# Patient Record
Sex: Female | Born: 1955 | Race: White | Hispanic: No | Marital: Married | Smoking: Former smoker
Health system: Southern US, Community
[De-identification: ages and names within clinical notes are randomized; demographics above are authoritative.]

## PROBLEM LIST (undated history)

## (undated) DIAGNOSIS — O039 Complete or unspecified spontaneous abortion without complication: Secondary | ICD-10-CM

## (undated) DIAGNOSIS — K5190 Ulcerative colitis, unspecified, without complications: Secondary | ICD-10-CM

## (undated) DIAGNOSIS — F32A Depression, unspecified: Secondary | ICD-10-CM

## (undated) DIAGNOSIS — F419 Anxiety disorder, unspecified: Secondary | ICD-10-CM

## (undated) DIAGNOSIS — K5090 Crohn's disease, unspecified, without complications: Secondary | ICD-10-CM

## (undated) DIAGNOSIS — E785 Hyperlipidemia, unspecified: Secondary | ICD-10-CM

## (undated) DIAGNOSIS — F329 Major depressive disorder, single episode, unspecified: Secondary | ICD-10-CM

## (undated) HISTORY — DX: Crohn's disease, unspecified, without complications: K50.90

## (undated) HISTORY — PX: CARDIAC SURGERY: SHX584

## (undated) HISTORY — PX: TUBAL LIGATION: SHX77

## (undated) HISTORY — DX: Major depressive disorder, single episode, unspecified: F32.9

## (undated) HISTORY — DX: Depression, unspecified: F32.A

## (undated) HISTORY — DX: Ulcerative colitis, unspecified, without complications: K51.90

## (undated) HISTORY — DX: Hyperlipidemia, unspecified: E78.5

## (undated) HISTORY — DX: Anxiety disorder, unspecified: F41.9

## (undated) HISTORY — DX: Complete or unspecified spontaneous abortion without complication: O03.9

---
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
## 2004-07-29 ENCOUNTER — Emergency Department: Payer: Self-pay | Admitting: Emergency Medicine

## 2006-07-24 ENCOUNTER — Encounter: Payer: Self-pay | Admitting: Family Medicine

## 2006-07-24 LAB — CONVERTED CEMR LAB
Cholesterol: 223 mg/dL
HDL: 76 mg/dL
LDL Cholesterol: 127 mg/dL
Pap Smear: NORMAL
Triglycerides: 98 mg/dL

## 2007-09-15 ENCOUNTER — Ambulatory Visit: Payer: Self-pay | Admitting: Family Medicine

## 2007-09-15 ENCOUNTER — Encounter: Admission: RE | Admit: 2007-09-15 | Discharge: 2007-09-15 | Payer: Self-pay | Admitting: Family Medicine

## 2007-09-20 ENCOUNTER — Encounter: Payer: Self-pay | Admitting: Family Medicine

## 2007-09-20 DIAGNOSIS — F411 Generalized anxiety disorder: Secondary | ICD-10-CM | POA: Insufficient documentation

## 2007-09-20 DIAGNOSIS — G4700 Insomnia, unspecified: Secondary | ICD-10-CM | POA: Insufficient documentation

## 2007-12-28 ENCOUNTER — Ambulatory Visit: Payer: Self-pay | Admitting: Family Medicine

## 2008-06-10 ENCOUNTER — Ambulatory Visit: Payer: Self-pay | Admitting: Family Medicine

## 2009-04-25 ENCOUNTER — Ambulatory Visit: Payer: Self-pay | Admitting: Family Medicine

## 2009-04-25 DIAGNOSIS — Z780 Asymptomatic menopausal state: Secondary | ICD-10-CM | POA: Insufficient documentation

## 2009-04-26 LAB — CONVERTED CEMR LAB
BUN: 14 mg/dL (ref 6–23)
CO2: 25 meq/L (ref 19–32)
Calcium: 10.1 mg/dL (ref 8.4–10.5)
Chloride: 97 meq/L (ref 96–112)
Cholesterol: 226 mg/dL — ABNORMAL HIGH (ref 0–200)
Creatinine, Ser: 0.75 mg/dL (ref 0.40–1.20)
HDL: 80 mg/dL (ref >39)
Total Bilirubin: 0.7 mg/dL (ref 0.3–1.2)
Total CHOL/HDL Ratio: 2.8
Triglycerides: 50 mg/dL (ref <150)
VLDL: 10 mg/dL (ref 0–40)

## 2009-04-30 ENCOUNTER — Encounter: Admission: RE | Admit: 2009-04-30 | Discharge: 2009-04-30 | Payer: Self-pay | Admitting: Family Medicine

## 2009-06-14 ENCOUNTER — Encounter: Payer: Self-pay | Admitting: Family Medicine

## 2009-07-11 ENCOUNTER — Encounter: Payer: Self-pay | Admitting: Family Medicine

## 2009-07-11 LAB — HM COLONOSCOPY

## 2009-07-30 ENCOUNTER — Ambulatory Visit: Payer: Self-pay | Admitting: Family Medicine

## 2009-07-30 DIAGNOSIS — K5090 Crohn's disease, unspecified, without complications: Secondary | ICD-10-CM | POA: Insufficient documentation

## 2009-07-30 DIAGNOSIS — R519 Headache, unspecified: Secondary | ICD-10-CM | POA: Insufficient documentation

## 2009-07-30 DIAGNOSIS — R51 Headache: Secondary | ICD-10-CM | POA: Insufficient documentation

## 2009-08-02 ENCOUNTER — Ambulatory Visit: Payer: Self-pay | Admitting: Family Medicine

## 2009-08-20 ENCOUNTER — Encounter: Payer: Self-pay | Admitting: Family Medicine

## 2009-09-28 ENCOUNTER — Encounter: Payer: Self-pay | Admitting: Family Medicine

## 2009-10-29 ENCOUNTER — Encounter: Payer: Self-pay | Admitting: Family Medicine

## 2009-10-30 HISTORY — PX: EYE SURGERY: SHX253

## 2010-02-26 ENCOUNTER — Encounter: Payer: Self-pay | Admitting: Family Medicine

## 2010-04-05 ENCOUNTER — Ambulatory Visit: Payer: Self-pay | Admitting: Family Medicine

## 2010-04-05 DIAGNOSIS — H8110 Benign paroxysmal vertigo, unspecified ear: Secondary | ICD-10-CM | POA: Insufficient documentation

## 2010-04-29 ENCOUNTER — Ambulatory Visit: Payer: Self-pay | Admitting: Family Medicine

## 2010-04-30 LAB — CONVERTED CEMR LAB
Albumin: 4.7 g/dL (ref 3.5–5.2)
Alkaline Phosphatase: 75 units/L (ref 39–117)
CO2: 28 meq/L (ref 19–32)
Calcium: 9.6 mg/dL (ref 8.4–10.5)
Chloride: 99 meq/L (ref 96–112)
Cholesterol: 179 mg/dL (ref 0–200)
Glucose, Bld: 91 mg/dL (ref 70–99)
LDL Cholesterol: 102 mg/dL — ABNORMAL HIGH (ref 0–99)
Potassium: 4.3 meq/L (ref 3.5–5.3)
Sodium: 138 meq/L (ref 135–145)
Total Protein: 7.4 g/dL (ref 6.0–8.3)
Triglycerides: 61 mg/dL (ref <150)

## 2010-05-03 ENCOUNTER — Telehealth: Payer: Self-pay | Admitting: Family Medicine

## 2010-05-08 ENCOUNTER — Encounter: Admission: RE | Admit: 2010-05-08 | Discharge: 2010-05-08 | Payer: Self-pay | Admitting: Family Medicine

## 2010-07-07 ENCOUNTER — Encounter: Payer: Self-pay | Admitting: Family Medicine

## 2010-07-14 LAB — CONVERTED CEMR LAB
ALT: 13 units/L (ref 0–35)
AST: 20 units/L (ref 0–37)
Basophils Absolute: 0 10*3/uL (ref 0.0–0.1)
Basophils Relative: 1 % (ref 0–1)
CO2: 26 meq/L (ref 19–32)
Calcium: 9.8 mg/dL (ref 8.4–10.5)
Chloride: 97 meq/L (ref 96–112)
Creatinine, Ser: 0.75 mg/dL (ref 0.40–1.20)
Glucose, Urine, Semiquant: NEGATIVE
Hemoglobin: 13.5 g/dL (ref 12.0–15.0)
Lymphocytes Relative: 34 % (ref 12–46)
MCHC: 32.8 g/dL (ref 30.0–36.0)
Monocytes Absolute: 0.5 10*3/uL (ref 0.1–1.0)
Neutro Abs: 2.2 10*3/uL (ref 1.7–7.7)
Nitrite: NEGATIVE
Platelets: 230 10*3/uL (ref 150–400)
RDW: 12.9 % (ref 11.5–15.5)
Sodium: 136 meq/L (ref 135–145)
Specific Gravity, Urine: 1.01
Total Bilirubin: 0.7 mg/dL (ref 0.3–1.2)
Total Protein: 7.3 g/dL (ref 6.0–8.3)
WBC Urine, dipstick: NEGATIVE
pH: 6.5

## 2010-07-18 NOTE — Progress Notes (Signed)
Summary: Pt canceled nutrition referral.   ---- Converted from flag ---- ---- 05/03/2010 2:44 PM, Otho Ket wrote: Pt called and wants to CANCEL her referral for Nutrition... Thanks, Anderson Malta ------------------------------

## 2010-07-18 NOTE — Letter (Signed)
Summary: [facility]   Imported By: Edmonia James 11/05/2009 13:13:20  _____________________________________________________________________  External Attachment:    Type:   Image     Comment:   External Document

## 2010-07-18 NOTE — Letter (Signed)
Summary: [facility]   Imported By: Edmonia James 10/11/2009 09:59:59  _____________________________________________________________________  External Attachment:    Type:   Image     Comment:   External Document

## 2010-07-18 NOTE — Letter (Signed)
Summary: Meade Maw MD  Meade Maw MD   Imported By: Edmonia James 12/07/2009 08:02:42  _____________________________________________________________________  External Attachment:    Type:   Image     Comment:   External Document

## 2010-07-18 NOTE — Assessment & Plan Note (Signed)
Summary: HAs   Vital Signs:  Patient profile:   55 year old female Height:      69 inches Weight:      129 pounds BMI:     19.12 O2 Sat:      100 % on Room air Temp:     98.0 degrees F oral Pulse rate:   69 / minute BP sitting:   106 / 63  (left arm) Cuff size:   regular  Vitals Entered By: Sherlean Foot CMA (July 30, 2009 8:30 AM)  O2 Flow:  Room air CC: HA's since being Dx'd w/ Crohns   Primary Care Provider:  Beatrice Lecher MD  CC:  HA's since being Dx'd w/ Crohns.  History of Present Illness: 55 yo WF presents for HA with lightheadedness and nausea. Nausea started this am, not noticed before.  She is taking Tylenol 1 gram every 6 hrs which helps some. She took Flexeril last night d/t neck discomfort.  She is on an a new medicine for Crohns since her colonoscopy.  The HAs started right after the colonoscopy.   She has some pain with neck ROM.  No numbness or tingling.  She used to see a chiropractor for neck pain years ago.  No previous hx of migraines.  She is trying to drink plenty of water.  She had some fevers, chills three days ago which caused her to leave work early. Has the dry heaves.  Her stools are still bloody, lose and mucously.        Current Medications (verified): 1)  Fish Oil 1200 Mg  Caps (Omega-3 Fatty Acids) .... Take Two By Mouth Daily 2)  Caltrate 600+d 600-400 Mg-Unit  Tabs (Calcium Carbonate-Vitamin D) .... Take Two By Mouth Daily 3)  B Complex   Caps (B Complex Vitamins) .... Take 1 Tablet By Mouth Once A Day 4)  Lialda 1.2 Gm Tbec (Mesalamine) .... Take 2 Tabs By Mouth Once Daily  Allergies (verified): No Known Drug Allergies  Past History:  Past Medical History: Elevated cholesterol One miscarriage.  Anxiety/derpression - On Prozac previously  Dr Laverta Baltimore - GI  Family History: Reviewed history from 06/10/2008 and no changes required. mother - alive with memory problems and abdominal problems father - alive - vertigo sister  alive and healthy  Social History: Reviewed history from 04/25/2009 and no changes required. [place]. Married to [employer] with 3 adult children.  Lives with her husband.  Former Nurse, mental health, quit at age 32.  Alcohol use-yes 7 drinks a week, wine.   Drug use-no Regular exercise-no  Review of Systems      See HPI  Physical Exam  General:  In no acute distress; appropriate and cooperative throughout examination, appears fatigued. Head:  normocephalic and atraumatic.   Eyes:  sclera non icteric Nose:  no nasal discharge.   Mouth:  good dentition and pharynx pink and moist.   Neck:  limited active SB and rotation  Lungs:  Normal respiratory effort, chest expands symmetrically. Lungs are clear to auscultation, no crackles or wheezes. Heart:  Normal rate and regular rhythm. Abdomen:  abdomen soft and non-tender, no HSM.  NABS Pulses:  2+ radial pulses Extremities:  no E/C/C Neurologic:  alert & oriented X3 and gait normal.   Skin:  slight facial pallor.  no jaundice Cervical Nodes:  No lymphadenopathy noted Psych:  flat affect.     Impression & Recommendations:  Problem # 1:  HEADACHE (ICD-784.0) Assessment New 3 wks of  HA that began with mesalamine use.  Likely to be SE from the medicine.  She has associated nausea that may be from the HA, the medicine or a viral illness.  Will treat her symptoms w/ odansetron, lots of fluids and Tylenol as needed.  Try not to exceed 3 g/ day.  If not improved in 72 hrs, suggest she call Dr Laverta Baltimore (GI) to discuss this SE.     Ondansteron as needed for nausea. Diazepram as needed at night for neck pain.  Problem # 2:  CROHN'S DISEASE (ICD-555.9) New diagnosis. Sees Dr. Laverta Baltimore (GI) Started on Lialda 3 weeks ago following Colonoscopy. Experiencing diarrhea and nausea. Has mucous and blood in stools.  Clear liquid diet today. Advance foods slowly, bland diet. Will f/u with Dr. Laverta Baltimore after Thursday regarding possible side  effects of Lialda.   Complete Medication List: 1)  Fish Oil 1200 Mg Caps (Omega-3 fatty acids) .... Take two by mouth daily 2)  Caltrate 600+d 600-400 Mg-unit Tabs (Calcium carbonate-vitamin d) .... Take two by mouth daily 3)  B Complex Caps (B complex vitamins) .... Take 1 tablet by mouth once a day 4)  Lialda 1.2 Gm Tbec (Mesalamine) .... Take 2 tabs by mouth once daily 5)  Ondansetron 8 Mg Tbdp (Ondansetron) .Marland Kitchen.. 1 tab by mouth three times a day as needed nausea 6)  Diazepam 2 Mg Tabs (Diazepam) .Marland Kitchen.. 1-2 tabs by mouth q hs as needed neck pain  Patient Instructions: 1)  Use Odansetron as needed for nausea. 2)  Stick to a clear liquid diet today.  Advance slowly as nausea improves. 3)  Use Diazepam for neck pain at night.  It will make you drowsy. 4)  Call GI doctor if Headaches are not resolved by Thursday.  Likely to be a medication side effect. Prescriptions: DIAZEPAM 2 MG TABS (DIAZEPAM) 1-2 tabs by mouth q hs as needed neck pain  #12 x 0   Entered and Authorized by:   Loyal Gambler DO   Signed by:   Loyal Gambler DO on 07/30/2009   Method used:   Printed then faxed to ...       [facility] [phone]* (retail)       [place], [place]  [address]       Ph: [phone]       Fax: [phone]   RxID:   [id number] ONDANSETRON 8 MG TBDP (ONDANSETRON) 1 tab by mouth three times a day as needed nausea  #24 x 0   Entered and Authorized by:   Loyal Gambler DO   Signed by:   Loyal Gambler DO on 07/30/2009   Method used:   Electronically to        [facility] [phone]* (retail)       [place], [place]  [address]       Ph: [phone]       Fax: [phone]   RxID:   [id number]

## 2010-07-18 NOTE — Letter (Signed)
Summary: [facility]   Imported By: Edmonia James 03/07/2010 12:09:15  _____________________________________________________________________  External Attachment:    Type:   Image     Comment:   External Document

## 2010-07-18 NOTE — Assessment & Plan Note (Signed)
Summary: CPE   Vital Signs:  Patient profile:   55 year old female Height:      69 inches Weight:      121 pounds Pulse rate:   91 / minute BP sitting:   115 / 59  (right arm) Cuff size:   regular  Vitals Entered By: Isaias Cowman CMA, Deborra Medina) (April 29, 2010 8:22 AM)  Contraindications/Deferment of Procedures/Staging:    Test/Procedure: FLU VAX    Reason for deferment: patient declined  CC: CPE, C-V Risk Management   Primary Care Kendarious Gudino:  Beatrice Lecher MD  CC:  CPE and C-V Risk Management.  History of Present Illness: Here for CPE. She wouldl like a nutrition referral for her recent weight loss with her crohns hx  Says had a normal EKG at the ED in May 2011.  Had a Tdap whiel there. Was there after her BP dropped too low from meds she was taking post eye surgery.   Cardiovascular Risk History:      Negative major cardiovascular risk factors include female age less than 56 years old, no history of diabetes, no history of hypertension, negative family history for ischemic heart disease, and non-tobacco-user status.        Further assessment for target organ damage reveals no history of ASHD, stroke/TIA, or peripheral vascular disease.    Current Medications (verified): 1)  Fish Oil 1200 Mg  Caps (Omega-3 Fatty Acids) .... Take Two By Mouth Daily 2)  Caltrate 600+d 600-400 Mg-Unit  Tabs (Calcium Carbonate-Vitamin D) .... Take Two By Mouth Daily 3)  B Complex   Caps (B Complex Vitamins) .... Take 1 Tablet By Mouth Once A Day 4)  Lialda 1.2 Gm Tbec (Mesalamine) .... Take 2 Tabs By Mouth Once Daily 5)  Promethazine Hcl 25 Mg Tabs (Promethazine Hcl) .... One By Mouth Every 4 Hours As Needed Nausea.  Allergies (verified): No Known Drug Allergies  Comments:  Nurse/Medical Assistant: The patient's medications and allergies were reviewed with the patient and were updated in the Medication and Allergy Lists. Isaias Cowman CMA, Deborra Medina) (April 29, 2010 8:22  AM)  Past History:  Family History: Last updated: 06/10/2008 mother - alive with memory problems and abdominal problems father - alive - vertigo sister alive and healthy  Social History: Last updated: 04/25/2009 [place]. Married to [employer] with 3 adult children.  Lives with her husband.  Former Nurse, mental health, quit at age 81.  Alcohol use-yes 7 drinks a week, wine.   Drug use-no Regular exercise-no  Past Medical History: Elevated cholesterol One miscarriage.  Anxiety/derpression - On Prozac previously Crohns Dx Dr Laverta Baltimore - GI  Past Surgical History: Repair of ?ASD 10/1962 tonsillectomy 1965 D & C after miscarriage in 1982 Repair of detached retina surgery, left eye 10/30/2009  Review of Systems  The patient denies anorexia, fever, weight loss, weight gain, vision loss, decreased hearing, hoarseness, chest pain, syncope, dyspnea on exertion, peripheral edema, prolonged cough, headaches, hemoptysis, abdominal pain, melena, hematochezia, severe indigestion/heartburn, hematuria, incontinence, genital sores, muscle weakness, suspicious skin lesions, transient blindness, difficulty walking, depression, unusual weight change, abnormal bleeding, enlarged lymph nodes, and breast masses.    Physical Exam  General:  Well-developed,well-nourished,in no acute distress; alert,appropriate and cooperative throughout examination Head:  Normocephalic and atraumatic without obvious abnormalities. No apparent alopecia or balding. Eyes:  No corneal or conjunctival inflammation noted. EOMI. Perrla.  Ears:  External ear exam shows no significant lesions or deformities.  Otoscopic examination reveals clear canals, tympanic membranes  are intact bilaterally without bulging, retraction, inflammation or discharge. Hearing is grossly normal bilaterally. Nose:  External nasal examination shows no deformity or inflammation.  Mouth:  Oral mucosa and oropharynx without lesions or  exudates.  Teeth in good repair. Neck:  No deformities, masses, or tenderness noted. Chest Wall:  Prominant left chest rib.   Breasts:  No mass, nodules, thickening, tenderness, bulging, retraction, inflamation, nipple discharge or skin changes noted.   Lungs:  Normal respiratory effort, chest expands symmetrically. Lungs are clear to auscultation, no crackles or wheezes. Heart:  Normal rate and regular rhythm. S1 and S2 normal without gallop, murmur, click, rub or other extra sounds. Abdomen:  Bowel sounds positive,abdomen soft and non-tender without masses, organomegaly or hernias noted. Msk:  No deformity or scoliosis noted of thoracic or lumbar spine.   Pulses:  R and L carotid,radial,dorsalis pedis and posterior tibial pulses are full and equal bilaterally Extremities:  No clubbing, cyanosis, edema, or deformity noted with normal full range of motion of all joints.   Neurologic:  No cranial nerve deficits noted. Station and gait are normal. DTRs are symmetrical throughout. Sensory, motor and coordinative functions appear intact. Skin:  Insicion over chest. no rashes.   Cervical Nodes:  No lymphadenopathy noted Axillary Nodes:  No palpable lymphadenopathy Psych:  Cognition and judgment appear intact. Alert and cooperative with normal attention span and concentration. No apparent delusions, illusions, hallucinations   Impression & Recommendations:  Problem # 1:  EXAMINATION, ROUTINE MEDICAL (ICD-V70.0) Examis normal. See orders below. She is very think and will arrange a nutritionist to work with her with her diet and her crohns.  Encourage regular exercise adn healthy diet.  Encourage daily calcium.  Orders: T-Mammography Bilateral Screening (86761) T-Dual DXA Bone Density/ Axial (95093) T-Comprehensive Metabolic Panel (26712-45809) T-Lipid Profile (98338-25053) T-TSH (97673-41937)  Complete Medication List: 1)  Fish Oil 1200 Mg Caps (Omega-3 fatty acids) .... Take two by mouth  daily 2)  Caltrate 600+d 600-400 Mg-unit Tabs (Calcium carbonate-vitamin d) .... Take two by mouth daily 3)  B Complex Caps (B complex vitamins) .... Take 1 tablet by mouth once a day 4)  Lialda 1.2 Gm Tbec (Mesalamine) .... Take 2 tabs by mouth once daily 5)  Promethazine Hcl 25 Mg Tabs (Promethazine hcl) .... One by mouth every 4 hours as needed nausea.  Other Orders: Nutrition Referral (Nutrition)  Cardiovascular Risk Assessment/Plan:      The patient's hypertensive risk group is category A: No risk factors and no target organ damage.  Her calculated 10 year risk of coronary heart disease is 2 %.  Today's blood pressure is 115/59.     Patient Instructions: 1)  WE will call you with your lab results.  2)  We will call you with your nutrition referral.  3)  It is important that you exercise reguarly at least 20 minutes 5 times a week. If you develop chest pain, have severe difficulty breathing, or feel very tired, stop exercising immediately and seek medical attention.  4)  Take calcium +vitamin D daily.    Orders Added: 1)  T-Mammography Bilateral Screening [90240] 2)  T-Dual DXA Bone Density/ Axial [77080] 3)  T-Comprehensive Metabolic Panel [97353-29924] 4)  T-Lipid Profile [80061-22930] 5)  T-TSH [26834-19622] 6)  Nutrition Referral [Nutrition] 7)  Est. Patient age 86-64 [99396]   Immunization History:  Tetanus/Td Immunization History:    Tetanus/Td:  tdap (state) (10/31/2009)   Immunization History:  Tetanus/Td Immunization History:    Tetanus/Td:  Tdap Forensic scientist) (  10/31/2009)   Immunization History:  Tetanus/Td Immunization History:    Tetanus/Td:  tdap (state) (10/31/2009)

## 2010-07-18 NOTE — Consult Note (Signed)
Summary: [facility]   Imported By: Edmonia James 06/22/2009 08:26:32  _____________________________________________________________________  External Attachment:    Type:   Image     Comment:   External Document  Appended Document: Ailene Rud  Flex Sig Next Due:  Not Indicated Colonoscopy Result Date:  07/11/2009 Colonoscopy Result:  abnormal Hemoccult Next Due:  Not Indicated

## 2010-07-18 NOTE — Letter (Signed)
Summary: [facility]   Imported By: Edmonia James 08/30/2009 12:08:53  _____________________________________________________________________  External Attachment:    Type:   Image     Comment:   External Document

## 2010-07-18 NOTE — Procedures (Signed)
Summary: Colonoscopy/[facility]  Colonoscopy/[facility]   Imported By: Edmonia James 07/19/2009 09:56:37  _____________________________________________________________________  External Attachment:    Type:   Image     Comment:   External Document

## 2010-07-18 NOTE — Assessment & Plan Note (Signed)
Summary: GI symptoms/ Crohns   Vital Signs:  Patient profile:   55 year old female Height:      69 inches Weight:      127 pounds BMI:     18.82 O2 Sat:      98 % on Room air Temp:     98.2 degrees F oral Pulse rate:   76 / minute BP sitting:   110 / 60  (right arm) Cuff size:   regular  Vitals Entered By: Era Skeen (August 02, 2009 9:26 AM)  O2 Flow:  Room air CC: f/u    Primary Care Provider:  Beatrice Lecher MD  CC:  f/u .  History of Present Illness: Ms. Trabert is a 55 year old female with Chron's disease who started taking Lialda two weeks ago. After starting the medicine she began having headaches that gradually worsened until last Friday when she started having chills and fever to 102. Over the weekend she had HA, body aches, nausea, and vomiting on Monday. She stopped taking her Lialda yesterday and today feels much better with only mild muscular back and chest pain with deep inspiration. No rhinorrhea, nasal congestion, sore throat. She did have one episode of pink-tinged diarrhea this morning, which is normal for her.   Current Medications (verified): 1)  Fish Oil 1200 Mg  Caps (Omega-3 Fatty Acids) .... Take Two By Mouth Daily 2)  Caltrate 600+d 600-400 Mg-Unit  Tabs (Calcium Carbonate-Vitamin D) .... Take Two By Mouth Daily 3)  B Complex   Caps (B Complex Vitamins) .... Take 1 Tablet By Mouth Once A Day 4)  Lialda 1.2 Gm Tbec (Mesalamine) .... Take 2 Tabs By Mouth Once Daily 5)  Ondansetron 8 Mg Tbdp (Ondansetron) .Marland Kitchen.. 1 Tab By Mouth Three Times A Day As Needed Nausea 6)  Diazepam 2 Mg Tabs (Diazepam) .Marland Kitchen.. 1-2 Tabs By Mouth Q Hs As Needed Neck Pain  Allergies (verified): No Known Drug Allergies  Past History:  Past Medical History: Elevated cholesterol One miscarriage.  Anxiety/derpression - On Prozac previously Crohns Dr Laverta Baltimore - GI  Past Surgical History: Reviewed history from 04/25/2009 and no changes required. Repair of ?ASD  10/1962 tonsillectomy 1965 D & C after miscarriage in 1982  Social History: Reviewed history from 04/25/2009 and no changes required. [place]. Married to [employer] with 3 adult children.  Lives with her husband.  Former Nurse, mental health, quit at age 59.  Alcohol use-yes 7 drinks a week, wine.   Drug use-no Regular exercise-no  Review of Systems      See HPI  Physical Exam  General:  Alert, well-developed, well-nourished, and well-hydrated.  Head:  Normocephalic and atraumatic.   Eyes:  No scleral or conjunctival inflammation noted.  Nose:  No rhinorrhea. Mouth:  Moist mucus membranes. Oropharynx clear without injection or exudate.  Lungs:  Clear to auscultation bilaterally with no wheezes or crackles. Normal work of breathing.  Heart:  Sternotomy scar. Normal rate and regular rhythm. S1 and S2 normal without murmur, rub, or gallop. Abdomen:  Soft, nontender, nondistended. Normoactive bowel sounds. No HSM.  Pulses:  2+ bilaterally. Extremities:  Warm and well-perfused with no edema.  Skin:  color normal.   Psych:  Alert and interacting appropriately with normal attention and concentration.    Impression & Recommendations:  Problem # 1:  GASTROENTERITIS, ACUTE (ICD-558.9) Resolved.  Well hydrated.  Odansetron did help.    Her updated medication list for this problem includes:    Ondansetron 8 Mg Tbdp (  Ondansetron) .Marland Kitchen... 1 tab by mouth three times a day as needed nausea  Problem # 2:  CROHN'S DISEASE (ICD-555.9) She agrees to call Dr Laverta Baltimore back about SE from Mesalamine: daily HAs and bodyaches. Printed RX for Prednisone pack just in case her is pulled off the Mesalamine and her crohns flares up and her appt is not until 3-7.    Complete Medication List: 1)  Fish Oil 1200 Mg Caps (Omega-3 fatty acids) .... Take two by mouth daily 2)  Caltrate 600+d 600-400 Mg-unit Tabs (Calcium carbonate-vitamin d) .... Take two by mouth daily 3)  B Complex Caps (B complex  vitamins) .... Take 1 tablet by mouth once a day 4)  Lialda 1.2 Gm Tbec (Mesalamine) .... Take 2 tabs by mouth once daily 5)  Ondansetron 8 Mg Tbdp (Ondansetron) .Marland Kitchen.. 1 tab by mouth three times a day as needed nausea 6)  Diazepam 2 Mg Tabs (Diazepam) .Marland Kitchen.. 1-2 tabs by mouth q hs as needed neck pain 7)  Prednisone (pak) 10 Mg Tabs (Prednisone) .... Take for 12 days as directed  Patient Instructions: 1)  Call Dr Laverta Baltimore back.  Tell him that you had a stomache virus over the weekend which has resolved.  Headaches and bodyaches continue on the Mesalamine even after virus has resolved. 2)  I will print Rx for Prednisone to start just in case you come off the mesalamine and crohns flares up prior to your f/u appt with GI. Prescriptions: PREDNISONE (PAK) 10 MG TABS (PREDNISONE) take for 12 days as directed  #1 pack x 0   Entered and Authorized by:   Loyal Gambler DO   Signed by:   Loyal Gambler DO on 08/02/2009   Method used:   Print then Give to Patient   RxID:   [id number]

## 2010-07-18 NOTE — Assessment & Plan Note (Signed)
Summary: Vertigo   Vital Signs:  Patient profile:   55 year old female Height:      69 inches Weight:      118 pounds Pulse rate:   72 / minute BP sitting:   111 / 66  (right arm) Cuff size:   regular  Vitals Entered By: Isaias Cowman CMA, Deborra Medina) (April 05, 2010 9:59 AM) CC: dizziness and lightheaded since early this am   Primary Care Provider:  Beatrice Lecher MD  CC:  dizziness and lightheaded since early this am.  History of Present Illness: dizziness and lightheaded since early this am.  Woke up at 2AM anf felt lighthead and nauseated. Then when got up this morning felt like the room was spinning and nausea.  took some old phenergan and that worked. spinning is horizaont. No ear pain or pressure. No fever or recent URI. .   Current Medications (verified): 1)  Fish Oil 1200 Mg  Caps (Omega-3 Fatty Acids) .... Take Two By Mouth Daily 2)  Caltrate 600+d 600-400 Mg-Unit  Tabs (Calcium Carbonate-Vitamin D) .... Take Two By Mouth Daily 3)  B Complex   Caps (B Complex Vitamins) .... Take 1 Tablet By Mouth Once A Day 4)  Lialda 1.2 Gm Tbec (Mesalamine) .... Take 2 Tabs By Mouth Once Daily 5)  Promethazine Hcl 25 Mg Tabs (Promethazine Hcl)  Allergies (verified): No Known Drug Allergies  Comments:  Nurse/Medical Assistant: The patient's medications and allergies were reviewed with the patient and were updated in the Medication and Allergy Lists. Condon, Deborra Medina) (April 05, 2010 10:01 AM)  Past History:  Past Medical History: Last updated: 08/02/2009 Elevated cholesterol One miscarriage.  Anxiety/derpression - On Prozac previously Crohns Dr [facility] [place] - [facility]  Past Surgical History: Repair of ?ASD 10/1962 tonsillectomy 1965 D & C after miscarriage in 1982 Repari of detached retina surgery.   Physical Exam  General:  Well-developed,well-nourished,in no acute distress; alert,appropriate and cooperative throughout examination Head:  Normocephalic and  atraumatic without obvious abnormalities. No apparent alopecia or balding. Eyes:  No corneal or conjunctival inflammation noted. EOMI. Perrla.  Ears:  External ear exam shows no significant lesions or deformities.  Otoscopic examination reveals clear canals, tympanic membranes are intact bilaterally without bulging, retraction, inflammation or discharge. Hearing is grossly normal bilaterally. Right canal has a large amout of wax but I am able to see part of the TM which looks normal.  Nose:  External nasal examination shows no deformity or inflammation.  Mouth:  Oral mucosa and oropharynx without lesions or exudates.  Teeth in good repair. Neck:  No deformities, masses, or tenderness noted. Lungs:  Normal respiratory effort, chest expands symmetrically. Lungs are clear to auscultation, no crackles or wheezes. Heart:  Normal rate and regular rhythm. S1 and S2 normal without gallop, murmur, click, rub or other extra sounds. Pulses:  Radial 2+  Neurologic:  alert & oriented X3, cranial nerves II-XII intact, gait normal, DTRs symmetrical and normal, and finger-to-nose normal.  Alternating hand movements are horma. DiksHallpike induced nausea and some dizziness to the left but unalbe to appreciate any nystagumus.    Impression & Recommendations:  Problem # 1:  BENIGN POSITIONAL VERTIGO (ICD-386.11) Discussed mostl ikel dx of BPV based on hx and exam. Given exercises to do at home. REcommend meclizine for sxs. Did refill her phenergan as well. If not better in 2 weeks then let me know. Or if new sxs.  The following medications were removed from the medication list:  Ondansetron 8 Mg Tbdp (Ondansetron) .Marland Kitchen... 1 tab by mouth three times a day as needed nausea Her updated medication list for this problem includes:    Promethazine Hcl 25 Mg Tabs (Promethazine hcl) ..... One by mouth every 4 hours as needed nausea.  Complete Medication List: 1)  Fish Oil 1200 Mg Caps (Omega-3 fatty acids) .... Take two  by mouth daily 2)  Caltrate 600+d 600-400 Mg-unit Tabs (Calcium carbonate-vitamin d) .... Take two by mouth daily 3)  B Complex Caps (B complex vitamins) .... Take 1 tablet by mouth once a day 4)  Lialda 1.2 Gm Tbec (Mesalamine) .... Take 2 tabs by mouth once daily 5)  Promethazine Hcl 25 Mg Tabs (Promethazine hcl) .... One by mouth every 4 hours as needed nausea.  Patient Instructions: 1)  Exercises for vertigo 2)  Meclizine for symptoms of the vertigo.  3)  Call if not better in the 2-3 weeks.  Prescriptions: PROMETHAZINE HCL 25 MG TABS (PROMETHAZINE HCL) one by mouth every 4 hours as needed nausea.  #20 x 0   Entered and Authorized by:   Beatrice Lecher MD   Signed by:   Beatrice Lecher MD on 04/05/2010   Method used:   Electronically to        [facility] [phone]* (retail)       [place], [place]  [address]       Ph: [phone]       Fax: [phone]   RxID:   [id number]    Orders Added: 1)  Est. Patient Level IV [38453]

## 2010-10-03 ENCOUNTER — Other Ambulatory Visit: Payer: Self-pay | Admitting: *Deleted

## 2010-10-03 MED ORDER — ALENDRONATE SODIUM 70 MG PO TABS: 70.0000 mg | ORAL_TABLET | ORAL | Status: DC

## 2010-10-10 ENCOUNTER — Encounter: Payer: Self-pay | Admitting: Family Medicine

## 2010-10-10 ENCOUNTER — Ambulatory Visit (INDEPENDENT_AMBULATORY_CARE_PROVIDER_SITE_OTHER): Payer: 59 | Admitting: Family Medicine

## 2010-10-10 DIAGNOSIS — K5090 Crohn's disease, unspecified, without complications: Secondary | ICD-10-CM

## 2010-10-10 DIAGNOSIS — M810 Age-related osteoporosis without current pathological fracture: Secondary | ICD-10-CM | POA: Insufficient documentation

## 2010-10-10 NOTE — Progress Notes (Signed)
  Subjective:    Patient ID: Diane Chavez, female    DOB: 24-Feb-1956, 55 y.o.   MRN: [id number]  HPI Wants to review her bone denisty test. She is taking calcium with vit D. Not sure what her vitamin D level is in the capsules.  She is now taking 1000units dailly. Getting regular exercise. She is at risk with her hx of Crohns, white, thin.  No family hx though.     Review of Systems     Objective:   Physical Exam  Constitutional: She appears well-developed and well-nourished.  HENT:  Head: Normocephalic and atraumatic.  Psychiatric: She has a normal mood and affect.          Assessment & Plan:

## 2010-10-10 NOTE — Assessment & Plan Note (Signed)
We discussed her dx. Reviewed her report with her and gave her a copy. Also discussed diet, exercise, calcium and vitamin D. Will check Vit D level to see if needs to inc dose to 2000iu.  20 min spent face to face in counseling.  Recheck DEXA in 2 years.

## 2010-10-11 ENCOUNTER — Telehealth: Payer: Self-pay | Admitting: Family Medicine

## 2010-10-11 LAB — VITAMIN B12: Vitamin B-12: 991 pg/mL — ABNORMAL HIGH (ref 211–911)

## 2010-10-11 LAB — VITAMIN D 25 HYDROXY (VIT D DEFICIENCY, FRACTURES): Vit D, 25-Hydroxy: 40 ng/mL (ref 30–89)

## 2010-10-11 NOTE — Telephone Encounter (Signed)
Call pt: B12 is high. If taking an extra supplement can discontinue. Vitamin D and magnesium look great.

## 2010-10-15 NOTE — Telephone Encounter (Signed)
Pt.notified

## 2010-11-05 ENCOUNTER — Encounter: Payer: Self-pay | Admitting: Emergency Medicine

## 2010-11-05 ENCOUNTER — Inpatient Hospital Stay (INDEPENDENT_AMBULATORY_CARE_PROVIDER_SITE_OTHER)
Admission: RE | Admit: 2010-11-05 | Discharge: 2010-11-05 | Disposition: A | Discharge status: Home / Self Care | Payer: 59 | Source: Ambulatory Visit | Attending: Emergency Medicine | Admitting: Emergency Medicine

## 2010-11-05 DIAGNOSIS — M810 Age-related osteoporosis without current pathological fracture: Secondary | ICD-10-CM | POA: Insufficient documentation

## 2010-11-05 DIAGNOSIS — J069 Acute upper respiratory infection, unspecified: Secondary | ICD-10-CM

## 2010-11-06 ENCOUNTER — Telehealth (INDEPENDENT_AMBULATORY_CARE_PROVIDER_SITE_OTHER): Payer: Self-pay | Admitting: *Deleted

## 2011-01-08 ENCOUNTER — Other Ambulatory Visit: Payer: Self-pay | Admitting: Family Medicine

## 2011-03-03 ENCOUNTER — Ambulatory Visit
Admission: RE | Admit: 2011-03-03 | Discharge: 2011-03-03 | Disposition: A | Discharge status: Home / Self Care | Payer: 59 | Source: Ambulatory Visit | Attending: Family Medicine | Admitting: Family Medicine

## 2011-03-03 ENCOUNTER — Ambulatory Visit (INDEPENDENT_AMBULATORY_CARE_PROVIDER_SITE_OTHER): Payer: 59 | Admitting: Family Medicine

## 2011-03-03 ENCOUNTER — Encounter: Payer: Self-pay | Admitting: Family Medicine

## 2011-03-03 VITALS — BP 107/68 | HR 63 | Wt 123.0 lb

## 2011-03-03 DIAGNOSIS — M545 Low back pain, unspecified: Secondary | ICD-10-CM

## 2011-03-03 MED ORDER — CYCLOBENZAPRINE HCL 10 MG PO TABS: 10.0000 mg | ORAL_TABLET | Freq: Three times a day (TID) | ORAL | Status: DC | PRN

## 2011-03-03 NOTE — Progress Notes (Signed)
  Subjective:    Patient ID: KEERTHANA VANROSSUM, female    DOB: 06-28-1955, 55 y.o.   MRN: [id number]  HPI Started having low back Pain on Labor Day. Went to a bounce house. She thinks she may have caused a compression fracture. Has been to a chiropracter a couple of times. Pain is worse on the LT side. Using muscle relaxer at bedtime.  Worse to sleep on her right side. More sore at the end of the day.  Sore on both RT and LT.  Worse on the RT.  It is some better than it was. Hx of osteoporosis.  Initially pain was more sharp but now a dull ache. She has been avoiding flexing the lumbar spine. Cant take NSAIDs bc of her crohns.    Review of Systems     Objective:   Physical Exam  Musculoskeletal:       Dec flexion, normal extension and rotation right and left.  Dec side bending to the RT compared to the left. Pin in her low back with straight leg raise on the LEFT.  Patellar reflexes 2+ bilat.           Assessment & Plan:  Right Low back pain- Xray to rule out compression fracture. Vs may be more muscle strain and pull. She is feeling better and the muscle relaxer is hleping. Gave her her own rx. She has been using her daughters med.  Will cll with reuslts. H.o. Given for gentle stretches. Can start these if xray is neg   Declined flu vaccine.

## 2011-03-03 NOTE — Patient Instructions (Signed)
We will call you with your xray results.

## 2011-03-04 ENCOUNTER — Telehealth: Payer: Self-pay | Admitting: Family Medicine

## 2011-03-04 NOTE — Telephone Encounter (Signed)
Pt called for xray results. Plan:  Reviewed pt chart, and actually report is available and sitting to be reviewed by the provider.  Pt notified that the provider needs to review, then we will call her with recommendations.  Pt voiced understanding. Morene Rankins, LPN Lynne Logan

## 2011-03-05 ENCOUNTER — Telehealth: Payer: Self-pay | Admitting: Family Medicine

## 2011-03-05 NOTE — Telephone Encounter (Signed)
Pt informed of results. Morene Rankins, LPN Lynne Logan

## 2011-03-05 NOTE — Telephone Encounter (Signed)
Message copied by Ludger Nutting on Wed Mar 05, 2011  2:56 PM ------      Message from: Beatrice Lecher D      Created: Tue Mar 04, 2011  9:35 PM       Mild disc degeneration and spurring at L3-4 and L4-5.  No fracure or mass lesion. We can consider PT if is not improving over the next couple of weeks.

## 2011-05-19 NOTE — Progress Notes (Signed)
Summary: SORE THROAT/SICK   Vital Signs:  Patient Profile:   55 Years Old Female CC:      productive cough, hoarse, congestion Height:     69 inches Weight:      122 pounds O2 Sat:      100 % O2 treatment:    Room Air Temp:     98.8 degrees F oral Pulse rate:   70 / minute Resp:     14 per minute BP sitting:   120 / 55  (left arm) Cuff size:   regular  Pt. in pain?   no  Vitals Entered By: Betti Cruz RN (Nov 05, 2010 8:14 AM)                   Updated Prior Medication List: FISH OIL 1200 MG  CAPS (OMEGA-3 FATTY ACIDS) take two by mouth daily CALTRATE 600+D 600-400 MG-UNIT  TABS (CALCIUM CARBONATE-VITAMIN D) take two by mouth daily B COMPLEX   CAPS (B COMPLEX VITAMINS) Take 1 tablet by mouth once a day LIALDA 1.2 GM TBEC (MESALAMINE) Take 2 tabs by mouth once daily ALENDRONATE SODIUM 70 MG TABS (ALENDRONATE SODIUM) one by mouth once a week. FOSAMAX PLUS D 70-2800 MG-UNIT TABS (ALENDRONATE-CHOLECALCIFEROL)   Current Allergies: No known allergies History of Present Illness History from: patient Chief Complaint: productive cough, hoarse, congestion History of Present Illness: 55 Years Old Female complains of onset of cold symptoms for a few  days.  Kalene has been using OTC cold meds which is helping a little bit. +/- sore throat + cough No pleuritic pain No wheezing + nasal congestion + post-nasal drainage + hoarseness + sinus pain/pressure No chest congestion No itchy/red eyes No earache No hemoptysis No SOB No chills/sweats No fever No nausea No vomiting No abdominal pain No diarrhea No skin rashes No fatigue No myalgias No headache   REVIEW OF SYSTEMS Constitutional Symptoms      Denies fever, chills, night sweats, weight loss, weight gain, and fatigue.  Eyes       Denies change in vision, eye pain, eye discharge, glasses, contact lenses, and eye surgery. Ear/Nose/Throat/Mouth       Complains of sinus problems and hoarseness.      Denies  hearing loss/aids, change in hearing, ear pain, ear discharge, dizziness, frequent runny nose, frequent nose bleeds, sore throat, and tooth pain or bleeding.  Respiratory       Complains of productive cough and bronchitis.      Denies dry cough, wheezing, shortness of breath, asthma, and emphysema/COPD.  Cardiovascular       Denies murmurs, chest pain, and tires easily with exhertion.    Gastrointestinal       Denies stomach pain, nausea/vomiting, diarrhea, constipation, blood in bowel movements, and indigestion. Genitourniary       Denies painful urination, kidney stones, and loss of urinary control. Neurological       Denies paralysis, seizures, and fainting/blackouts. Musculoskeletal       Denies muscle pain, joint pain, joint stiffness, decreased range of motion, redness, swelling, muscle weakness, and gout.  Skin       Denies bruising, unusual mles/lumps or sores, and hair/skin or nail changes.  Psych       Denies mood changes, temper/anger issues, anxiety/stress, speech problems, depression, and sleep problems. Other Comments: bronchitis 2 months ago. increase coughing @ night   Past History:  Past Medical History: Elevated cholesterol One miscarriage.  Anxiety/derpression - On Prozac previously Crohns Dx Dr  [facility] - [facility] Osteoporosis  Family History: Reviewed history from 06/10/2008 and no changes required. mother - alive with memory problems and abdominal problems father - alive - vertigo sister alive and healthy  Social History: Reviewed history from 04/25/2009 and no changes required. [place]. Married to [employer] with 3 adult children.  Lives with her husband.  Former Nurse, mental health, quit at age 5.  Alcohol use-yes 7 drinks a week, wine.   Drug use-no Regular exercise-no Physical Exam General appearance: well developed, well nourished, hoarseness Ears: normal, no lesions or deformities Nasal: mucosa pink, nonedematous, no septal deviation,  turbinates normal Oral/Pharynx: pharyngeal erythema without exudate, uvula midline without deviation Chest/Lungs: no rales, wheezes, or rhonchi bilateral, breath sounds equal without effort Heart: regular rate and  rhythm, no murmur MSE: oriented to time, place, and person Assessment New Problems: OSTEOPOROSIS (ICD-733.00) UPPER RESPIRATORY INFECTION, ACUTE (ICD-465.9)   Patient Education: Patient and/or caregiver instructed in the following: rest, fluids.  Plan New Medications/Changes: ZITHROMAX Z-PAK 250 MG TABS (AZITHROMYCIN) use as directed  #1 x 0, 11/05/2010, Fidela Salisbury MD ZUTRIPRO 60-4-5 MG/5ML SOLN (PSEUDOEPH-CHLORPHEN-HYDROCOD) 5cc q6 hrs as needed for cough  #6oz x 0, 11/05/2010, Fidela Salisbury MD  New Orders: Est. Patient Level IV [20355] Pulse Oximetry (single measurment) [94760] Planning Comments:   1)  Take the prescribed antibiotic as instructed.  Hold Zpak for a few days since this is likely viral. 2)  Use nasal saline solution (over the counter) at least 3 times a day. 3)  Use over the counter decongestants like Zyrtec-D every 12 hours as needed to help with congestion. 4)  Can take tylenol every 6 hours or motrin every 8 hours for pain or fever. 5)  Follow up with your primary doctor  if no improvement in 5-7 days, sooner if increasing pain, fever, or new symptoms.    The patient and/or caregiver has been counseled thoroughly with regard to medications prescribed including dosage, schedule, interactions, rationale for use, and possible side effects and they verbalize understanding.  Diagnoses and expected course of recovery discussed and will return if not improved as expected or if the condition worsens. Patient and/or caregiver verbalized understanding.  Prescriptions: ZITHROMAX Z-PAK 250 MG TABS (AZITHROMYCIN) use as directed  #1 x 0   Entered and Authorized by:   Fidela Salisbury MD   Signed by:   Fidela Salisbury MD on 11/05/2010   Method used:    Print then Give to Patient   RxID:   [id number] ZUTRIPRO 60-4-5 MG/5ML SOLN (PSEUDOEPH-CHLORPHEN-HYDROCOD) 5cc q6 hrs as needed for cough  #6oz x 0   Entered and Authorized by:   Fidela Salisbury MD   Signed by:   Fidela Salisbury MD on 11/05/2010   Method used:   Print then Give to Patient   RxID:   [id number]   Orders Added: 1)  Est. Patient Level IV [89169] 2)  Pulse Oximetry (single measurment) [45038]

## 2011-05-19 NOTE — Telephone Encounter (Signed)
  Phone Note Call from Patient Call back at Home Phone [phone]   Caller: pts husband Diane Chavez) Summary of Call: pts husband called and states that the Zutripro made her very nauseated, she still has a bad cough and a lot of drainage. He would like a cough med without codeine called into [address] in [place] for her.  husbands #  [phone] Initial call taken by: Charna Archer LPN,  Nov 05, 7620 6:33 AM    New/Updated Medications: BENZONATATE 200 MG CAPS (BENZONATATE) One by mouth hs as needed cough Prescriptions: BENZONATATE 200 MG CAPS (BENZONATATE) One by mouth hs as needed cough  #12 x 0   Entered and Authorized by:   Theone Murdoch MD   Signed by:   Theone Murdoch MD on 11/06/2010   Method used:   Electronically to        [facility] [phone]* (retail)       [place], [place]  [address]       Ph: [phone]       Fax: [phone]   RxID:   [id number]  Recommend Mucinex D during the day with plenty of fluids.  Rx for Tessalon at bedtime. Theone Murdoch MD  Nov 06, 2010 8:32 AM  11/06/10- 900am- spoke to the pt and advised her of the above. to call back if she has any other questions or concerns./[name],LPN

## 2011-06-04 ENCOUNTER — Encounter: Payer: Self-pay | Admitting: Family Medicine

## 2011-06-12 ENCOUNTER — Encounter: Payer: Self-pay | Admitting: Family Medicine

## 2011-06-12 ENCOUNTER — Ambulatory Visit (INDEPENDENT_AMBULATORY_CARE_PROVIDER_SITE_OTHER): Payer: 59 | Admitting: Family Medicine

## 2011-06-12 ENCOUNTER — Other Ambulatory Visit (HOSPITAL_COMMUNITY)
Admission: RE | Admit: 2011-06-12 | Discharge: 2011-06-12 | Disposition: A | Discharge status: Home / Self Care | Payer: 59 | Source: Ambulatory Visit | Attending: Family Medicine | Admitting: Family Medicine

## 2011-06-12 VITALS — BP 103/58 | HR 86 | Ht 69.0 in | Wt 124.0 lb

## 2011-06-12 DIAGNOSIS — Z1159 Encounter for screening for other viral diseases: Secondary | ICD-10-CM | POA: Insufficient documentation

## 2011-06-12 DIAGNOSIS — Z01419 Encounter for gynecological examination (general) (routine) without abnormal findings: Secondary | ICD-10-CM | POA: Insufficient documentation

## 2011-06-12 DIAGNOSIS — R0789 Other chest pain: Secondary | ICD-10-CM

## 2011-06-12 NOTE — Patient Instructions (Addendum)
Start a regular exercise program and make sure you are eating a healthy diet Try to eat 4 servings of dairy a day or take a calcium supplement (562m twice a day). Your vaccines are up to date.  We will call you with your lab results. If you don't here from [facility] about a week then please give [facility] call at [phone].

## 2011-06-12 NOTE — Progress Notes (Signed)
Subjective:     Diane Chavez is a 55 y.o. female and is here for a comprehensive physical exam. The patient reports problems - feeling bubbles and pressure in her chest. Worse at night when lays flat. Hx of crohns. Says only last minute or so.  Says will try to calm down and take deep breaths and that helps.  Has been having some heart burn.  Will get a biurning and tightness in the center of her chest yesterday. Used gas-x. .She says peppermint oil helps. Says usually when happens just eats a liquid diet for a few days an dit gets better. Admits her stress level has  Been elevated.   History   Social History  . Marital Status: Married    Spouse Name: [name]    Number of Children: 3  . Years of Education: N/A   Occupational History  . [place] [employer] History Main Topics  . Smoking status: Former Smoker    Quit date: 06/16/1974  . Smokeless tobacco: Not on file  . Alcohol Use: 4.2 oz/week    7 Glasses of wine per week  . Drug Use: No  . Sexually Active:    Other Topics Concern  . Not on file   Social History Narrative   Lives with husband.Regular exercise-no   Health Maintenance  Topic Date Due  . Pap Smear  08/25/1974  . Influenza Vaccine  03/17/2011  . Mammogram  05/09/2011  . Colonoscopy  07/12/2019  . Tetanus/tdap  11/01/2019    The following portions of the patient's history were reviewed and updated as appropriate: allergies, current medications, past family history, past medical history, past social history, past surgical history and problem list.  Review of Systems A comprehensive review of systems was negative.   Objective:    BP 103/58  Pulse 86  Ht 5' 9"  (1.753 m)  Wt 124 lb (56.246 kg)  BMI 18.31 kg/m2  LMP 06/11/2000 BP 103/58  Pulse 86  Ht 5' 9"  (1.753 m)  Wt 124 lb (56.246 kg)  BMI 18.31 kg/m2  LMP 06/11/2000  General Appearance:    Alert, cooperative, no distress, appears stated age  Head:    Normocephalic,  without obvious abnormality, atraumatic  Eyes:    PERRL, conjunctiva/corneas clear, EOM's intact, both eyes  Ears:    Normal TM's and external ear canals, both ears  Nose:   Nares normal, septum midline, mucosa normal, no drainage    or sinus tenderness  Throat:   Lips, mucosa, and tongue normal; teeth and gums normal  Neck:   Supple, symmetrical, trachea midline, no adenopathy;    thyroid:  no enlargement/tenderness/nodules; no carotid   bruit  Back:     Symmetric, no curvature, ROM normal, no CVA tenderness  Lungs:     Clear to auscultation bilaterally, respirations unlabored  Chest Wall:    No tenderness or deformity   Heart:    Regular rate and rhythm, S1 and S2 normal, no murmur, rub   or gallop  Breast Exam:    No tenderness, masses, or nipple abnormality  Abdomen:     Soft, non-tender, bowel sounds active all four quadrants,    no masses, no organomegaly  Genitalia:    Normal female without lesion, discharge or tenderness  Rectal:    Normal tone, normal prostate, no masses or tenderness;   guaiac negative stool  Extremities:   Extremities normal, atraumatic, no cyanosis or edema  Pulses:  2+ and symmetric all extremities. Well healed surgical scar over her sternum and her right scapula  Skin:   Skin color, texture, turgor normal, no rashes or lesions  Lymph nodes:   Cervical, supraclavicular, and axillary nodes normal  Neurologic:   CNII-XII intact, normal strength, sensation and reflexes    throughout     Assessment:    Healthy female exam.      Plan:     See After Visit Summary for Counseling Recommendations  Start a regular exercise program and make sure you are eating a healthy diet Try to eat 4 servings of dairy a day or take a calcium supplement (556m twice a day). Your vaccines are up to date.  Need to get mammo this year.   Will given labslip to check cholesterol etc.  She wants to go every 2 years on her mammo.   ATypical chest pain - discussed likely  secondary to GERD. She wants to try dietary changes first. If not better in one week then try H2 blocker OTC. If still not helping then call the office. EKG shows rate of 55 , NSR, No acute changes. Gave reassurance. Will call with labs.

## 2011-06-13 LAB — CBC
MCH: 31.9 pg (ref 26.0–34.0)
MCV: 92.1 fL (ref 78.0–100.0)
Platelets: 154 10*3/uL (ref 150–400)
RDW: 13.2 % (ref 11.5–15.5)
WBC: 3.6 10*3/uL — ABNORMAL LOW (ref 4.0–10.5)

## 2011-06-14 LAB — LIPID PANEL
Cholesterol: 198 mg/dL (ref 0–200)
HDL: 73 mg/dL (ref >39)
Total CHOL/HDL Ratio: 2.7 Ratio

## 2011-06-14 LAB — COMPLETE METABOLIC PANEL WITH GFR
Alkaline Phosphatase: 47 U/L (ref 39–117)
GFR, Est Non African American: >89 mL/min
Glucose, Bld: 78 mg/dL (ref 70–99)
Sodium: 138 mEq/L (ref 135–145)
Total Bilirubin: 0.7 mg/dL (ref 0.3–1.2)
Total Protein: 7.2 g/dL (ref 6.0–8.3)

## 2011-06-24 ENCOUNTER — Encounter: Payer: 59 | Admitting: Family Medicine

## 2011-06-30 ENCOUNTER — Encounter: Payer: Self-pay | Admitting: Family Medicine

## 2011-09-06 ENCOUNTER — Other Ambulatory Visit: Payer: Self-pay | Admitting: Family Medicine

## 2012-05-10 ENCOUNTER — Encounter: Payer: Self-pay | Admitting: Family Medicine

## 2012-05-10 ENCOUNTER — Ambulatory Visit (INDEPENDENT_AMBULATORY_CARE_PROVIDER_SITE_OTHER): Payer: 59 | Admitting: Family Medicine

## 2012-05-10 VITALS — BP 109/63 | HR 60 | Ht 69.0 in | Wt 123.0 lb

## 2012-05-10 DIAGNOSIS — Z1231 Encounter for screening mammogram for malignant neoplasm of breast: Secondary | ICD-10-CM

## 2012-05-10 DIAGNOSIS — G4700 Insomnia, unspecified: Secondary | ICD-10-CM

## 2012-05-10 DIAGNOSIS — K5090 Crohn's disease, unspecified, without complications: Secondary | ICD-10-CM

## 2012-05-10 DIAGNOSIS — M810 Age-related osteoporosis without current pathological fracture: Secondary | ICD-10-CM

## 2012-05-10 DIAGNOSIS — Z0000 Encounter for general adult medical examination without abnormal findings: Secondary | ICD-10-CM

## 2012-05-10 MED ORDER — NYSTATIN 100000 UNIT/GM EX CREA: TOPICAL_CREAM | Freq: Two times a day (BID) | CUTANEOUS | Status: DC

## 2012-05-10 MED ORDER — ALPRAZOLAM 0.5 MG PO TABS: 0.2500 mg | ORAL_TABLET | Freq: Every evening | ORAL | Status: DC | PRN

## 2012-05-10 NOTE — Addendum Note (Signed)
Addended by: Beatrice Lecher D on: 05/10/2012 07:39 PM   Modules accepted: Orders

## 2012-05-10 NOTE — Patient Instructions (Addendum)
Keep up a regular exercise program and make sure you are eating a healthy diet Try to eat 4 servings of dairy a day, or if you are lactose intolerant take a calcium with vitamin D daily.  Your vaccines are up to date.   

## 2012-05-10 NOTE — Progress Notes (Addendum)
Subjective:     Diane Chavez is a 56 y.o. female and is here for a comprehensive physical exam. The patient reports no problems. Says thinks may have had a panic attack at work. It was very busy adn drank a boost and then suddenly felt very nauseated.  Then got palpitions and chest pounding. No diaphoresis. She says she asked her boss she is in her car with a couple water and calm down from the bed. She says it eventually resolved. Maybe last 10-15 minutes. She's never had a history of panic disorder, thought both of her daughters have a history of panic disorder..  Having problems with insomnia as well and says she is working a lot. Would ike a rx for xanax. Has used it before for sleep.   Osteoporosis - Due for repeat DEXA. She takes her Fosamax regularly and tolerates it well without any reflux-type symptoms.  She also had to give me an update as she has a new GI specialist. [facility]. He evidently did some additional testing and actually ruled out Crohn's which is a diagnosis she has had for several years. He diagnosed her with strict the colitis. The he did keep her on her medication.   History   Social History  . Marital Status: Married    Spouse Name: [name]    Number of Children: 3  . Years of Education: N/A   Occupational History  . [place] History Main Topics  . Smoking status: Former Smoker    Quit date: 06/16/1974  . Smokeless tobacco: Not on file  . Alcohol Use: 4.2 oz/week    7 Glasses of wine per week  . Drug Use: No  . Sexually Active:    Other Topics Concern  . Not on file   Social History Narrative   Lives with husband.Regular exercise-no   Health Maintenance  Topic Date Due  . Mammogram  05/09/2011  . Influenza Vaccine  02/14/2013  . Pap Smear  06/11/2014  . Colonoscopy  07/12/2019  . Tetanus/tdap  11/01/2019    The following portions of the patient's  history were reviewed and updated as appropriate: allergies, current medications, past family history, past medical history, past social history, past surgical history and problem list.  Review of Systems A comprehensive review of systems was negative.   Objective:    BP 109/63  Pulse 60  Ht 5' 9"  (1.753 m)  Wt 123 lb (55.792 kg)  BMI 18.16 kg/m2  LMP 06/11/2000 General appearance: alert, cooperative and appears stated age Head: Normocephalic, without obvious abnormality, atraumatic Eyes: conj clear, EOMI, PEERLA Ears: normal TM's and external ear canals both ears Nose: Nares normal. Septum midline. Mucosa normal. No drainage or sinus tenderness. Throat: lips, mucosa, and tongue normal; teeth and gums normal Neck: no adenopathy, no carotid bruit, no JVD, supple, symmetrical, trachea midline and thyroid not enlarged, symmetric, no tenderness/mass/nodules Back: symmetric, no curvature. ROM normal. No CVA tenderness. Lungs: clear to auscultation bilaterally Breasts: normal appearance, no masses or tenderness Heart: regular rate and rhythm, S1, S2 normal, no murmur, click, rub or gallop Abdomen: soft, non-tender; bowel sounds normal; no masses,  no organomegaly, able to palpate the aorta and feel a pulse. She is very thin.  Extremities: extremities normal, atraumatic, no cyanosis or edema Pulses: 2+ and symmetric Skin: Skin color, texture, turgor normal. No rashes or lesions.  Dry  scaling erythematous rash along the brow ridges. Dry patches on palms of both palms.   Lymph nodes: Cervical, supraclavicular, and axillary nodes normal. Neurologic: Alert and oriented X 3, normal strength and tone. Normal symmetric reflexes. Normal coordination and gait    Assessment:    Healthy female exam.      Plan:     See After Visit Summary for Counseling Recommendations  Due for mammogram and dexa.  Hx of  Osteoporosis. Due for screening CMP and lipid panel. Keep up a regular exercise program  and make sure you are eating a healthy diet Try to eat 4 servings of dairy a day, or if you are lactose intolerant take a calcium with vitamin D daily.  Your vaccines are up to date.   Insomnia - discussed the Xanax is not typically first line for insomnia but says she only uses it occasionally and typically her insomnia is from anxiousness and worrying I will go ahead and fill the prescription. She says she's taken this before without any difficulty. 30 tabs and I said "that she should use this sparingly.  I discussed with her that I'm not sure that her episode of palpitations was necessarily a panic attack. It was precipitated by drinking a boost which can cause nausea and then she had chest pounding and palpitations. This may have been part of nausea as part of an adrenal response.she is low risk for heart disease.   Osteoporosis-due to repeat DEXA. Continue Fosamax.  Dry skin that is red adn flaking along the eyebrow ridge.Most likey seborrheic keratosis. Will tx with topical anti-fungal.

## 2012-05-25 ENCOUNTER — Ambulatory Visit (INDEPENDENT_AMBULATORY_CARE_PROVIDER_SITE_OTHER): Payer: 59

## 2012-05-25 DIAGNOSIS — M899 Disorder of bone, unspecified: Secondary | ICD-10-CM

## 2012-05-25 DIAGNOSIS — M810 Age-related osteoporosis without current pathological fracture: Secondary | ICD-10-CM

## 2012-05-25 DIAGNOSIS — Z1231 Encounter for screening mammogram for malignant neoplasm of breast: Secondary | ICD-10-CM

## 2012-05-26 ENCOUNTER — Encounter: Payer: Self-pay | Admitting: Physician Assistant

## 2012-08-10 ENCOUNTER — Other Ambulatory Visit: Payer: Self-pay | Admitting: Family Medicine

## 2012-08-21 ENCOUNTER — Other Ambulatory Visit: Payer: Self-pay | Admitting: Family Medicine

## 2012-11-24 ENCOUNTER — Other Ambulatory Visit: Payer: Self-pay | Admitting: Family Medicine

## 2012-12-10 DIAGNOSIS — H35379 Puckering of macula, unspecified eye: Secondary | ICD-10-CM | POA: Insufficient documentation

## 2013-03-11 ENCOUNTER — Other Ambulatory Visit: Payer: Self-pay | Admitting: Family Medicine

## 2013-08-22 ENCOUNTER — Other Ambulatory Visit: Payer: Self-pay | Admitting: Family Medicine

## 2013-08-23 ENCOUNTER — Other Ambulatory Visit: Payer: Self-pay | Admitting: *Deleted

## 2013-08-23 MED ORDER — ALPRAZOLAM 0.5 MG PO TABS: ORAL_TABLET | ORAL | Status: DC

## 2013-08-23 NOTE — Telephone Encounter (Signed)
rx sent pt told that if appt not kept she will not get future refills.Diane Chavez

## 2013-09-05 ENCOUNTER — Telehealth: Payer: Self-pay | Admitting: *Deleted

## 2013-09-05 ENCOUNTER — Encounter: Payer: Self-pay | Admitting: Family Medicine

## 2013-09-05 ENCOUNTER — Ambulatory Visit (INDEPENDENT_AMBULATORY_CARE_PROVIDER_SITE_OTHER): Payer: 59 | Admitting: Family Medicine

## 2013-09-05 VITALS — BP 102/59 | HR 58 | Temp 99.1°F | Resp 16 | Ht 69.0 in | Wt 124.0 lb

## 2013-09-05 DIAGNOSIS — R002 Palpitations: Secondary | ICD-10-CM

## 2013-09-05 DIAGNOSIS — Z9889 Other specified postprocedural states: Secondary | ICD-10-CM | POA: Insufficient documentation

## 2013-09-05 DIAGNOSIS — Z0000 Encounter for general adult medical examination without abnormal findings: Secondary | ICD-10-CM

## 2013-09-05 DIAGNOSIS — Z1231 Encounter for screening mammogram for malignant neoplasm of breast: Secondary | ICD-10-CM

## 2013-09-05 DIAGNOSIS — R21 Rash and other nonspecific skin eruption: Secondary | ICD-10-CM

## 2013-09-05 MED ORDER — ALENDRONATE SODIUM 70 MG PO TABS: ORAL_TABLET | ORAL | Status: DC

## 2013-09-05 NOTE — Patient Instructions (Addendum)
Please reconsider starting your bone medication.  You are very young to have thin bones and I feel like we need to do what we can to maintain you bone strength Call in November for repeat bone density   Keep up a regular exercise program and make sure you are eating a healthy diet Try to eat 4 servings of dairy a day, or if you are lactose intolerant take a calcium with vitamin D daily.  Your vaccines are up to date.

## 2013-09-05 NOTE — Progress Notes (Signed)
Subjective:     Diane Chavez is a 58 y.o. female and is here for a comprehensive physical exam. The patient reports no problems.  Rash on left shin for > 1 mo.  + itchey periodically. Has tried several OTC moisturizers and vitamin E etc. No OTC steroid, antibacterials or antifungals. No worsening or alleviating factors. She felt like it may have started after she had shaved a little too hard. She thought was a shave burn. But it hasn't healed.   History   Social History  . Marital Status: Married    Spouse Name: [name]    Number of Children: 3  . Years of Education: N/A   Occupational History  . [place] History Main Topics  . Smoking status: Former Smoker    Quit date: 06/16/1974  . Smokeless tobacco: Not on file  . Alcohol Use: 4.2 oz/week    7 Glasses of wine per week  . Drug Use: No  . Sexual Activity:    Other Topics Concern  . Not on file   Social History Narrative   Lives with husband.Regular exercise-no.   Health Maintenance  Topic Date Due  . Mammogram  05/25/2013  . Influenza Vaccine  01/14/2014  . Pap Smear  06/11/2014  . Colonoscopy  07/12/2019  . Tetanus/tdap  11/01/2019    The following portions of the patient's history were reviewed and updated as appropriate: allergies, current medications, past family history, past medical history, past social history, past surgical history and problem list.  Review of Systems A comprehensive review of systems was negative.  has been under a little moe stress lately and has had a few "flutters" but feels like more in her stomach   Objective:    BP 102/59  Pulse 58  Temp(Src) 99.1 F (37.3 C) (Oral)  Resp 16  Ht 5' 9"  (1.753 m)  Wt 124 lb (56.246 kg)  BMI 18.30 kg/m2  SpO2 94%  LMP 06/11/2000 General appearance: alert, cooperative and appears stated age Head: Normocephalic, without obvious abnormality, atraumatic Eyes: conj clear, EOMI, PEERLA Ears: normal TM's and  external ear canals both ears Nose: Nares normal. Septum midline. Mucosa normal. No drainage or sinus tenderness. Throat: lips, mucosa, and tongue normal; teeth and gums normal Neck: no adenopathy, no carotid bruit, no JVD, supple, symmetrical, trachea midline and thyroid not enlarged, symmetric, no tenderness/mass/nodules Back: symmetric, no curvature. ROM normal. No CVA tenderness. Lungs: clear to auscultation bilaterally Breasts: normal appearance, no masses or tenderness Heart: regular rate and rhythm, S1, S2 normal, no murmur, click, rub or gallop Abdomen: soft, non-tender; bowel sounds normal; no masses,  no organomegaly Extremities: extremities normal, atraumatic, no cyanosis or edema Pulses: 2+ and symmetric Skin: 2.5 x 10 cm erythematous scaling patch, well demarcated.   Lymph nodes: Cervical, supraclavicular, and axillary nodes normal. Neurologic: Alert and oriented X 3, normal strength and tone. Normal symmetric reflexes. Normal coordination and gait    Assessment:    Healthy female exam.      Plan:    Rash on the left shin.- sk in scraping performed. Will call with results. Likely would respond to topical steroid.    See After Visit Summary for Counseling Recommendations  Keep up a regular exercise program and make sure you are eating a healthy diet Try to eat 4 servings of dairy a day, or if you are lactose intolerant take a calcium with vitamin D daily.  Your vaccines are  up to date.   Osteopenia - was on Fosamax for 2 years but decided to stop it. Evidently she saw some warnings on TV and decided it was too risky.  Prior history of cardiac surgery-EKG today shows rate of 56 beats per minute, normal sinus rhythm, there is a single PVC detected. No acute changes. There is a flipped T wave in lead V2 which was not apparent in 05/2011.

## 2013-09-05 NOTE — Telephone Encounter (Signed)
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 The surgery was done at  [facility] 10[facility]24[facility]. The surgeon was DR. Vickey Sages, and the pediatrician was Dr. Donnie Aho. Fax sent to [phone] for records release.Diane Chavez [place]

## 2013-09-05 NOTE — Progress Notes (Deleted)
   Subjective:    Patient ID: Diane Chavez, female    DOB: Nov 26, 1955, 58 y.o.   MRN: [id number]  HPI    Review of Systems     Objective:   Physical Exam        Assessment & Plan:

## 2013-09-06 ENCOUNTER — Other Ambulatory Visit: Payer: Self-pay | Admitting: Family Medicine

## 2013-09-06 LAB — KOH PREP: RESULT - KOH: NONE SEEN

## 2013-09-06 MED ORDER — TRIAMCINOLONE ACETONIDE 0.5 % EX OINT: 1.0000 "application " | TOPICAL_OINTMENT | Freq: Every day | CUTANEOUS | Status: DC

## 2013-09-08 ENCOUNTER — Encounter: Payer: Self-pay | Admitting: *Deleted

## 2013-10-12 ENCOUNTER — Other Ambulatory Visit: Payer: Self-pay | Admitting: Family Medicine

## 2013-12-26 ENCOUNTER — Other Ambulatory Visit: Payer: Self-pay | Admitting: Family Medicine

## 2014-02-22 ENCOUNTER — Other Ambulatory Visit: Payer: Self-pay | Admitting: Family Medicine

## 2014-04-25 ENCOUNTER — Other Ambulatory Visit: Payer: Self-pay | Admitting: Family Medicine

## 2014-06-21 ENCOUNTER — Other Ambulatory Visit: Payer: Self-pay | Admitting: Family Medicine

## 2014-08-27 ENCOUNTER — Other Ambulatory Visit: Payer: Self-pay | Admitting: Family Medicine

## 2014-09-01 ENCOUNTER — Encounter: Payer: Self-pay | Admitting: Family Medicine

## 2014-09-11 ENCOUNTER — Encounter: Payer: Self-pay | Admitting: Family Medicine

## 2014-09-11 ENCOUNTER — Other Ambulatory Visit (HOSPITAL_COMMUNITY)
Admission: RE | Admit: 2014-09-11 | Discharge: 2014-09-11 | Disposition: A | Discharge status: Home / Self Care | Payer: BLUE CROSS/BLUE SHIELD | Source: Ambulatory Visit | Attending: Family Medicine | Admitting: Family Medicine

## 2014-09-11 ENCOUNTER — Ambulatory Visit (INDEPENDENT_AMBULATORY_CARE_PROVIDER_SITE_OTHER): Payer: BLUE CROSS/BLUE SHIELD | Admitting: Family Medicine

## 2014-09-11 VITALS — BP 102/66 | HR 54 | Wt 118.0 lb

## 2014-09-11 DIAGNOSIS — M810 Age-related osteoporosis without current pathological fracture: Secondary | ICD-10-CM

## 2014-09-11 DIAGNOSIS — Z1151 Encounter for screening for human papillomavirus (HPV): Secondary | ICD-10-CM | POA: Insufficient documentation

## 2014-09-11 DIAGNOSIS — Z01419 Encounter for gynecological examination (general) (routine) without abnormal findings: Secondary | ICD-10-CM

## 2014-09-11 DIAGNOSIS — Z1231 Encounter for screening mammogram for malignant neoplasm of breast: Secondary | ICD-10-CM | POA: Diagnosis not present

## 2014-09-11 DIAGNOSIS — Z0189 Encounter for other specified special examinations: Secondary | ICD-10-CM

## 2014-09-11 DIAGNOSIS — Z0000 Encounter for general adult medical examination without abnormal findings: Secondary | ICD-10-CM

## 2014-09-11 LAB — LIPID PANEL
Cholesterol: 207 mg/dL — ABNORMAL HIGH (ref 0–200)
HDL: 75 mg/dL (ref >=46)
LDL CALC: 121 mg/dL — AB (ref 0–99)
TRIGLYCERIDES: 54 mg/dL (ref <150)
Total CHOL/HDL Ratio: 2.8 Ratio
VLDL: 11 mg/dL (ref 0–40)

## 2014-09-11 LAB — COMPLETE METABOLIC PANEL WITH GFR
ALT: 13 U/L (ref 0–35)
AST: 20 U/L (ref 0–37)
Albumin: 4.7 g/dL (ref 3.5–5.2)
Alkaline Phosphatase: 45 U/L (ref 39–117)
BILIRUBIN TOTAL: 0.7 mg/dL (ref 0.2–1.2)
BUN: 12 mg/dL (ref 6–23)
CO2: 29 meq/L (ref 19–32)
Calcium: 9.5 mg/dL (ref 8.4–10.5)
Chloride: 103 mEq/L (ref 96–112)
Creat: 0.78 mg/dL (ref 0.50–1.10)
GFR, EST NON AFRICAN AMERICAN: 84 mL/min
GLUCOSE: 75 mg/dL (ref 70–99)
Potassium: 4.1 mEq/L (ref 3.5–5.3)
Sodium: 138 mEq/L (ref 135–145)
TOTAL PROTEIN: 7.2 g/dL (ref 6.0–8.3)

## 2014-09-11 MED ORDER — ALPRAZOLAM 0.5 MG PO TABS: ORAL_TABLET | ORAL | Status: DC

## 2014-09-11 MED ORDER — ALENDRONATE SODIUM 70 MG PO TABS: ORAL_TABLET | ORAL | Status: DC

## 2014-09-11 NOTE — Patient Instructions (Signed)
Keep up a regular exercise program and make sure you are eating a healthy diet Try to eat 4 servings of dairy a day, or if you are lactose intolerant take a calcium with vitamin D daily.  Your vaccines are up to date.   

## 2014-09-11 NOTE — Progress Notes (Signed)
  Subjective:     Diane Chavez is a 59 y.o. female and is here for a comprehensive physical exam. The patient reports no problems.  History   Social History  . Marital Status: Married    Spouse Name: [name]  . Number of Children: 3  . Years of Education: N/A   Occupational History  . [place] History Main Topics  . Smoking status: Former Smoker    Quit date: 06/16/1974  . Smokeless tobacco: Not on file  . Alcohol Use: 4.2 oz/week    7 Glasses of wine per week  . Drug Use: No  . Sexual Activity: Not on file   Other Topics Concern  . Not on file   Social History Narrative   Lives with husband.Regular exercise-no.   Health Maintenance  Topic Date Due  . Hepatitis C Screening  1956/05/11  . HIV Screening  08/25/1971  . MAMMOGRAM  05/25/2013  . PAP SMEAR  06/11/2014  . INFLUENZA VACCINE  01/15/2015  . COLONOSCOPY  07/12/2019  . TETANUS/TDAP  11/01/2019    The following portions of the patient's history were reviewed and updated as appropriate: allergies, current medications, past family history, past medical history, past social history, past surgical history and problem list.  Review of Systems A comprehensive review of systems was negative.   Objective:    BP 102/66 mmHg  Pulse 54  Wt 118 lb (53.524 kg)  SpO2 100%  LMP 06/11/2000 General appearance: alert, cooperative and appears stated age Head: Normocephalic, without obvious abnormality, atraumatic Eyes: conj clear, EOMi, PEERLA Ears: normal TM's and external ear canals both ears Nose: Nares normal. Septum midline. Mucosa normal. No drainage or sinus tenderness. Throat: lips, mucosa, and tongue normal; teeth and gums normal Neck: no adenopathy, no carotid bruit, no JVD, supple, symmetrical, trachea midline and thyroid not enlarged, symmetric, no tenderness/mass/nodules Back: symmetric, no curvature. ROM normal. No CVA tenderness. Lungs: clear to auscultation  bilaterally Breasts: normal appearance, no masses or tenderness Heart: regular rate and rhythm, S1, S2 normal, no murmur, click, rub or gallop Abdomen: soft, non-tender; bowel sounds normal; no masses,  no organomegaly Pelvic: cervix normal in appearance, external genitalia normal, no adnexal masses or tenderness, no cervical motion tenderness, rectovaginal septum normal, uterus normal size, shape, and consistency and vagina normal without discharge Extremities: extremities normal, atraumatic, no cyanosis or edema Pulses: 2+ and symmetric Skin: Skin color, texture, turgor normal. No rashes or lesions Lymph nodes: Cervical, supraclavicular, and axillary nodes normal. Neurologic: Alert and oriented X 3, normal strength and tone. Normal symmetric reflexes. Normal coordination and gait    Assessment:    Healthy female exam.      Plan:     See After Visit Summary for Counseling Recommendations   Keep up a regular exercise program and make sure you are eating a healthy diet Try to eat 4 servings of dairy a day, or if you are lactose intolerant take a calcium with vitamin D daily.  Your vaccines are up to date.  Pap performed. Will call with results Mammogram and bone density ordered. Medication refill for one year. Next  Insomnia-refill her alprazolam. But did remind her the increased risk of falls and dementia with this particular medication to use it sparingly.

## 2014-09-14 LAB — CYTOLOGY - PAP

## 2014-09-20 NOTE — Telephone Encounter (Signed)
Refills were sent to pharmacy

## 2014-09-27 ENCOUNTER — Ambulatory Visit (INDEPENDENT_AMBULATORY_CARE_PROVIDER_SITE_OTHER): Payer: BLUE CROSS/BLUE SHIELD

## 2014-09-27 DIAGNOSIS — R928 Other abnormal and inconclusive findings on diagnostic imaging of breast: Secondary | ICD-10-CM

## 2014-09-27 DIAGNOSIS — Z01419 Encounter for gynecological examination (general) (routine) without abnormal findings: Secondary | ICD-10-CM

## 2014-09-27 DIAGNOSIS — M810 Age-related osteoporosis without current pathological fracture: Secondary | ICD-10-CM

## 2014-09-27 DIAGNOSIS — M8580 Other specified disorders of bone density and structure, unspecified site: Secondary | ICD-10-CM | POA: Diagnosis not present

## 2014-09-27 DIAGNOSIS — Z1231 Encounter for screening mammogram for malignant neoplasm of breast: Secondary | ICD-10-CM

## 2014-09-27 DIAGNOSIS — Z0000 Encounter for general adult medical examination without abnormal findings: Secondary | ICD-10-CM

## 2014-09-29 ENCOUNTER — Other Ambulatory Visit: Payer: Self-pay | Admitting: Family Medicine

## 2014-09-29 DIAGNOSIS — R928 Other abnormal and inconclusive findings on diagnostic imaging of breast: Secondary | ICD-10-CM

## 2014-10-11 ENCOUNTER — Ambulatory Visit
Admission: RE | Admit: 2014-10-11 | Discharge: 2014-10-11 | Disposition: A | Discharge status: Home / Self Care | Payer: BLUE CROSS/BLUE SHIELD | Source: Ambulatory Visit | Attending: Family Medicine | Admitting: Family Medicine

## 2014-10-11 DIAGNOSIS — R928 Other abnormal and inconclusive findings on diagnostic imaging of breast: Secondary | ICD-10-CM

## 2015-01-16 ENCOUNTER — Other Ambulatory Visit: Payer: Self-pay | Admitting: Family Medicine

## 2015-07-18 ENCOUNTER — Telehealth: Payer: Self-pay

## 2015-07-18 ENCOUNTER — Other Ambulatory Visit: Payer: Self-pay | Admitting: Family Medicine

## 2015-07-18 MED ORDER — ALPRAZOLAM 0.5 MG PO TABS: ORAL_TABLET | ORAL | Status: DC

## 2015-07-18 NOTE — Telephone Encounter (Signed)
Patient request refill for Xanax. #[phone] refills were place in PCP box for signature and will be faxed by her nurse. Patient was transferred to scheduling to get appointment scheduled. Diane Chavez,CMA

## 2015-07-31 ENCOUNTER — Ambulatory Visit (INDEPENDENT_AMBULATORY_CARE_PROVIDER_SITE_OTHER): Payer: BLUE CROSS/BLUE SHIELD | Admitting: Family Medicine

## 2015-07-31 ENCOUNTER — Encounter: Payer: Self-pay | Admitting: Family Medicine

## 2015-07-31 VITALS — BP 113/56 | HR 59 | Ht 69.0 in | Wt 120.7 lb

## 2015-07-31 DIAGNOSIS — Z114 Encounter for screening for human immunodeficiency virus [HIV]: Secondary | ICD-10-CM

## 2015-07-31 DIAGNOSIS — Z1159 Encounter for screening for other viral diseases: Secondary | ICD-10-CM

## 2015-07-31 DIAGNOSIS — Z0000 Encounter for general adult medical examination without abnormal findings: Secondary | ICD-10-CM | POA: Diagnosis not present

## 2015-07-31 DIAGNOSIS — Z1231 Encounter for screening mammogram for malignant neoplasm of breast: Secondary | ICD-10-CM

## 2015-07-31 MED ORDER — ALPRAZOLAM 0.5 MG PO TABS: ORAL_TABLET | ORAL | Status: DC

## 2015-07-31 NOTE — Progress Notes (Signed)
  Subjective:     Diane Chavez is a 60 y.o. female and is here for a comprehensive physical exam. The patient reports no problems.  Social History   Social History  . Marital Status: Married    Spouse Name: [name]  . Number of Children: 3  . Years of Education: N/A   Occupational History  . [place] History Main Topics  . Smoking status: Former Smoker    Quit date: 06/16/1974  . Smokeless tobacco: Not on file  . Alcohol Use: 4.2 oz/week    7 Glasses of wine per week  . Drug Use: No  . Sexual Activity: Not on file   Other Topics Concern  . Not on file   Social History Narrative   Lives with husband.Regular exercise-no.   Health Maintenance  Topic Date Due  . Hepatitis C Screening  23-Mar-1956  . HIV Screening  08/25/1971  . MAMMOGRAM  09/27/2015  . INFLUENZA VACCINE  01/15/2016  . PAP SMEAR  09/10/2017  . COLONOSCOPY  07/12/2019  . TETANUS/TDAP  11/01/2019    The following portions of the patient's history were reviewed and updated as appropriate: allergies, current medications, past family history, past medical history, past social history, past surgical history and problem list.  Review of Systems A comprehensive review of systems was negative.   Objective:    BP 113/56 mmHg  Pulse 59  Ht 5' 9"  (1.753 m)  Wt 120 lb 11.2 oz (54.749 kg)  BMI 17.82 kg/m2  SpO2 99%  LMP 06/11/2000 General appearance: alert, cooperative and appears stated age Head: Normocephalic, without obvious abnormality, atraumatic Eyes: conj clear. EOMI, PEERLA Ears: normal TM's and external ear canals both ears Nose: Nares normal. Septum midline. Mucosa normal. No drainage or sinus tenderness. Throat: lips, mucosa, and tongue normal; teeth and gums normal Neck: no adenopathy, no carotid bruit, no JVD, supple, symmetrical, trachea midline and thyroid not enlarged, symmetric, no tenderness/mass/nodules Back: symmetric, no curvature. ROM normal. No  CVA tenderness. Lungs: clear to auscultation bilaterally Breasts: normal appearance, no masses or tenderness Heart: regular rate and rhythm, S1, S2 normal, no murmur, click, rub or gallop Abdomen: soft, non-tender; bowel sounds normal; no masses,  no organomegaly Extremities: extremities normal, atraumatic, no cyanosis or edema Pulses: 2+ and symmetric Skin: Skin color, texture, turgor normal. No rashes or lesions Lymph nodes: Cervical, supraclavicular, and axillary nodes normal. Neurologic: Alert and oriented X 3, normal strength and tone. Normal symmetric reflexes. Normal coordination and gait    Assessment:    Healthy female exam.      Plan:     See After Visit Summary for Counseling Recommendations  Keep up a regular exercise program and make sure you are eating a healthy diet Try to eat 4 servings of dairy a day, or if you are lactose intolerant take a calcium with vitamin D daily.  Your vaccines are up to date.    Anxiety - she has stopped using her xanax daily for sleep nd has gone back to using it PRN for anxiwty.

## 2015-08-23 LAB — LIPID PANEL
Cholesterol: 206 mg/dL — ABNORMAL HIGH (ref 125–200)
HDL: 77 mg/dL (ref >=46)
LDL Cholesterol: 110 mg/dL (ref <130)
Total CHOL/HDL Ratio: 2.7 Ratio (ref <=5.0)
Triglycerides: 94 mg/dL (ref <150)
VLDL: 19 mg/dL (ref <30)

## 2015-08-23 LAB — COMPLETE METABOLIC PANEL WITH GFR
ALBUMIN: 4.5 g/dL (ref 3.6–5.1)
ALK PHOS: 54 U/L (ref 33–130)
ALT: 16 U/L (ref 6–29)
AST: 23 U/L (ref 10–35)
BILIRUBIN TOTAL: 0.7 mg/dL (ref 0.2–1.2)
BUN: 12 mg/dL (ref 7–25)
CO2: 30 mmol/L (ref 20–31)
CREATININE: 0.74 mg/dL (ref 0.50–1.05)
Calcium: 9.6 mg/dL (ref 8.6–10.4)
Chloride: 101 mmol/L (ref 98–110)
GFR, Est African American: >89 mL/min (ref >=60)
GLUCOSE: 79 mg/dL (ref 65–99)
Potassium: 4.4 mmol/L (ref 3.5–5.3)
SODIUM: 137 mmol/L (ref 135–146)
TOTAL PROTEIN: 7.1 g/dL (ref 6.1–8.1)

## 2015-08-23 LAB — HEPATITIS C ANTIBODY: HCV Ab: NEGATIVE

## 2015-08-23 LAB — HIV ANTIBODY (ROUTINE TESTING W REFLEX): HIV 1&2 Ab, 4th Generation: NONREACTIVE

## 2015-11-24 IMAGING — DX DG DXA BONE DENSITY STUDY HL7
4 series · 4 of 4 positions shown · non-contrast
Comparison: The bone mineral density in the lumbar spine is
increased by 7.2% since the baseline and previous exam,
statistically significant.

CLINICAL DATA: Postmenopausal. Screening

EXAM:
DUAL X-RAY ABSORPTIOMETRY (DXA) FOR BONE MINERAL DENSITY

[view not recorded (1 of 4)]
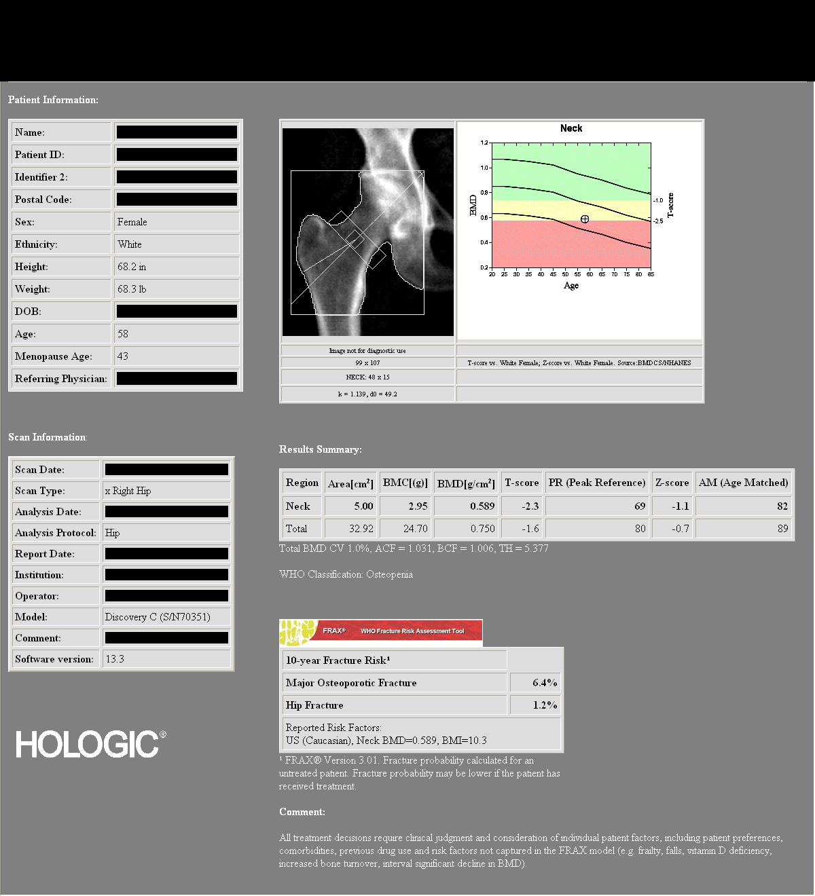

[view not recorded (2 of 4)]
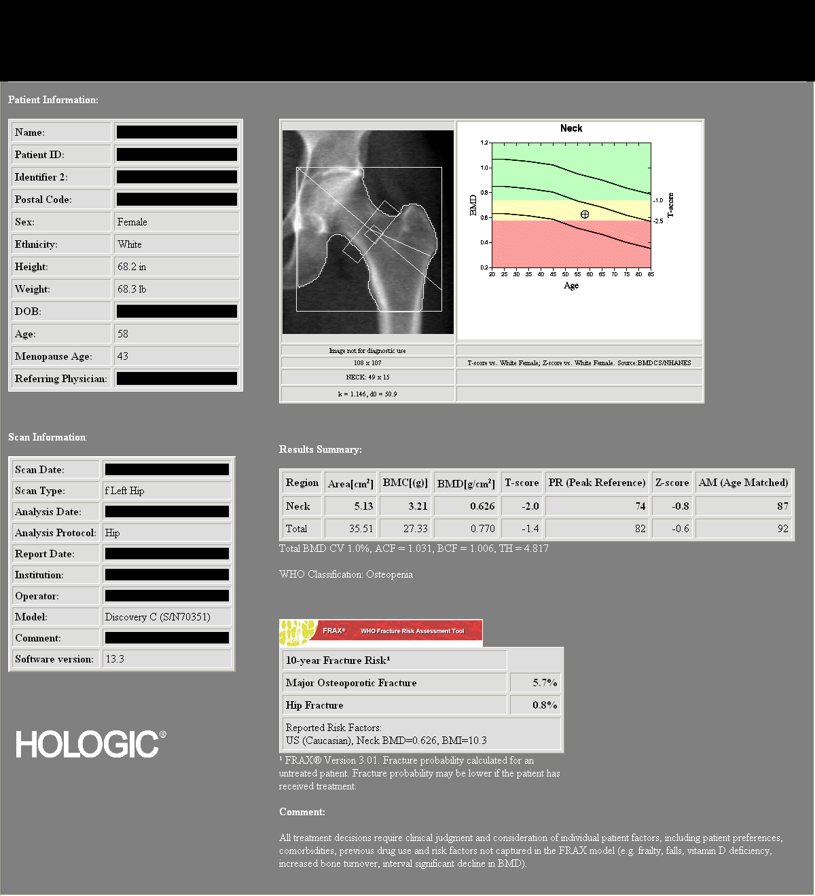

[view not recorded (3 of 4)]
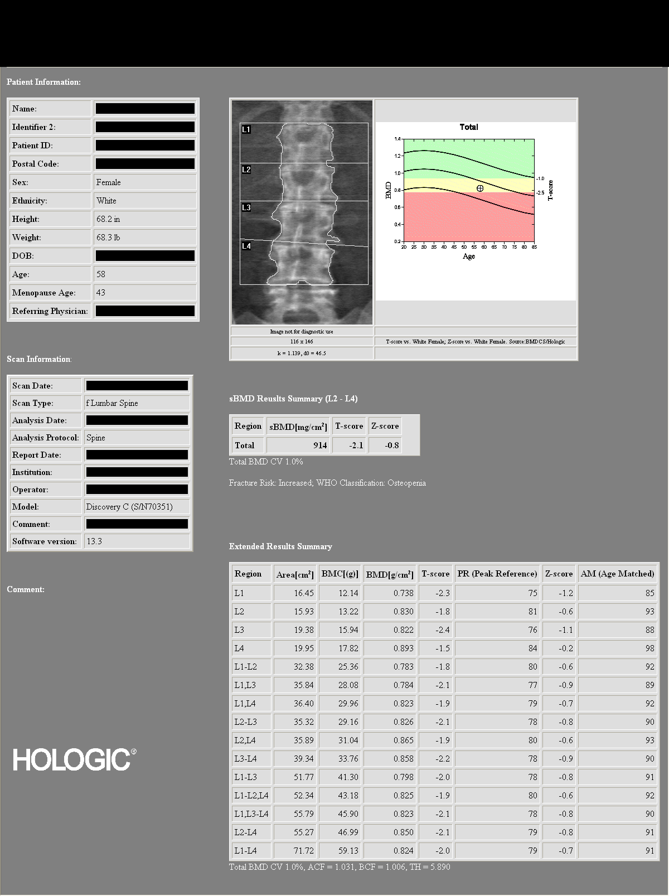

[view not recorded (4 of 4)]
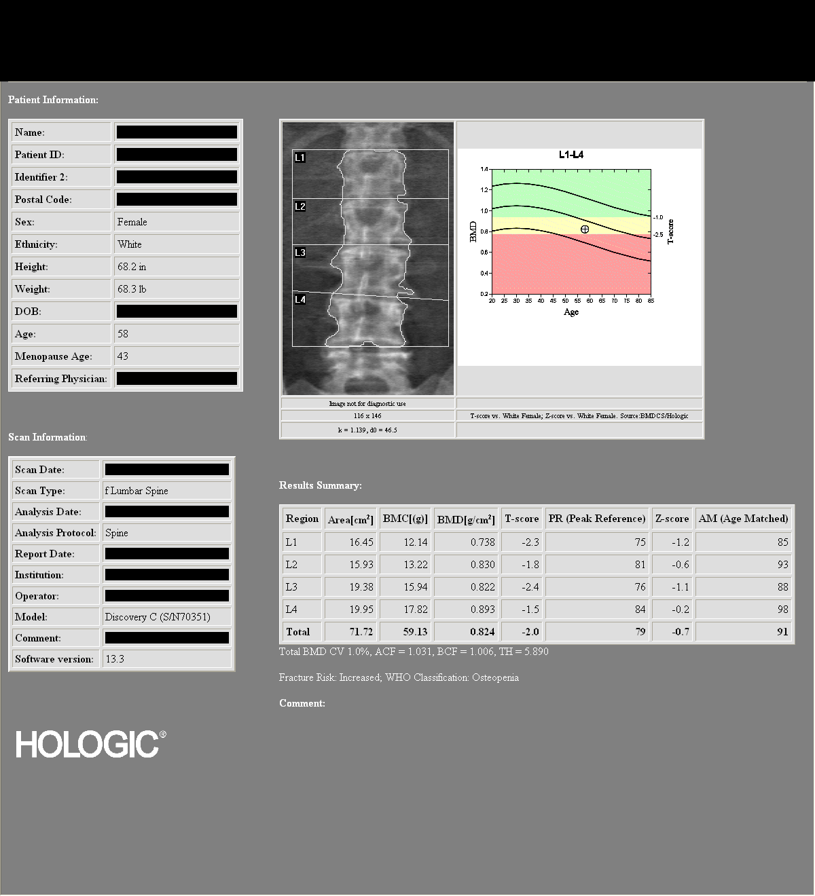

[4 of 4 positions shown; findings below may reference images not displayed]

FINDINGS: AP LUMBAR SPINE

Bone Mineral Density (BMD):  0.81 for g/cm2

Young Adult T-Score:  -2.1

Z-Score:  -1

Left FEMUR neck

Bone Mineral Density (BMD):  0.626 g/cm2

Young Adult T-Score: -2

Z-Score:  -0.8

ASSESSMENT: Patient's diagnostic category is osteopenia by WHO
Criteria.

FRACTURE RISK: Increased

FRAX: Based on the World Health Organization FRAX model, the 10 year
probability of a major osteoporotic fracture is 5.7%. The 10 year
probability of a hip fracture is 0.8%.
The bone mineral density in the left hip
is decreased by 3.9% since the baseline, statistically significant.
The bone mineral density in the left hip is decreased by 0.2% since
the previous exam, not statistically significant.

Effective therapies are available in the form of bisphosphonates,
selective estrogen receptor modulators, biologic agents, and hormone
replacement therapy (for women). All patients should ensure an
adequate intake of dietary calcium (9655 mg daily) and vitamin D
([address]) unless contraindicated.

All treatment decisions require clinical judgment and consideration
of individual patient factors, including patient preferences,
co-morbidities, previous drug use, risk factors not captured in the
FRAX model (e.g., frailty, falls, vitamin D deficiency, increased
bone turnover, interval significant decline in bone density) and
possible under- or over-estimation of fracture risk by FRAX.

The National Osteoporosis Foundation recommends that FDA-approved
medical therapies be considered in postmenopausal women and men age
50 or older with a:

1. Hip or vertebral (clinical or morphometric) fracture.

2. T-score of -2.5 or lower at the spine or hip.

3. Ten-year fracture probability by FRAX of 3% or greater for hip
fracture or 20% or greater for major osteoporotic fracture.

People with diagnosed cases of osteoporosis or at high risk for
fracture should have regular bone mineral density tests. For
patients eligible for Medicare, routine testing is allowed once
every 2 years. The testing frequency can be increased to one year
for patients who have rapidly progressing disease, those who are
receiving or discontinuing medical therapy to restore bone mass, or
have additional risk factors.

World Health Organization (WHO) Criteria:

Normal: T-scores from +1.0 to -1.0

Low Bone Mass (Osteopenia): T-scores between -1.0 and -2.5

Osteoporosis: T-scores -2.5 and below

Comparison to Reference Population:

T-score is the key measure used in the diagnosis of osteoporosis and
relative risk determination for fracture. It provides a value for
bone mass relative to the mean bone mass of a young adult reference
population expressed in terms of standard deviation (SD).

Z-score is the age-matched score showing the patient's values
compared to a population matched for age, sex, and race. This is
also expressed in terms of standard deviation. The patient may have
values that compare favorably to the age-matched values and still be
at increased risk for fracture.

## 2016-08-04 DIAGNOSIS — M9902 Segmental and somatic dysfunction of thoracic region: Secondary | ICD-10-CM | POA: Diagnosis not present

## 2016-08-04 DIAGNOSIS — M9904 Segmental and somatic dysfunction of sacral region: Secondary | ICD-10-CM | POA: Diagnosis not present

## 2016-08-04 DIAGNOSIS — M9901 Segmental and somatic dysfunction of cervical region: Secondary | ICD-10-CM | POA: Diagnosis not present

## 2016-08-04 DIAGNOSIS — M9903 Segmental and somatic dysfunction of lumbar region: Secondary | ICD-10-CM | POA: Diagnosis not present

## 2016-08-05 DIAGNOSIS — M9902 Segmental and somatic dysfunction of thoracic region: Secondary | ICD-10-CM | POA: Diagnosis not present

## 2016-08-05 DIAGNOSIS — M9904 Segmental and somatic dysfunction of sacral region: Secondary | ICD-10-CM | POA: Diagnosis not present

## 2016-08-05 DIAGNOSIS — M9901 Segmental and somatic dysfunction of cervical region: Secondary | ICD-10-CM | POA: Diagnosis not present

## 2016-08-05 DIAGNOSIS — M9903 Segmental and somatic dysfunction of lumbar region: Secondary | ICD-10-CM | POA: Diagnosis not present

## 2016-08-06 DIAGNOSIS — M9904 Segmental and somatic dysfunction of sacral region: Secondary | ICD-10-CM | POA: Diagnosis not present

## 2016-08-06 DIAGNOSIS — M9903 Segmental and somatic dysfunction of lumbar region: Secondary | ICD-10-CM | POA: Diagnosis not present

## 2016-08-06 DIAGNOSIS — M9902 Segmental and somatic dysfunction of thoracic region: Secondary | ICD-10-CM | POA: Diagnosis not present

## 2016-08-06 DIAGNOSIS — M9901 Segmental and somatic dysfunction of cervical region: Secondary | ICD-10-CM | POA: Diagnosis not present

## 2016-08-07 DIAGNOSIS — M9904 Segmental and somatic dysfunction of sacral region: Secondary | ICD-10-CM | POA: Diagnosis not present

## 2016-08-07 DIAGNOSIS — M9902 Segmental and somatic dysfunction of thoracic region: Secondary | ICD-10-CM | POA: Diagnosis not present

## 2016-08-07 DIAGNOSIS — M9903 Segmental and somatic dysfunction of lumbar region: Secondary | ICD-10-CM | POA: Diagnosis not present

## 2016-08-07 DIAGNOSIS — M9901 Segmental and somatic dysfunction of cervical region: Secondary | ICD-10-CM | POA: Diagnosis not present

## 2016-08-11 DIAGNOSIS — M9901 Segmental and somatic dysfunction of cervical region: Secondary | ICD-10-CM | POA: Diagnosis not present

## 2016-08-11 DIAGNOSIS — M9902 Segmental and somatic dysfunction of thoracic region: Secondary | ICD-10-CM | POA: Diagnosis not present

## 2016-08-11 DIAGNOSIS — M9904 Segmental and somatic dysfunction of sacral region: Secondary | ICD-10-CM | POA: Diagnosis not present

## 2016-08-11 DIAGNOSIS — M9903 Segmental and somatic dysfunction of lumbar region: Secondary | ICD-10-CM | POA: Diagnosis not present

## 2016-08-12 DIAGNOSIS — M9903 Segmental and somatic dysfunction of lumbar region: Secondary | ICD-10-CM | POA: Diagnosis not present

## 2016-08-12 DIAGNOSIS — M9902 Segmental and somatic dysfunction of thoracic region: Secondary | ICD-10-CM | POA: Diagnosis not present

## 2016-08-12 DIAGNOSIS — M9901 Segmental and somatic dysfunction of cervical region: Secondary | ICD-10-CM | POA: Diagnosis not present

## 2016-08-12 DIAGNOSIS — M9904 Segmental and somatic dysfunction of sacral region: Secondary | ICD-10-CM | POA: Diagnosis not present

## 2016-08-13 DIAGNOSIS — M9904 Segmental and somatic dysfunction of sacral region: Secondary | ICD-10-CM | POA: Diagnosis not present

## 2016-08-13 DIAGNOSIS — M9902 Segmental and somatic dysfunction of thoracic region: Secondary | ICD-10-CM | POA: Diagnosis not present

## 2016-08-13 DIAGNOSIS — M9901 Segmental and somatic dysfunction of cervical region: Secondary | ICD-10-CM | POA: Diagnosis not present

## 2016-08-13 DIAGNOSIS — M9903 Segmental and somatic dysfunction of lumbar region: Secondary | ICD-10-CM | POA: Diagnosis not present

## 2016-08-14 DIAGNOSIS — M9901 Segmental and somatic dysfunction of cervical region: Secondary | ICD-10-CM | POA: Diagnosis not present

## 2016-08-14 DIAGNOSIS — M9904 Segmental and somatic dysfunction of sacral region: Secondary | ICD-10-CM | POA: Diagnosis not present

## 2016-08-14 DIAGNOSIS — M9903 Segmental and somatic dysfunction of lumbar region: Secondary | ICD-10-CM | POA: Diagnosis not present

## 2016-08-14 DIAGNOSIS — M9902 Segmental and somatic dysfunction of thoracic region: Secondary | ICD-10-CM | POA: Diagnosis not present

## 2016-08-18 ENCOUNTER — Encounter: Payer: Self-pay | Admitting: Family Medicine

## 2016-08-18 ENCOUNTER — Ambulatory Visit (INDEPENDENT_AMBULATORY_CARE_PROVIDER_SITE_OTHER): Payer: BLUE CROSS/BLUE SHIELD | Admitting: Family Medicine

## 2016-08-18 VITALS — BP 130/56 | HR 57 | Wt 125.0 lb

## 2016-08-18 DIAGNOSIS — Z0000 Encounter for general adult medical examination without abnormal findings: Secondary | ICD-10-CM

## 2016-08-18 DIAGNOSIS — Z1231 Encounter for screening mammogram for malignant neoplasm of breast: Secondary | ICD-10-CM | POA: Diagnosis not present

## 2016-08-18 NOTE — Progress Notes (Signed)
Subjective:     Diane Chavez is a 61 y.o. female and is here for a comprehensive physical exam. The patient reports no problems.  She recently has started walking several days a week for exercise.  Social History   Social History  . Marital status: Married    Spouse name: [name]  . Number of children: 3  . 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 Smoking status: Former Smoker    Quit date: 06/16/1974  . Smokeless tobacco: Never Used  . Alcohol use 4.2 oz/week    7 Glasses of wine per week  . Drug use: No  . Sexual activity: Not on file   Other Topics Concern  . Not on file   Social History Narrative   Lives with husband.Regular exercise-no.   Health Maintenance  Topic Date Due  . MAMMOGRAM  09/27/2015  . PAP SMEAR  09/10/2017  . COLONOSCOPY  07/12/2019  . TETANUS/TDAP  11/01/2019  . INFLUENZA VACCINE  Addressed  . Hepatitis C Screening  Completed  . HIV Screening  Completed    The following portions of the patient's history were reviewed and updated as appropriate: allergies, current medications, past family history, past medical history, past social history, past surgical history and problem list.  Review of Systems A comprehensive review of systems was negative.   Objective:    BP (!) 130/56   Pulse (!) 57   Wt 125 lb (56.7 kg)   LMP 06/11/2000   BMI 18.46 kg/m  General appearance: alert, cooperative and appears stated age Head: Normocephalic, without obvious abnormality, atraumatic Eyes: conj clear, EOMI, PEERLA Ears: normal TM's and external ear canals both ears Nose: Nares normal. Septum midline. Mucosa normal. No drainage or sinus tenderness. Throat: lips, mucosa, and tongue normal; teeth and gums normal Neck: no adenopathy, no carotid bruit, no JVD, supple, symmetrical, trachea midline and thyroid not enlarged, symmetric, no tenderness/mass/nodules Back: symmetric, no curvature.  ROM normal. No CVA tenderness. Lungs: clear to auscultation bilaterally Heart: regular rate and rhythm, S1, S2 normal, no murmur, click, rub or gallop Abdomen: soft, non-tender; bowel sounds normal; no masses,  no organomegaly Extremities: extremities normal, atraumatic, no cyanosis or edema Pulses: 2+ and symmetric Skin: Skin color, texture, turgor normal. No rashes or lesions Lymph nodes: Cervical, supraclavicular, and axillary nodes normal. Neurologic: Alert and oriented X 3, normal strength and tone. Normal symmetric reflexes. Normal coordination and gait    Assessment:    Healthy female exam.      Plan:     See After Visit Summary for Counseling Recommendations   Keep up a regular exercise program and make sure you are eating a healthy diet Try to eat 4 servings of dairy a day, or if you are lactose intolerant take a calcium with vitamin D daily.  Your vaccines are up to date.  Please schedule mammogram. Order placed. Discussed need for shingles vaccine. She never had chickenpox as a child but says she'll consider it.

## 2016-08-18 NOTE — Patient Instructions (Signed)
Keep up a regular exercise program and make sure you are eating a healthy diet Try to eat 4 servings of dairy a day, or if you are lactose intolerant take a calcium with vitamin D daily.  Your vaccines are up to date.   

## 2016-09-26 DIAGNOSIS — Z0000 Encounter for general adult medical examination without abnormal findings: Secondary | ICD-10-CM | POA: Diagnosis not present

## 2016-09-26 LAB — CBC
HCT: 40.4 % (ref 35.0–45.0)
HEMOGLOBIN: 13.5 g/dL (ref 11.7–15.5)
MCH: 31.3 pg (ref 27.0–33.0)
MCHC: 33.4 g/dL (ref 32.0–36.0)
MCV: 93.7 fL (ref 80.0–100.0)
MPV: 9.5 fL (ref 7.5–12.5)
Platelets: 204 10*3/uL (ref 140–400)
RBC: 4.31 MIL/uL (ref 3.80–5.10)
RDW: 13.7 % (ref 11.0–15.0)
WBC: 4.7 10*3/uL (ref 3.8–10.8)

## 2016-09-26 LAB — COMPLETE METABOLIC PANEL WITH GFR
ALBUMIN: 4.1 g/dL (ref 3.6–5.1)
ALK PHOS: 59 U/L (ref 33–130)
ALT: 12 U/L (ref 6–29)
AST: 20 U/L (ref 10–35)
BUN: 11 mg/dL (ref 7–25)
CO2: 24 mmol/L (ref 20–31)
Calcium: 9.5 mg/dL (ref 8.6–10.4)
Chloride: 101 mmol/L (ref 98–110)
Creat: 0.73 mg/dL (ref 0.50–0.99)
GFR, Est African American: >89 mL/min (ref >=60)
Glucose, Bld: 78 mg/dL (ref 65–99)
POTASSIUM: 4.2 mmol/L (ref 3.5–5.3)
Sodium: 139 mmol/L (ref 135–146)
Total Bilirubin: 0.7 mg/dL (ref 0.2–1.2)
Total Protein: 6.6 g/dL (ref 6.1–8.1)

## 2016-09-26 LAB — LIPID PANEL W/REFLEX DIRECT LDL
Cholesterol: 210 mg/dL — ABNORMAL HIGH (ref <200)
HDL: 83 mg/dL (ref >50)
LDL-Cholesterol: 111 mg/dL — ABNORMAL HIGH
Non-HDL Cholesterol (Calc): 127 mg/dL (ref <130)
Total CHOL/HDL Ratio: 2.5 Ratio (ref <5.0)
Triglycerides: 73 mg/dL (ref <150)

## 2016-09-26 LAB — TSH: TSH: 0.82 mIU/L

## 2016-10-15 ENCOUNTER — Ambulatory Visit (INDEPENDENT_AMBULATORY_CARE_PROVIDER_SITE_OTHER): Payer: BLUE CROSS/BLUE SHIELD

## 2016-10-15 ENCOUNTER — Other Ambulatory Visit: Payer: Self-pay | Admitting: Family Medicine

## 2016-10-15 DIAGNOSIS — Z1231 Encounter for screening mammogram for malignant neoplasm of breast: Secondary | ICD-10-CM

## 2017-08-16 DIAGNOSIS — Z8719 Personal history of other diseases of the digestive system: Secondary | ICD-10-CM | POA: Diagnosis not present

## 2017-08-16 DIAGNOSIS — R112 Nausea with vomiting, unspecified: Secondary | ICD-10-CM | POA: Diagnosis not present

## 2017-08-16 DIAGNOSIS — R000 Tachycardia, unspecified: Secondary | ICD-10-CM | POA: Diagnosis not present

## 2017-08-31 ENCOUNTER — Encounter: Payer: Self-pay | Admitting: Family Medicine

## 2017-08-31 ENCOUNTER — Ambulatory Visit: Payer: BLUE CROSS/BLUE SHIELD | Admitting: Family Medicine

## 2017-08-31 VITALS — BP 112/67 | HR 71 | Temp 98.4°F | Resp 14 | Ht 69.0 in | Wt 122.5 lb

## 2017-08-31 DIAGNOSIS — Z0000 Encounter for general adult medical examination without abnormal findings: Secondary | ICD-10-CM | POA: Diagnosis not present

## 2017-08-31 DIAGNOSIS — K51919 Ulcerative colitis, unspecified with unspecified complications: Secondary | ICD-10-CM

## 2017-08-31 DIAGNOSIS — K5090 Crohn's disease, unspecified, without complications: Secondary | ICD-10-CM

## 2017-08-31 NOTE — Progress Notes (Signed)
Subjective:     Diane Chavez is a 62 y.o. female and is here for a comprehensive physical exam. The patient reports problems - GI issues. She was dx with Crohns years and then was told she had ulcerative colitis.  She is really not seen GI in several years since that she is only had about 2 flares.  Last colonoscopy was in January 2011.  She is been under a lot of stress recently because her daughter and son-in-law have moved back in with her.  They are both addicted to opioids and they have been trying to be supportive and helpful.  They have a grandson who is also living with them.  She feels like this may have been contributing to some of her dietary issues she is had decreased appetite.  No regular exercise.    Social History   Socioeconomic History  . Marital status: Married    Spouse name: [name]  . Number of children: 3  . Years of education: Not on file  . Highest education level: Not on file  Social Needs  . Financial resource strain: Not on file  . Food insecurity - worry: Not on file  . Food insecurity - inability: Not on file  . Transportation needs - medical: Not on file  . Transportation needs - non-medical: Not on file  Occupational History  . Occupation: Network engineer    Comment: [employer]  Tobacco Use  . Smoking status: Former Smoker    Last attempt to quit: 06/16/1974    Years since quitting: 43.2  . Smokeless tobacco: Never Used  Substance and Sexual Activity  . Alcohol use: Yes    Alcohol/week: 4.2 oz    Types: 7 Glasses of wine per week  . Drug use: No  . Sexual activity: Not on file  Other Topics Concern  . Not on file  Social History Narrative   Lives with husband.Regular exercise-no.   Health Maintenance  Topic Date Due  . PAP SMEAR  09/10/2017  . MAMMOGRAM  10/15/2017  . COLONOSCOPY  07/12/2019  . TETANUS/TDAP  11/01/2019  . INFLUENZA VACCINE  Completed  . Hepatitis C Screening  Completed  . HIV Screening  Completed    The following  portions of the patient's history were reviewed and updated as appropriate: allergies, current medications, past family history, past medical history, past social history, past surgical history and problem list.  Review of Systems A comprehensive review of systems was negative.   Objective:    BP 112/67   Pulse 71   Temp 98.4 F (36.9 C) (Oral)   Resp 14   Ht 5' 9"  (1.753 m)   Wt 122 lb 8 oz (55.6 kg)   LMP 06/11/2000   SpO2 96%   BMI 18.09 kg/m  General appearance: alert and cooperative Head: Normocephalic, without obvious abnormality, atraumatic Eyes: conj clear, EOMI, PEERLA Ears: normal TM's and external ear canals both ears Nose: Nares normal. Septum midline. Mucosa normal. No drainage or sinus tenderness. Throat: lips, mucosa, and tongue normal; teeth and gums normal Neck: no adenopathy, no carotid bruit, no JVD, supple, symmetrical, trachea midline and thyroid not enlarged, symmetric, no tenderness/mass/nodules Back: symmetric, no curvature. ROM normal. No CVA tenderness. Lungs: clear to auscultation bilaterally Breasts: normal appearance, no masses or tenderness Heart: regular rate and rhythm, S1, S2 normal, no murmur, click, rub or gallop Abdomen: soft, non-tender; bowel sounds normal; no masses,  no organomegaly Extremities: extremities normal, atraumatic, no cyanosis or edema Pulses: 2+  and symmetric Skin: Skin color, texture, turgor normal. No rashes or lesions Lymph nodes: Cervical, supraclavicular, and axillary nodes normal. Neurologic: Alert and oriented X 3, normal strength and tone. Normal symmetric reflexes. Normal coordination and gait    Assessment:    Healthy female exam.      Plan:     See After Visit Summary for Counseling Recommendations   Keep up a regular exercise program and make sure you are eating a healthy diet Try to eat 4 servings of dairy a day, or if you are lactose intolerant take a calcium with vitamin D daily.  Your vaccines are up to  date.  For CMP, lipid, TSH, CBC.  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  She has not been seen since about 2011 or 2012.  [facility] by Dr. Glennon Hamilton.  She is only had 2 flares since then.  Had flu vaccine back in early September.  Declined shingles vaccine as she never had chickenpox as a child.  Acute distress-secondary to family situation.  Did encourage her to make sure she is taking time for herself.  I think she would benefit from something like yoga since she does not exercise regularly.

## 2017-08-31 NOTE — Patient Instructions (Addendum)
Health Maintenance for Postmenopausal Women Menopause is a normal process in which your reproductive ability comes to an end. This process happens gradually over a span of months to years, usually between the ages of 22 and 9. Menopause is complete when you have missed 12 consecutive menstrual periods. It is important to talk with your health care provider about some of the most common conditions that affect postmenopausal women, such as heart disease, cancer, and bone loss (osteoporosis). Adopting a healthy lifestyle and getting preventive care can help to promote your health and wellness. Those actions can also lower your chances of developing some of these common conditions. What should I know about menopause? During menopause, you may experience a number of symptoms, such as:  Moderate-to-severe hot flashes.  Night sweats.  Decrease in sex drive.  Mood swings.  Headaches.  Tiredness.  Irritability.  Memory problems.  Insomnia.  Choosing to treat or not to treat menopausal changes is an individual decision that you make with your health care provider. What should I know about hormone replacement therapy and supplements? Hormone therapy products are effective for treating symptoms that are associated with menopause, such as hot flashes and night sweats. Hormone replacement carries certain risks, especially as you become older. If you are thinking about using estrogen or estrogen with progestin treatments, discuss the benefits and risks with your health care provider. What should I know about heart disease and stroke? Heart disease, heart attack, and stroke become more likely as you age. This may be due, in part, to the hormonal changes that your body experiences during menopause. These can affect how your body processes dietary fats, triglycerides, and cholesterol. Heart attack and stroke are both medical emergencies. There are many things that you can do to help prevent heart disease  and stroke:  Have your blood pressure checked at least every 1-2 years. High blood pressure causes heart disease and increases the risk of stroke.  If you are 53-22 years old, ask your health care provider if you should take aspirin to prevent a heart attack or a stroke.  Do not use any tobacco products, including cigarettes, chewing tobacco, or electronic cigarettes. If you need help quitting, ask your health care provider.  It is important to eat a healthy diet and maintain a healthy weight. ? Be sure to include plenty of vegetables, fruits, low-fat dairy products, and lean protein. ? Avoid eating foods that are high in solid fats, added sugars, or salt (sodium).  Get regular exercise. This is one of the most important things that you can do for your health. ? Try to exercise for at least 150 minutes each week. The type of exercise that you do should increase your heart rate and make you sweat. This is known as moderate-intensity exercise. ? Try to do strengthening exercises at least twice each week. Do these in addition to the moderate-intensity exercise.  Know your numbers.Ask your health care provider to check your cholesterol and your blood glucose. Continue to have your blood tested as directed by your health care provider.  What should I know about cancer screening? There are several types of cancer. Take the following steps to reduce your risk and to catch any cancer development as early as possible. Breast Cancer  Practice breast self-awareness. ? This means understanding how your breasts normally appear and feel. ? It also means doing regular breast self-exams. Let your health care provider know about any changes, no matter how small.  If you are 40  or older, have a clinician do a breast exam (clinical breast exam or CBE) every year. Depending on your age, family history, and medical history, it may be recommended that you also have a yearly breast X-ray (mammogram).  If you  have a family history of breast cancer, talk with your health care provider about genetic screening.  If you are at high risk for breast cancer, talk with your health care provider about having an MRI and a mammogram every year.  Breast cancer (BRCA) gene test is recommended for women who have family members with BRCA-related cancers. Results of the assessment will determine the need for genetic counseling and BRCA1 and for BRCA2 testing. BRCA-related cancers include these types: ? Breast. This occurs in males or females. ? Ovarian. ? Tubal. This may also be called fallopian tube cancer. ? Cancer of the abdominal or pelvic lining (peritoneal cancer). ? Prostate. ? Pancreatic.  Cervical, Uterine, and Ovarian Cancer Your health care provider may recommend that you be screened regularly for cancer of the pelvic organs. These include your ovaries, uterus, and vagina. This screening involves a pelvic exam, which includes checking for microscopic changes to the surface of your cervix (Pap test).  For women ages 21-65, health care providers may recommend a pelvic exam and a Pap test every three years. For women ages 79-65, they may recommend the Pap test and pelvic exam, combined with testing for human papilloma virus (HPV), every five years. Some types of HPV increase your risk of cervical cancer. Testing for HPV may also be done on women of any age who have unclear Pap test results.  Other health care providers may not recommend any screening for nonpregnant women who are considered low risk for pelvic cancer and have no symptoms. Ask your health care provider if a screening pelvic exam is right for you.  If you have had past treatment for cervical cancer or a condition that could lead to cancer, you need Pap tests and screening for cancer for at least 20 years after your treatment. If Pap tests have been discontinued for you, your risk factors (such as having a new sexual partner) need to be  reassessed to determine if you should start having screenings again. Some women have medical problems that increase the chance of getting cervical cancer. In these cases, your health care provider may recommend that you have screening and Pap tests more often.  If you have a family history of uterine cancer or ovarian cancer, talk with your health care provider about genetic screening.  If you have vaginal bleeding after reaching menopause, tell your health care provider.  There are currently no reliable tests available to screen for ovarian cancer.  Lung Cancer Lung cancer screening is recommended for adults 69-62 years old who are at high risk for lung cancer because of a history of smoking. A yearly low-dose CT scan of the lungs is recommended if you:  Currently smoke.  Have a history of at least 30 pack-years of smoking and you currently smoke or have quit within the past 15 years. A pack-year is smoking an average of one pack of cigarettes per day for one year.  Yearly screening should:  Continue until it has been 15 years since you quit.  Stop if you develop a health problem that would prevent you from having lung cancer treatment.  Colorectal Cancer  This type of cancer can be detected and can often be prevented.  Routine colorectal cancer screening usually begins at  age 42 and continues through age 45.  If you have risk factors for colon cancer, your health care provider may recommend that you be screened at an earlier age.  If you have a family history of colorectal cancer, talk with your health care provider about genetic screening.  Your health care provider may also recommend using home test kits to check for hidden blood in your stool.  A small camera at the end of a tube can be used to examine your colon directly (sigmoidoscopy or colonoscopy). This is done to check for the earliest forms of colorectal cancer.  Direct examination of the colon should be repeated every  5-10 years until age 71. However, if early forms of precancerous polyps or small growths are found or if you have a family history or genetic risk for colorectal cancer, you may need to be screened more often.  Skin Cancer  Check your skin from head to toe regularly.  Monitor any moles. Be sure to tell your health care provider: ? About any new moles or changes in moles, especially if there is a change in a mole's shape or color. ? If you have a mole that is larger than the size of a pencil eraser.  If any of your family members has a history of skin cancer, especially at a young age, talk with your health care provider about genetic screening.  Always use sunscreen. Apply sunscreen liberally and repeatedly throughout the day.  Whenever you are outside, protect yourself by wearing long sleeves, pants, a wide-brimmed hat, and sunglasses.  What should I know about osteoporosis? Osteoporosis is a condition in which bone destruction happens more quickly than new bone creation. After menopause, you may be at an increased risk for osteoporosis. To help prevent osteoporosis or the bone fractures that can happen because of osteoporosis, the following is recommended:  If you are 46-71 years old, get at least 1,000 mg of calcium and at least 600 mg of vitamin D per day.  If you are older than age 55 but younger than age 65, get at least 1,200 mg of calcium and at least 600 mg of vitamin D per day.  If you are older than age 54, get at least 1,200 mg of calcium and at least 800 mg of vitamin D per day.  Smoking and excessive alcohol intake increase the risk of osteoporosis. Eat foods that are rich in calcium and vitamin D, and do weight-bearing exercises several times each week as directed by your health care provider. What should I know about how menopause affects my mental health? Depression may occur at any age, but it is more common as you become older. Common symptoms of depression  include:  Low or sad mood.  Changes in sleep patterns.  Changes in appetite or eating patterns.  Feeling an overall lack of motivation or enjoyment of activities that you previously enjoyed.  Frequent crying spells.  Talk with your health care provider if you think that you are experiencing depression. What should I know about immunizations? It is important that you get and maintain your immunizations. These include:  Tetanus, diphtheria, and pertussis (Tdap) booster vaccine.  Influenza every year before the flu season begins.  Pneumonia vaccine.  Shingles vaccine.  Your health care provider may also recommend other immunizations. This information is not intended to replace advice given to you by your health care provider. Make sure you discuss any questions you have with your health care provider. Document Released: 07/25/2005  Document Revised: 12/21/2015 Document Reviewed: 03/06/2015 Elsevier Interactive Patient Education  2018 Elsevier Inc.  

## 2017-09-02 DIAGNOSIS — K5900 Constipation, unspecified: Secondary | ICD-10-CM | POA: Diagnosis not present

## 2017-09-02 DIAGNOSIS — R110 Nausea: Secondary | ICD-10-CM | POA: Diagnosis not present

## 2017-09-02 DIAGNOSIS — K5100 Ulcerative (chronic) pancolitis without complications: Secondary | ICD-10-CM | POA: Diagnosis not present

## 2017-09-14 DIAGNOSIS — M25551 Pain in right hip: Secondary | ICD-10-CM | POA: Diagnosis not present

## 2017-09-14 DIAGNOSIS — M9903 Segmental and somatic dysfunction of lumbar region: Secondary | ICD-10-CM | POA: Diagnosis not present

## 2017-09-14 DIAGNOSIS — M9905 Segmental and somatic dysfunction of pelvic region: Secondary | ICD-10-CM | POA: Diagnosis not present

## 2017-09-14 DIAGNOSIS — M545 Low back pain: Secondary | ICD-10-CM | POA: Diagnosis not present

## 2017-09-18 DIAGNOSIS — M545 Low back pain: Secondary | ICD-10-CM | POA: Diagnosis not present

## 2017-09-18 DIAGNOSIS — M25551 Pain in right hip: Secondary | ICD-10-CM | POA: Diagnosis not present

## 2017-09-18 DIAGNOSIS — M9903 Segmental and somatic dysfunction of lumbar region: Secondary | ICD-10-CM | POA: Diagnosis not present

## 2017-09-18 DIAGNOSIS — M9905 Segmental and somatic dysfunction of pelvic region: Secondary | ICD-10-CM | POA: Diagnosis not present

## 2017-09-21 DIAGNOSIS — M9905 Segmental and somatic dysfunction of pelvic region: Secondary | ICD-10-CM | POA: Diagnosis not present

## 2017-09-21 DIAGNOSIS — M25551 Pain in right hip: Secondary | ICD-10-CM | POA: Diagnosis not present

## 2017-09-21 DIAGNOSIS — M545 Low back pain: Secondary | ICD-10-CM | POA: Diagnosis not present

## 2017-09-21 DIAGNOSIS — M9903 Segmental and somatic dysfunction of lumbar region: Secondary | ICD-10-CM | POA: Diagnosis not present

## 2017-09-23 DIAGNOSIS — M25551 Pain in right hip: Secondary | ICD-10-CM | POA: Diagnosis not present

## 2017-09-23 DIAGNOSIS — M545 Low back pain: Secondary | ICD-10-CM | POA: Diagnosis not present

## 2017-09-23 DIAGNOSIS — M9905 Segmental and somatic dysfunction of pelvic region: Secondary | ICD-10-CM | POA: Diagnosis not present

## 2017-09-23 DIAGNOSIS — M9903 Segmental and somatic dysfunction of lumbar region: Secondary | ICD-10-CM | POA: Diagnosis not present

## 2017-09-25 DIAGNOSIS — M9905 Segmental and somatic dysfunction of pelvic region: Secondary | ICD-10-CM | POA: Diagnosis not present

## 2017-09-25 DIAGNOSIS — M9903 Segmental and somatic dysfunction of lumbar region: Secondary | ICD-10-CM | POA: Diagnosis not present

## 2017-09-25 DIAGNOSIS — M545 Low back pain: Secondary | ICD-10-CM | POA: Diagnosis not present

## 2017-09-25 DIAGNOSIS — M25551 Pain in right hip: Secondary | ICD-10-CM | POA: Diagnosis not present

## 2017-09-29 DIAGNOSIS — M9905 Segmental and somatic dysfunction of pelvic region: Secondary | ICD-10-CM | POA: Diagnosis not present

## 2017-09-29 DIAGNOSIS — M25551 Pain in right hip: Secondary | ICD-10-CM | POA: Diagnosis not present

## 2017-09-29 DIAGNOSIS — M9903 Segmental and somatic dysfunction of lumbar region: Secondary | ICD-10-CM | POA: Diagnosis not present

## 2017-09-29 DIAGNOSIS — M545 Low back pain: Secondary | ICD-10-CM | POA: Diagnosis not present

## 2017-09-30 ENCOUNTER — Ambulatory Visit (INDEPENDENT_AMBULATORY_CARE_PROVIDER_SITE_OTHER): Payer: BLUE CROSS/BLUE SHIELD

## 2017-09-30 ENCOUNTER — Encounter: Payer: Self-pay | Admitting: Sports Medicine

## 2017-09-30 ENCOUNTER — Ambulatory Visit: Payer: BLUE CROSS/BLUE SHIELD | Admitting: Sports Medicine

## 2017-09-30 DIAGNOSIS — M25462 Effusion, left knee: Secondary | ICD-10-CM

## 2017-09-30 DIAGNOSIS — M25562 Pain in left knee: Secondary | ICD-10-CM

## 2017-09-30 DIAGNOSIS — M1712 Unilateral primary osteoarthritis, left knee: Secondary | ICD-10-CM | POA: Diagnosis not present

## 2017-09-30 DIAGNOSIS — S82142A Displaced bicondylar fracture of left tibia, initial encounter for closed fracture: Secondary | ICD-10-CM | POA: Insufficient documentation

## 2017-09-30 DIAGNOSIS — M25561 Pain in right knee: Secondary | ICD-10-CM | POA: Diagnosis not present

## 2017-09-30 MED ORDER — MELOXICAM 15 MG PO TABS: ORAL_TABLET | ORAL | 3 refills | Status: DC

## 2017-09-30 NOTE — Progress Notes (Signed)
Subjective:    I'm seeing this patient as a consultation for: Dr. Beatrice Lecher  CC: Left knee swelling  HPI: Diane Chavez is a very pleasant 62 year old female, for the past several weeks she has had increasing pain and swelling in her left knee, particularly behind the knee, a sensation of fullness with occasional popping at the medial joint line, no known trauma, no constitutional symptoms.  Moderate, persistent.  She was seen by her chiropractor who suggested she might have a Baker's cyst, and she is referred to me for further evaluation and definitive treatment.  I reviewed the past medical history, family history, social history, surgical history, and allergies today and no changes were needed.  Please see the problem list section below in epic for further details.  Past Medical History: Past Medical History:  Diagnosis Date  . Anxiety   . Crohn disease ([place])    Dr. Laverta Baltimore  . Depression    on prozac previously  . Hyperlipidemia   . Miscarriage    Past Surgical History: Past Surgical History:  Procedure Laterality Date  . CARDIAC SURGERY     as a child  . EYE SURGERY  10/30/2009   repair of detached retina  . TONSILLECTOMY  1965   Social History: Social History   Socioeconomic History  . Marital status: Married    Spouse name: [name]  . Number of children: 3  . Years of education: Not on file  . Highest education level: Not on file  Occupational History  . 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 Financial resource strain: Not on file  . Food insecurity:    Worry: Not on file    Inability: Not on file  . Transportation needs:    Medical: Not on file    Non-medical: Not on file  Tobacco Use  . Smoking status: Former Smoker    Last attempt to quit: 06/16/1974    Years since quitting: 43.3  . Smokeless tobacco: Never Used  Substance and Sexual Activity  . Alcohol use: Yes    Alcohol/week: 4.2 oz    Types: 7 Glasses of wine per week  .  Drug use: No  . Sexual activity: Not on file  Lifestyle  . Physical activity:    Days per week: Not on file    Minutes per session: Not on file  . Stress: Not on file  Relationships  . Social connections:    Talks on phone: Not on file    Gets together: Not on file    Attends religious service: Not on file    Active member of club or organization: Not on file    Attends meetings of clubs or organizations: Not on file    Relationship status: Not on file  Other Topics Concern  . Not on file  Social History Narrative   Lives with husband.Regular exercise-no.   Family History: Family History  Problem Relation Age of Onset  . Alzheimer's disease Mother    Allergies: No Known Allergies Medications: See med rec.  Review of Systems: No headache, visual changes, nausea, vomiting, diarrhea, constipation, dizziness, abdominal pain, skin rash, fevers, chills, night sweats, weight loss, swollen lymph nodes, body aches, joint swelling, muscle aches, chest pain, shortness of breath, mood changes, visual or auditory hallucinations.   Objective:   General: Well Developed, well nourished, and in no acute distress.  Neuro:  Extra-ocular muscles intact, able to move all 4 extremities, sensation grossly intact.  Deep tendon reflexes tested were normal. Psych: Alert and oriented, mood congruent with affect. ENT:  Ears and nose appear unremarkable.  Hearing grossly normal. Neck: Unremarkable overall appearance, trachea midline.  No visible thyroid enlargement. Eyes: Conjunctivae and lids appear unremarkable.  Pupils equal and round. Skin: Warm and dry, no rashes noted.  Cardiovascular: Pulses palpable, no extremity edema. Left knee: Normal to inspection with no erythema or effusion or obvious bony abnormalities. Mild swelling, tenderness at the medial joint line Palpable Baker's cyst ROM normal in flexion and extension and lower leg rotation. Ligaments with solid consistent endpoints including  ACL, PCL, LCL, MCL. Negative Mcmurray's and provocative meniscal tests. Non painful patellar compression. Patellar and quadriceps tendons unremarkable. Hamstring and quadriceps strength is normal.  Procedure: Real-time Ultrasound Guided aspiration/injection of left knee Baker's cyst Device: GE Logiq E  Verbal informed consent obtained.  Time-out conducted.  Noted no overlying erythema, induration, or other signs of local infection.  Skin prepped in a sterile fashion.  Local anesthesia: Topical Ethyl chloride.  With sterile technique and under real time ultrasound guidance: Using an 18-gauge needle I aspirated a few milliliters of straw-colored fluid, syringe switched and 1 cc kenalog 40, 1 cc lidocaine injected easily Completed without difficulty  Pain immediately resolved suggesting accurate placement of the medication.  Advised to call if fevers/chills, erythema, induration, drainage, or persistent bleeding.  Images permanently stored and available for review in the ultrasound unit.  Impression: Technically successful ultrasound guided injection.  The knee was then strapped with a compressive dressing.  Impression and Recommendations:   This case required medical decision making of moderate complexity.  Left knee swelling with Baker's cyst Aspiration and injection of Baker's cyst, x-rays. Meloxicam as needed. Return to see me in 1 month, if persistent symptoms we will proceed with MRI, I do think she has a meniscal tear as well. ___________________________________________ Gwen Her. [name], M.D., ABFM., CAQSM. Primary Care and [place] Instructor of [place] [facility]

## 2017-09-30 NOTE — Assessment & Plan Note (Signed)
Aspiration and injection of Baker's cyst, x-rays. Meloxicam as needed. Return to see me in 1 month, if persistent symptoms we will proceed with MRI, I do think she has a meniscal tear as well.

## 2017-10-06 DIAGNOSIS — M545 Low back pain: Secondary | ICD-10-CM | POA: Diagnosis not present

## 2017-10-06 DIAGNOSIS — M25551 Pain in right hip: Secondary | ICD-10-CM | POA: Diagnosis not present

## 2017-10-06 DIAGNOSIS — M9903 Segmental and somatic dysfunction of lumbar region: Secondary | ICD-10-CM | POA: Diagnosis not present

## 2017-10-06 DIAGNOSIS — M9905 Segmental and somatic dysfunction of pelvic region: Secondary | ICD-10-CM | POA: Diagnosis not present

## 2017-10-13 DIAGNOSIS — M25551 Pain in right hip: Secondary | ICD-10-CM | POA: Diagnosis not present

## 2017-10-13 DIAGNOSIS — M545 Low back pain: Secondary | ICD-10-CM | POA: Diagnosis not present

## 2017-10-13 DIAGNOSIS — M9903 Segmental and somatic dysfunction of lumbar region: Secondary | ICD-10-CM | POA: Diagnosis not present

## 2017-10-13 DIAGNOSIS — M9905 Segmental and somatic dysfunction of pelvic region: Secondary | ICD-10-CM | POA: Diagnosis not present

## 2017-10-15 DIAGNOSIS — Z1329 Encounter for screening for other suspected endocrine disorder: Secondary | ICD-10-CM | POA: Diagnosis not present

## 2017-10-15 DIAGNOSIS — Z0000 Encounter for general adult medical examination without abnormal findings: Secondary | ICD-10-CM | POA: Diagnosis not present

## 2017-10-15 DIAGNOSIS — Z1321 Encounter for screening for nutritional disorder: Secondary | ICD-10-CM | POA: Diagnosis not present

## 2017-10-15 LAB — COMPLETE METABOLIC PANEL WITH GFR
AG Ratio: 2 (calc) (ref 1.0–2.5)
ALT: 11 U/L (ref 6–29)
AST: 19 U/L (ref 10–35)
Albumin: 4.7 g/dL (ref 3.6–5.1)
Alkaline phosphatase (APISO): 59 U/L (ref 33–130)
BILIRUBIN TOTAL: 0.6 mg/dL (ref 0.2–1.2)
BUN: 14 mg/dL (ref 7–25)
CHLORIDE: 101 mmol/L (ref 98–110)
CO2: 30 mmol/L (ref 20–32)
CREATININE: 0.84 mg/dL (ref 0.50–0.99)
Calcium: 9.7 mg/dL (ref 8.6–10.4)
GFR, Est African American: 87 mL/min/{1.73_m2} (ref >=60)
GFR, Est Non African American: 75 mL/min/{1.73_m2} (ref >=60)
GLUCOSE: 87 mg/dL (ref 65–99)
Globulin: 2.3 g/dL (calc) (ref 1.9–3.7)
Potassium: 4.4 mmol/L (ref 3.5–5.3)
Sodium: 137 mmol/L (ref 135–146)
Total Protein: 7 g/dL (ref 6.1–8.1)

## 2017-10-15 LAB — CBC
HCT: 41.9 % (ref 35.0–45.0)
Hemoglobin: 14.3 g/dL (ref 11.7–15.5)
MCH: 31.2 pg (ref 27.0–33.0)
MCHC: 34.1 g/dL (ref 32.0–36.0)
MCV: 91.5 fL (ref 80.0–100.0)
MPV: 9.5 fL (ref 7.5–12.5)
Platelets: 213 Thousand/uL (ref 140–400)
RBC: 4.58 Million/uL (ref 3.80–5.10)
RDW: 12.1 % (ref 11.0–15.0)
WBC: 6.4 Thousand/uL (ref 3.8–10.8)

## 2017-10-15 LAB — LIPID PANEL
Cholesterol: 235 mg/dL — ABNORMAL HIGH
HDL: 83 mg/dL
LDL Cholesterol (Calc): 138 mg/dL — ABNORMAL HIGH
Non-HDL Cholesterol (Calc): 152 mg/dL — ABNORMAL HIGH
Total CHOL/HDL Ratio: 2.8 (calc)
Triglycerides: 46 mg/dL

## 2017-10-15 LAB — TSH: TSH: 0.58 m[IU]/L (ref 0.40–4.50)

## 2017-10-16 LAB — VITAMIN D 25 HYDROXY (VIT D DEFICIENCY, FRACTURES): Vit D, 25-Hydroxy: 34 ng/mL (ref 30–100)

## 2017-10-20 DIAGNOSIS — M25551 Pain in right hip: Secondary | ICD-10-CM | POA: Diagnosis not present

## 2017-10-20 DIAGNOSIS — M9905 Segmental and somatic dysfunction of pelvic region: Secondary | ICD-10-CM | POA: Diagnosis not present

## 2017-10-20 DIAGNOSIS — M545 Low back pain: Secondary | ICD-10-CM | POA: Diagnosis not present

## 2017-10-20 DIAGNOSIS — M9903 Segmental and somatic dysfunction of lumbar region: Secondary | ICD-10-CM | POA: Diagnosis not present

## 2017-10-28 ENCOUNTER — Ambulatory Visit: Payer: BLUE CROSS/BLUE SHIELD | Admitting: Sports Medicine

## 2017-11-03 DIAGNOSIS — M25551 Pain in right hip: Secondary | ICD-10-CM | POA: Diagnosis not present

## 2017-11-03 DIAGNOSIS — M545 Low back pain: Secondary | ICD-10-CM | POA: Diagnosis not present

## 2017-11-03 DIAGNOSIS — M9905 Segmental and somatic dysfunction of pelvic region: Secondary | ICD-10-CM | POA: Diagnosis not present

## 2017-11-03 DIAGNOSIS — M9903 Segmental and somatic dysfunction of lumbar region: Secondary | ICD-10-CM | POA: Diagnosis not present

## 2017-11-10 DIAGNOSIS — M25551 Pain in right hip: Secondary | ICD-10-CM | POA: Diagnosis not present

## 2017-11-10 DIAGNOSIS — M545 Low back pain: Secondary | ICD-10-CM | POA: Diagnosis not present

## 2017-11-10 DIAGNOSIS — M9903 Segmental and somatic dysfunction of lumbar region: Secondary | ICD-10-CM | POA: Diagnosis not present

## 2017-11-10 DIAGNOSIS — M9905 Segmental and somatic dysfunction of pelvic region: Secondary | ICD-10-CM | POA: Diagnosis not present

## 2017-11-17 DIAGNOSIS — M9905 Segmental and somatic dysfunction of pelvic region: Secondary | ICD-10-CM | POA: Diagnosis not present

## 2017-11-17 DIAGNOSIS — M545 Low back pain: Secondary | ICD-10-CM | POA: Diagnosis not present

## 2017-11-17 DIAGNOSIS — M9903 Segmental and somatic dysfunction of lumbar region: Secondary | ICD-10-CM | POA: Diagnosis not present

## 2017-11-17 DIAGNOSIS — M25551 Pain in right hip: Secondary | ICD-10-CM | POA: Diagnosis not present

## 2017-11-24 DIAGNOSIS — M9901 Segmental and somatic dysfunction of cervical region: Secondary | ICD-10-CM | POA: Diagnosis not present

## 2017-11-24 DIAGNOSIS — M9903 Segmental and somatic dysfunction of lumbar region: Secondary | ICD-10-CM | POA: Diagnosis not present

## 2017-11-24 DIAGNOSIS — M9905 Segmental and somatic dysfunction of pelvic region: Secondary | ICD-10-CM | POA: Diagnosis not present

## 2017-11-24 DIAGNOSIS — M9902 Segmental and somatic dysfunction of thoracic region: Secondary | ICD-10-CM | POA: Diagnosis not present

## 2017-11-25 DIAGNOSIS — K5900 Constipation, unspecified: Secondary | ICD-10-CM | POA: Diagnosis not present

## 2017-11-25 DIAGNOSIS — K219 Gastro-esophageal reflux disease without esophagitis: Secondary | ICD-10-CM | POA: Diagnosis not present

## 2017-11-25 DIAGNOSIS — K51919 Ulcerative colitis, unspecified with unspecified complications: Secondary | ICD-10-CM | POA: Diagnosis not present

## 2017-11-25 DIAGNOSIS — R112 Nausea with vomiting, unspecified: Secondary | ICD-10-CM | POA: Diagnosis not present

## 2017-12-01 DIAGNOSIS — K228 Other specified diseases of esophagus: Secondary | ICD-10-CM | POA: Diagnosis not present

## 2017-12-01 DIAGNOSIS — M545 Low back pain: Secondary | ICD-10-CM | POA: Diagnosis not present

## 2017-12-01 DIAGNOSIS — M9903 Segmental and somatic dysfunction of lumbar region: Secondary | ICD-10-CM | POA: Diagnosis not present

## 2017-12-01 DIAGNOSIS — R1310 Dysphagia, unspecified: Secondary | ICD-10-CM | POA: Diagnosis not present

## 2017-12-01 DIAGNOSIS — M9905 Segmental and somatic dysfunction of pelvic region: Secondary | ICD-10-CM | POA: Diagnosis not present

## 2017-12-01 DIAGNOSIS — K224 Dyskinesia of esophagus: Secondary | ICD-10-CM | POA: Diagnosis not present

## 2017-12-01 DIAGNOSIS — M25551 Pain in right hip: Secondary | ICD-10-CM | POA: Diagnosis not present

## 2017-12-07 DIAGNOSIS — M9903 Segmental and somatic dysfunction of lumbar region: Secondary | ICD-10-CM | POA: Diagnosis not present

## 2017-12-07 DIAGNOSIS — M545 Low back pain: Secondary | ICD-10-CM | POA: Diagnosis not present

## 2017-12-07 DIAGNOSIS — M9905 Segmental and somatic dysfunction of pelvic region: Secondary | ICD-10-CM | POA: Diagnosis not present

## 2017-12-07 DIAGNOSIS — M25551 Pain in right hip: Secondary | ICD-10-CM | POA: Diagnosis not present

## 2017-12-07 DIAGNOSIS — M9902 Segmental and somatic dysfunction of thoracic region: Secondary | ICD-10-CM | POA: Diagnosis not present

## 2017-12-07 DIAGNOSIS — M9901 Segmental and somatic dysfunction of cervical region: Secondary | ICD-10-CM | POA: Diagnosis not present

## 2017-12-15 DIAGNOSIS — M25551 Pain in right hip: Secondary | ICD-10-CM | POA: Diagnosis not present

## 2017-12-15 DIAGNOSIS — M545 Low back pain: Secondary | ICD-10-CM | POA: Diagnosis not present

## 2017-12-15 DIAGNOSIS — M9903 Segmental and somatic dysfunction of lumbar region: Secondary | ICD-10-CM | POA: Diagnosis not present

## 2017-12-15 DIAGNOSIS — M9905 Segmental and somatic dysfunction of pelvic region: Secondary | ICD-10-CM | POA: Diagnosis not present

## 2017-12-22 DIAGNOSIS — M25551 Pain in right hip: Secondary | ICD-10-CM | POA: Diagnosis not present

## 2017-12-22 DIAGNOSIS — M545 Low back pain: Secondary | ICD-10-CM | POA: Diagnosis not present

## 2017-12-22 DIAGNOSIS — M9905 Segmental and somatic dysfunction of pelvic region: Secondary | ICD-10-CM | POA: Diagnosis not present

## 2017-12-22 DIAGNOSIS — M9903 Segmental and somatic dysfunction of lumbar region: Secondary | ICD-10-CM | POA: Diagnosis not present

## 2018-01-19 DIAGNOSIS — M545 Low back pain: Secondary | ICD-10-CM | POA: Diagnosis not present

## 2018-01-19 DIAGNOSIS — M9903 Segmental and somatic dysfunction of lumbar region: Secondary | ICD-10-CM | POA: Diagnosis not present

## 2018-01-19 DIAGNOSIS — M9905 Segmental and somatic dysfunction of pelvic region: Secondary | ICD-10-CM | POA: Diagnosis not present

## 2018-01-19 DIAGNOSIS — M25551 Pain in right hip: Secondary | ICD-10-CM | POA: Diagnosis not present

## 2018-02-09 DIAGNOSIS — M25551 Pain in right hip: Secondary | ICD-10-CM | POA: Diagnosis not present

## 2018-02-09 DIAGNOSIS — M545 Low back pain: Secondary | ICD-10-CM | POA: Diagnosis not present

## 2018-02-09 DIAGNOSIS — M9903 Segmental and somatic dysfunction of lumbar region: Secondary | ICD-10-CM | POA: Diagnosis not present

## 2018-02-09 DIAGNOSIS — M9905 Segmental and somatic dysfunction of pelvic region: Secondary | ICD-10-CM | POA: Diagnosis not present

## 2018-02-16 DIAGNOSIS — M9905 Segmental and somatic dysfunction of pelvic region: Secondary | ICD-10-CM | POA: Diagnosis not present

## 2018-02-16 DIAGNOSIS — M9903 Segmental and somatic dysfunction of lumbar region: Secondary | ICD-10-CM | POA: Diagnosis not present

## 2018-02-16 DIAGNOSIS — M25551 Pain in right hip: Secondary | ICD-10-CM | POA: Diagnosis not present

## 2018-02-16 DIAGNOSIS — M545 Low back pain: Secondary | ICD-10-CM | POA: Diagnosis not present

## 2018-02-20 DIAGNOSIS — M25551 Pain in right hip: Secondary | ICD-10-CM | POA: Diagnosis not present

## 2018-02-20 DIAGNOSIS — M9901 Segmental and somatic dysfunction of cervical region: Secondary | ICD-10-CM | POA: Diagnosis not present

## 2018-02-20 DIAGNOSIS — M9903 Segmental and somatic dysfunction of lumbar region: Secondary | ICD-10-CM | POA: Diagnosis not present

## 2018-02-20 DIAGNOSIS — M9905 Segmental and somatic dysfunction of pelvic region: Secondary | ICD-10-CM | POA: Diagnosis not present

## 2018-02-20 DIAGNOSIS — M9902 Segmental and somatic dysfunction of thoracic region: Secondary | ICD-10-CM | POA: Diagnosis not present

## 2018-02-20 DIAGNOSIS — M545 Low back pain: Secondary | ICD-10-CM | POA: Diagnosis not present

## 2018-02-27 DIAGNOSIS — M9903 Segmental and somatic dysfunction of lumbar region: Secondary | ICD-10-CM | POA: Diagnosis not present

## 2018-02-27 DIAGNOSIS — M25551 Pain in right hip: Secondary | ICD-10-CM | POA: Diagnosis not present

## 2018-02-27 DIAGNOSIS — M545 Low back pain: Secondary | ICD-10-CM | POA: Diagnosis not present

## 2018-02-27 DIAGNOSIS — M9905 Segmental and somatic dysfunction of pelvic region: Secondary | ICD-10-CM | POA: Diagnosis not present

## 2018-03-06 DIAGNOSIS — M545 Low back pain: Secondary | ICD-10-CM | POA: Diagnosis not present

## 2018-03-06 DIAGNOSIS — M9903 Segmental and somatic dysfunction of lumbar region: Secondary | ICD-10-CM | POA: Diagnosis not present

## 2018-03-06 DIAGNOSIS — M9902 Segmental and somatic dysfunction of thoracic region: Secondary | ICD-10-CM | POA: Diagnosis not present

## 2018-03-06 DIAGNOSIS — M9905 Segmental and somatic dysfunction of pelvic region: Secondary | ICD-10-CM | POA: Diagnosis not present

## 2018-03-06 DIAGNOSIS — M25551 Pain in right hip: Secondary | ICD-10-CM | POA: Diagnosis not present

## 2018-03-06 DIAGNOSIS — M9901 Segmental and somatic dysfunction of cervical region: Secondary | ICD-10-CM | POA: Diagnosis not present

## 2018-03-12 DIAGNOSIS — R197 Diarrhea, unspecified: Secondary | ICD-10-CM | POA: Diagnosis not present

## 2018-03-12 DIAGNOSIS — R194 Change in bowel habit: Secondary | ICD-10-CM | POA: Diagnosis not present

## 2018-03-12 DIAGNOSIS — K51011 Ulcerative (chronic) pancolitis with rectal bleeding: Secondary | ICD-10-CM | POA: Diagnosis not present

## 2018-03-12 DIAGNOSIS — R1032 Left lower quadrant pain: Secondary | ICD-10-CM | POA: Diagnosis not present

## 2018-03-12 DIAGNOSIS — K921 Melena: Secondary | ICD-10-CM | POA: Diagnosis not present

## 2018-03-16 DIAGNOSIS — M25551 Pain in right hip: Secondary | ICD-10-CM | POA: Diagnosis not present

## 2018-03-16 DIAGNOSIS — M9901 Segmental and somatic dysfunction of cervical region: Secondary | ICD-10-CM | POA: Diagnosis not present

## 2018-03-16 DIAGNOSIS — M9903 Segmental and somatic dysfunction of lumbar region: Secondary | ICD-10-CM | POA: Diagnosis not present

## 2018-03-16 DIAGNOSIS — M545 Low back pain: Secondary | ICD-10-CM | POA: Diagnosis not present

## 2018-03-16 DIAGNOSIS — M9905 Segmental and somatic dysfunction of pelvic region: Secondary | ICD-10-CM | POA: Diagnosis not present

## 2018-03-16 DIAGNOSIS — M9902 Segmental and somatic dysfunction of thoracic region: Secondary | ICD-10-CM | POA: Diagnosis not present

## 2018-03-23 DIAGNOSIS — M9905 Segmental and somatic dysfunction of pelvic region: Secondary | ICD-10-CM | POA: Diagnosis not present

## 2018-03-23 DIAGNOSIS — M545 Low back pain: Secondary | ICD-10-CM | POA: Diagnosis not present

## 2018-03-23 DIAGNOSIS — M25551 Pain in right hip: Secondary | ICD-10-CM | POA: Diagnosis not present

## 2018-03-23 DIAGNOSIS — M9902 Segmental and somatic dysfunction of thoracic region: Secondary | ICD-10-CM | POA: Diagnosis not present

## 2018-03-23 DIAGNOSIS — M9901 Segmental and somatic dysfunction of cervical region: Secondary | ICD-10-CM | POA: Diagnosis not present

## 2018-03-23 DIAGNOSIS — M9903 Segmental and somatic dysfunction of lumbar region: Secondary | ICD-10-CM | POA: Diagnosis not present

## 2018-03-31 DIAGNOSIS — M9903 Segmental and somatic dysfunction of lumbar region: Secondary | ICD-10-CM | POA: Diagnosis not present

## 2018-03-31 DIAGNOSIS — M9902 Segmental and somatic dysfunction of thoracic region: Secondary | ICD-10-CM | POA: Diagnosis not present

## 2018-03-31 DIAGNOSIS — M9905 Segmental and somatic dysfunction of pelvic region: Secondary | ICD-10-CM | POA: Diagnosis not present

## 2018-03-31 DIAGNOSIS — M545 Low back pain: Secondary | ICD-10-CM | POA: Diagnosis not present

## 2018-03-31 DIAGNOSIS — M25551 Pain in right hip: Secondary | ICD-10-CM | POA: Diagnosis not present

## 2018-03-31 DIAGNOSIS — M9901 Segmental and somatic dysfunction of cervical region: Secondary | ICD-10-CM | POA: Diagnosis not present

## 2018-04-06 DIAGNOSIS — H43811 Vitreous degeneration, right eye: Secondary | ICD-10-CM | POA: Diagnosis not present

## 2018-04-09 DIAGNOSIS — M9905 Segmental and somatic dysfunction of pelvic region: Secondary | ICD-10-CM | POA: Diagnosis not present

## 2018-04-09 DIAGNOSIS — M545 Low back pain: Secondary | ICD-10-CM | POA: Diagnosis not present

## 2018-04-09 DIAGNOSIS — M25551 Pain in right hip: Secondary | ICD-10-CM | POA: Diagnosis not present

## 2018-04-09 DIAGNOSIS — M9903 Segmental and somatic dysfunction of lumbar region: Secondary | ICD-10-CM | POA: Diagnosis not present

## 2018-04-15 DIAGNOSIS — K219 Gastro-esophageal reflux disease without esophagitis: Secondary | ICD-10-CM | POA: Diagnosis not present

## 2018-04-15 DIAGNOSIS — K51011 Ulcerative (chronic) pancolitis with rectal bleeding: Secondary | ICD-10-CM | POA: Diagnosis not present

## 2018-04-17 DIAGNOSIS — M545 Low back pain: Secondary | ICD-10-CM | POA: Diagnosis not present

## 2018-04-17 DIAGNOSIS — M9903 Segmental and somatic dysfunction of lumbar region: Secondary | ICD-10-CM | POA: Diagnosis not present

## 2018-04-17 DIAGNOSIS — M25551 Pain in right hip: Secondary | ICD-10-CM | POA: Diagnosis not present

## 2018-04-17 DIAGNOSIS — M9902 Segmental and somatic dysfunction of thoracic region: Secondary | ICD-10-CM | POA: Diagnosis not present

## 2018-04-17 DIAGNOSIS — M9901 Segmental and somatic dysfunction of cervical region: Secondary | ICD-10-CM | POA: Diagnosis not present

## 2018-04-17 DIAGNOSIS — M9905 Segmental and somatic dysfunction of pelvic region: Secondary | ICD-10-CM | POA: Diagnosis not present

## 2018-04-21 DIAGNOSIS — M9903 Segmental and somatic dysfunction of lumbar region: Secondary | ICD-10-CM | POA: Diagnosis not present

## 2018-04-21 DIAGNOSIS — M9902 Segmental and somatic dysfunction of thoracic region: Secondary | ICD-10-CM | POA: Diagnosis not present

## 2018-04-21 DIAGNOSIS — M9905 Segmental and somatic dysfunction of pelvic region: Secondary | ICD-10-CM | POA: Diagnosis not present

## 2018-04-21 DIAGNOSIS — M25551 Pain in right hip: Secondary | ICD-10-CM | POA: Diagnosis not present

## 2018-04-21 DIAGNOSIS — M9901 Segmental and somatic dysfunction of cervical region: Secondary | ICD-10-CM | POA: Diagnosis not present

## 2018-04-21 DIAGNOSIS — M545 Low back pain: Secondary | ICD-10-CM | POA: Diagnosis not present

## 2018-04-27 DIAGNOSIS — M9901 Segmental and somatic dysfunction of cervical region: Secondary | ICD-10-CM | POA: Diagnosis not present

## 2018-04-27 DIAGNOSIS — M545 Low back pain: Secondary | ICD-10-CM | POA: Diagnosis not present

## 2018-04-27 DIAGNOSIS — M25551 Pain in right hip: Secondary | ICD-10-CM | POA: Diagnosis not present

## 2018-04-27 DIAGNOSIS — M9905 Segmental and somatic dysfunction of pelvic region: Secondary | ICD-10-CM | POA: Diagnosis not present

## 2018-04-27 DIAGNOSIS — M9903 Segmental and somatic dysfunction of lumbar region: Secondary | ICD-10-CM | POA: Diagnosis not present

## 2018-04-27 DIAGNOSIS — M9902 Segmental and somatic dysfunction of thoracic region: Secondary | ICD-10-CM | POA: Diagnosis not present

## 2018-05-07 DIAGNOSIS — M9901 Segmental and somatic dysfunction of cervical region: Secondary | ICD-10-CM | POA: Diagnosis not present

## 2018-05-07 DIAGNOSIS — M9902 Segmental and somatic dysfunction of thoracic region: Secondary | ICD-10-CM | POA: Diagnosis not present

## 2018-05-07 DIAGNOSIS — M545 Low back pain: Secondary | ICD-10-CM | POA: Diagnosis not present

## 2018-05-07 DIAGNOSIS — M25551 Pain in right hip: Secondary | ICD-10-CM | POA: Diagnosis not present

## 2018-05-07 DIAGNOSIS — M9905 Segmental and somatic dysfunction of pelvic region: Secondary | ICD-10-CM | POA: Diagnosis not present

## 2018-05-07 DIAGNOSIS — M9903 Segmental and somatic dysfunction of lumbar region: Secondary | ICD-10-CM | POA: Diagnosis not present

## 2018-05-17 DIAGNOSIS — H43811 Vitreous degeneration, right eye: Secondary | ICD-10-CM | POA: Diagnosis not present

## 2018-05-19 DIAGNOSIS — M7522 Bicipital tendinitis, left shoulder: Secondary | ICD-10-CM | POA: Diagnosis not present

## 2018-05-19 DIAGNOSIS — M9905 Segmental and somatic dysfunction of pelvic region: Secondary | ICD-10-CM | POA: Diagnosis not present

## 2018-05-19 DIAGNOSIS — M9901 Segmental and somatic dysfunction of cervical region: Secondary | ICD-10-CM | POA: Diagnosis not present

## 2018-05-19 DIAGNOSIS — M9903 Segmental and somatic dysfunction of lumbar region: Secondary | ICD-10-CM | POA: Diagnosis not present

## 2018-05-19 DIAGNOSIS — M9902 Segmental and somatic dysfunction of thoracic region: Secondary | ICD-10-CM | POA: Diagnosis not present

## 2018-05-26 ENCOUNTER — Ambulatory Visit: Payer: BLUE CROSS/BLUE SHIELD | Admitting: Sports Medicine

## 2018-05-26 ENCOUNTER — Ambulatory Visit (INDEPENDENT_AMBULATORY_CARE_PROVIDER_SITE_OTHER): Payer: BLUE CROSS/BLUE SHIELD

## 2018-05-26 ENCOUNTER — Encounter: Payer: Self-pay | Admitting: Sports Medicine

## 2018-05-26 DIAGNOSIS — M7552 Bursitis of left shoulder: Secondary | ICD-10-CM

## 2018-05-26 DIAGNOSIS — M19012 Primary osteoarthritis, left shoulder: Secondary | ICD-10-CM | POA: Diagnosis not present

## 2018-05-26 NOTE — Progress Notes (Signed)
Subjective:    I'm seeing this patient as a consultation for: Dr. Beatrice Lecher  CC: Left shoulder pain  HPI: This is a very pleasant 62 year old female, for the past few weeks she is had pain that she localizes over her left deltoid, worse with abduction of the shoulder, waking her from sleep, nothing radicular, pain is severe, persistent, localized without radiation.  I reviewed the past medical history, family history, social history, surgical history, and allergies today and no changes were needed.  Please see the problem list section below in epic for further details.  Past Medical History: Past Medical History:  Diagnosis Date  . Anxiety   . Crohn disease ([place])    Dr. Laverta [place]  . Depression    on prozac previously  . Hyperlipidemia   . Miscarriage    Past Surgical History: Past Surgical History:  Procedure Laterality Date  . CARDIAC SURGERY     as a child  . EYE SURGERY  10/30/2009   repair of detached retina  . TONSILLECTOMY  1965   Social History: Social History   Socioeconomic History  . Marital status: Married    Spouse name: [name]  . Number of children: 3  . Years of education: Not on file  . Highest education level: Not on file  Occupational History  . 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 Financial resource strain: Not on file  . Food insecurity:    Worry: Not on file    Inability: Not on file  . Transportation needs:    Medical: Not on file    Non-medical: Not on file  Tobacco Use  . Smoking status: Former Smoker    Last attempt to quit: 06/16/1974    Years since quitting: 43.9  . Smokeless tobacco: Never Used  Substance and Sexual Activity  . Alcohol use: Yes    Alcohol/week: 7.0 standard drinks    Types: 7 Glasses of wine per week  . Drug use: No  . Sexual activity: Not on file  Lifestyle  . Physical activity:    Days per week: Not on file    Minutes per session: Not on file  . Stress: Not on file    Relationships  . Social connections:    Talks on phone: Not on file    Gets together: Not on file    Attends religious service: Not on file    Active member of club or organization: Not on file    Attends meetings of clubs or organizations: Not on file    Relationship status: Not on file  Other Topics Concern  . Not on file  Social History Narrative   Lives with husband.Regular exercise-no.   Family History: Family History  Problem Relation Age of Onset  . Alzheimer's disease Mother    Allergies: No Known Allergies Medications: See med rec.  Review of Systems: No headache, visual changes, nausea, vomiting, diarrhea, constipation, dizziness, abdominal pain, skin rash, fevers, chills, night sweats, weight loss, swollen lymph nodes, body aches, joint swelling, muscle aches, chest pain, shortness of breath, mood changes, visual or auditory hallucinations.   Objective:   General: Well Developed, well nourished, and in no acute distress.  Neuro:  Extra-ocular muscles intact, able to move all 4 extremities, sensation grossly intact.  Deep tendon reflexes tested were normal. Psych: Alert and oriented, mood congruent with affect. ENT:  Ears and nose appear unremarkable.  Hearing grossly normal. Neck: Unremarkable overall appearance, trachea midline.  No visible thyroid enlargement. Eyes: Conjunctivae and lids appear unremarkable.  Pupils equal and round. Skin: Warm and dry, no rashes noted.  Cardiovascular: Pulses palpable, no extremity edema. Left shoulder: Inspection reveals no abnormalities, atrophy or asymmetry. Palpation is normal with no tenderness over AC joint or bicipital groove. ROM is full in all planes. Rotator cuff strength normal throughout. Positive Neer and Hawkin's tests, empty can. Speeds and Yergason's tests normal. No labral pathology noted with negative Obrien's, negative crank, negative clunk, and good stability. Normal scapular function observed. No painful  arc and no drop arm sign. No apprehension sign  Procedure: Real-time Ultrasound Guided Injection of left subacromial bursa Device: GE Logiq E  Verbal informed consent obtained.  Time-out conducted.  Noted no overlying erythema, induration, or other signs of local infection.  Skin prepped in a sterile fashion.  Local anesthesia: Topical Ethyl chloride.  With sterile technique and under real time ultrasound guidance: 1 cc Kenalog 40, 1 cc lidocaine, 1 cc bupivacaine injected easily Completed without difficulty  Pain immediately resolved suggesting accurate placement of the medication.  Advised to call if fevers/chills, erythema, induration, drainage, or persistent bleeding.  Images permanently stored and available for review in the ultrasound unit.  Impression: Technically successful ultrasound guided injection.  Impression and Recommendations:   This case required medical decision making of moderate complexity.  Acute shoulder bursitis, left Left subacromial bursitis, waking patient from sleep. Subacromial injection, x-rays, formal physical therapy. ___________________________________________ Gwen Her. [name], M.D., ABFM., CAQSM. Primary Care and Sports Medicine [facility]  Adjunct Professor of [place] [employer] [facility] [employer]

## 2018-05-26 NOTE — Assessment & Plan Note (Signed)
Left subacromial bursitis, waking patient from sleep. Subacromial injection, x-rays, formal physical therapy.

## 2018-06-01 ENCOUNTER — Encounter: Payer: Self-pay | Admitting: Rehabilitative and Restorative Service Providers"

## 2018-06-01 ENCOUNTER — Ambulatory Visit (INDEPENDENT_AMBULATORY_CARE_PROVIDER_SITE_OTHER): Payer: BLUE CROSS/BLUE SHIELD | Admitting: Rehabilitative and Restorative Service Providers"

## 2018-06-01 ENCOUNTER — Other Ambulatory Visit: Payer: Self-pay

## 2018-06-01 DIAGNOSIS — M25512 Pain in left shoulder: Secondary | ICD-10-CM

## 2018-06-01 DIAGNOSIS — G2589 Other specified extrapyramidal and movement disorders: Secondary | ICD-10-CM

## 2018-06-01 DIAGNOSIS — R29898 Other symptoms and signs involving the musculoskeletal system: Secondary | ICD-10-CM

## 2018-06-01 NOTE — Therapy (Signed)
[place], [place], [address] Phone: [phone]   Fax:  [phone]  Physical Therapy Evaluation  Patient Details  Name: Diane Chavez MRN: [id number] Date of Birth: [birth year] Referring Provider (PT): Dr [name]    Encounter Date: 06/01/2018  PT End of Session - 06/01/18 0850    Visit Number  1    Number of Visits  12    Date for PT Re-Evaluation  07/13/18    PT Start Time  0945    PT Stop Time  8182    PT Time Calculation (min)  59 min    Activity Tolerance  Patient tolerated treatment well       Past Medical History:  Diagnosis Date  . Anxiety   . Crohn disease ([place])    Dr. Laverta Baltimore  . Depression    on prozac previously  . Hyperlipidemia   . Miscarriage     Past Surgical History:  Procedure Laterality Date  . CARDIAC SURGERY     as a child  . EYE SURGERY  10/30/2009   repair of detached retina  . TONSILLECTOMY  1965    There were no vitals filed for this visit.   Subjective Assessment - 06/01/18 0850    Subjective  Patient reports gradual onset of Lt shoulder pain over the past 3 months with symptoms increased in the past 3 weeks. She received injection 05/27/18 which helped for a couple of days and symptoms gradually have returned.     Pertinent History  chronic neck pain; colitis; OHS - for defective heart valve at 62 yr old; d&C     Diagnostic tests  xrays     Patient Stated Goals  get rid of the pain and movew shoulder around better - has had shoulder pain in the shoulder blade area for a long time    Currently in Pain?  No/denies   up to 4/10 with movement    Pain Location  Shoulder    Pain Orientation  Left    Pain Descriptors / Indicators  Sharp    Pain Type  Acute pain;Chronic pain    Pain Radiating Towards  up toward neck     Pain Onset  More than a month ago    Pain Frequency  Intermittent    Aggravating Factors   reaching; lifting; pulling; dressing;  sometimes sleeping is affected     Pain Relieving Factors  injection; motrin         [facility] PT Assessment - 06/01/18 0001      Assessment   Medical Diagnosis  Lt shoulder pain and dysfunction    Referring Provider (PT)  Dr [name]     Onset Date/Surgical Date  02/14/18    Hand Dominance  Left    Next MD Visit  07/07/18    Prior Therapy  chiropractic care off and on for years       Precautions   Precautions  None      Balance Screen   Has the patient fallen in the past 6 months  No    Has the patient had a decrease in activity level because of a fear of falling?   No    Is the patient reluctant to leave their home because of a fear of falling?   No      Prior Function   Level of Independence  Independent    Vocation  Full time employment    Vocation Requirements  sitting at a computer - for 20 yrs     Leisure  some household chores; some cooking; phone       Observation/Other Assessments   Focus on Therapeutic Outcomes (FOTO)   46% limitation       Sensation   Additional Comments  WFL's per pt report       Posture/Postural Control   Posture Comments  head forward; shoulders rounded and elevated; head of the humerus anterior in orientatoin; scapulae abducted and rotated along the thoracic wall       AROM   Overall AROM Comments  stiffness with cervical ROM greatest with Lt lateral flexion and rotation     Right Shoulder Extension  50 Degrees    Right Shoulder Flexion  158 Degrees    Right Shoulder ABduction  176 Degrees    Right Shoulder Internal Rotation  35 Degrees    Right Shoulder External Rotation  94 Degrees    Left Shoulder Extension  53 Degrees    Left Shoulder Flexion  140 Degrees   discomfort   Left Shoulder ABduction  155 Degrees   discomfort    Left Shoulder Internal Rotation  30 Degrees    Left Shoulder External Rotation  95 Degrees    Cervical Flexion  54    Cervical Extension  43    Cervical - Right Side Bend  34    Cervical - Left Side Bend  30     Cervical - Right Rotation  66    Cervical - Left Rotation  62      Strength   Overall Strength Comments  WFL's bilat UE's except middle and lower trap 4/5 to 4+/5 bilat       Palpation   Spinal mobility  hypomobile cervical and thoracic spine with CPA mobs     Palpation comment  muscular tightness Lt > Rt pecs; upper trap; leveator; teres; lats; biceps; deltoid                 Objective measurements completed on examination: See above findings.      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, broadcasting/film/video Exercises: Standing   Other Standing Exercises  axial extension 10 sec x 5; scap squeeze 10 sec x 10; L's x 10; W's x 10 with swim noodle       Shoulder Exercises: Stretch   Other Shoulder Stretches  3 way pec stretch 30 sec x 3 reps each position - greatest difficulty with higher position patient reminded to go to stretch not pain       Moist Heat Therapy   Number Minutes Moist Heat  20 Minutes    Moist Heat Location  Shoulder;Cervical   Lt shoulder - thoracic spine      Electrical Stimulation   Electrical Stimulation Location  Lt shoulder girdle     Electrical Stimulation Action  IFC    Electrical Stimulation Parameters  to tolerance    Electrical Stimulation Goals  Pain;Tone             PT Education - 06/01/18 0935    Education Details  HEP POC     Person(s) Educated  Patient    Methods  Explanation;Demonstration;Tactile cues;Verbal cues;Handout    Comprehension  Verbalized understanding;Returned demonstration;Verbal cues required;Tactile cues required  PT Long Term Goals - 06/01/18 1145      PT LONG TERM GOAL #1   Title  Improve posture and alignment with patient to demonstrate improved upright posture with posterior shoulder girdle engaged 07/13/18    Time  6    Period  Weeks    Status  New      PT LONG TERM GOAL #2    Title  Increase AROM Lt shoulder to equal Rt shoulder 07/13/18    Time  6    Period  Weeks    Status  New      PT LONG TERM GOAL #3   Title  Decrease pain with patient to report ability to reach up and behind back with minimal to no pain 07/13/18    Time  6    Period  Weeks    Status  New      PT LONG TERM GOAL #4   Title  Independent in HEP 07/13/18    Time  6    Period  Weeks    Status  New      PT LONG TERM GOAL #5   Title  Improve FOTO to </= 32% limitation 07/13/18    Time  6    Period  Weeks    Status  New             Plan - 06/01/18 1130    Clinical Impression Statement  Adlyn presents with 3-4 month history of Lt shoulder pain with symptoms increasing in the past 3 weeks. She received an injection last week with some help but pain and limited mobility continue. Patient has poor posture and alignment; scapular dyskinesis Lt > Rt with UE movement;  limited Lt shoulder ROM; limited cervical mobility/ROM; muscular rightness to palpation; postural muscle weakness; pain with functional activities and pain at rest. She will benefit from PT to address problems identified.      History and Personal Factors relevant to plan of care:  longstanding shoulder and cervical pain; scapular dyskinesis; occupation contributes to pathology; sedentary lifestyle     Clinical Presentation  Stable    Clinical Decision Making  Low    Rehab Potential  Good    PT Frequency  2x / week    PT Duration  6 weeks    PT Treatment/Interventions  Patient/family education;ADLs/Self Care Home Management;Cryotherapy;Electrical Stimulation;Iontophoresis 11m/ml Dexamethasone;Moist Heat;Ultrasound;Dry needling;Manual techniques;Neuromuscular re-education;Therapeutic activities;Therapeutic exercise    PT Next Visit Plan  review HEP; progress with pec stretch - snow angle - neuromuscular re-education scapular control; posterior shoulder girdle strengthening; manual work vs DN; modalities as indicated      Consulted and Agree with Plan of Care  Patient       Patient will benefit from skilled therapeutic intervention in order to improve the following deficits and impairments:  Postural dysfunction, Improper body mechanics, Pain, Increased fascial restricitons, Increased muscle spasms, Hypermobility, Decreased mobility, Decreased range of motion, Decreased activity tolerance  Visit Diagnosis: Acute pain of left shoulder - Plan: PT plan of care cert/re-cert  Scapular dyskinesis - Plan: PT plan of care cert/re-cert  Other symptoms and signs involving the musculoskeletal system - Plan: PT plan of care cert/re-cert     Problem List Patient Active Problem List   Diagnosis Date Noted  . Acute shoulder bursitis, left 05/26/2018  . Left knee swelling with Baker's cyst 09/30/2017  . Status post cardiac surgery 09/05/2013  . Osteoporosis 10/10/2010  . BENIGN POSITIONAL VERTIGO 04/05/2010  . Regional enteritis ([facility]  07/30/2009  . HEADACHE 07/30/2009  . POSTMENOPAUSAL STATUS 04/25/2009  . ANXIETY 09/20/2007  . Insomnia 09/20/2007    Newell Wafer [name] PT, MPH  06/01/2018, 11:54 AM  [facility] [place], [place], [address] Phone: [phone]   Fax:  [phone]  Name: CORINE SOLORIO MRN: [id number] Date of Birth: [birth year]

## 2018-06-01 NOTE — Patient Instructions (Signed)
Axial Extension (Chin Tuck)    Pull chin in and lengthen back of neck. Hold __5__ seconds while counting out loud. Repeat __10__ times. Do __several__ sessions per day.  Shoulder Blade Squeeze    Rotate shoulders back, then squeeze shoulder blades down and back  Hold 10 sec Repeat __10__ times. Do _several ___ sessions per day.  Upper Back Strength: Lower Trapezius / Rotator Cuff " L's "     Arms in waitress pose, palms up. Press hands back and slide shoulder blades down. Hold for __5__ seconds. Repeat _10___ times. 1-2 times per day.    Scapular Retraction: Elbow Flexion (Standing)  "W's"     With elbows bent to 90, pinch shoulder blades together and rotate arms out, keeping elbows bent. Repeat __10__ times per set. Do __1-2__ sets per session. Do _several ___ sessions per day.  Scapula Adduction With Pectoralis Stretch: Low - Standing   Shoulders at 45 hands even with shoulders, keeping weight through legs, shift weight forward until you feel pull or stretch through the front of your chest. Hold _30__ seconds. Do _3__ times, _2-4__ times per day.   Scapula Adduction With Pectoralis Stretch: Mid-Range - Standing   Shoulders at 90 elbows even with shoulders, keeping weight through legs, shift weight forward until you feel pull or strength through the front of your chest. Hold __30_ seconds. Do _3__ times, __2-4_ times per day.   Scapula Adduction With Pectoralis Stretch: High - Standing   Shoulders at 120 hands up high on the doorway, keeping weight on feet, shift weight forward until you feel pull or stretch through the front of your chest. Hold _30__ seconds. Do _3__ times, _2-3__ times per day.    [facility] [place], [place] [address]  [phone] (office) [phone] (fax)

## 2018-06-04 ENCOUNTER — Encounter: Payer: BLUE CROSS/BLUE SHIELD | Admitting: Rehabilitative and Restorative Service Providers"

## 2018-06-07 ENCOUNTER — Ambulatory Visit (INDEPENDENT_AMBULATORY_CARE_PROVIDER_SITE_OTHER): Payer: BLUE CROSS/BLUE SHIELD | Admitting: Rehabilitative and Restorative Service Providers"

## 2018-06-07 DIAGNOSIS — G2589 Other specified extrapyramidal and movement disorders: Secondary | ICD-10-CM | POA: Diagnosis not present

## 2018-06-07 DIAGNOSIS — R29898 Other symptoms and signs involving the musculoskeletal system: Secondary | ICD-10-CM | POA: Diagnosis not present

## 2018-06-07 DIAGNOSIS — M25512 Pain in left shoulder: Secondary | ICD-10-CM

## 2018-06-07 NOTE — Therapy (Signed)
[place], [place], [address] Phone: [phone]   Fax:  [phone]  Physical Therapy Treatment  Patient Details  Name: Diane Chavez MRN: [id number] Date of Birth: 11/06/1955 Referring Provider (PT): Dr [name]    Encounter Date: 06/07/2018  PT End of Session - 06/07/18 0808    Visit Number  2    Number of Visits  12    Date for PT Re-Evaluation  07/13/18    PT Start Time  0803    PT Stop Time  0848    PT Time Calculation (min)  45 min    Activity Tolerance  Patient tolerated treatment well       Past Medical History:  Diagnosis Date  . Anxiety   . Crohn disease ([place])    Dr. Laverta Baltimore  . Depression    on prozac previously  . Hyperlipidemia   . Miscarriage     Past Surgical History:  Procedure Laterality Date  . CARDIAC SURGERY     as a child  . EYE SURGERY  10/30/2009   repair of detached retina  . TONSILLECTOMY  1965    There were no vitals filed for this visit.  Subjective Assessment - 06/07/18 0808    Subjective  Shoulder has not been hurting. She is working on her exercises at home.     Currently in Pain?  No/denies    Pain Location  Shoulder    Pain Orientation  Left                       [facility] Adult PT Treatment/Exercise - 06/07/18 0001      Shoulder Exercises: Supine   Other Supine Exercises  prolonged snow angel 2-3 min arms at ~ 90 deg       Shoulder Exercises: Seated   Other Seated Exercises  working on scapular depression sitting pulling shoulder blade down and back - trial of pressing into ball but pt elevated shoulder - working without pressing down       Shoulder Exercises: Standing   Extension  Strengthening;Both;15 reps;Theraband    Theraband Level (Shoulder Extension)  Level 2 (Red)    Row  Strengthening;Both;15 reps;Theraband    Theraband Level (Shoulder Row)  Level 2 (Red)    Retraction  Strengthening;Both;15 reps;Theraband     Theraband Level (Shoulder Retraction)  Level 1 (Yellow)    Other Standing Exercises  axial extension 10 sec x 5; scap squeeze 10 sec x 10; L's x 10; W's x 10 with swim noodle       Shoulder Exercises: Stretch   Other Shoulder Stretches  3 way pec stretch 30 sec x 3 reps each position - greatest difficulty with higher position patient reminded to go to stretch not pain       Modalities   Modalities  --   pt declined      Manual Therapy   Manual therapy comments  pt supine hooklying     Joint Mobilization  GH/scap mobs    Soft tissue mobilization  deep tissue work through the pecs/teres/upper trap Lt     Passive ROM  Stretching into shoulder flexion with stabilization of scapula to disassociate humerus and scapula              PT Education - 06/07/18 0833    Education Details  HEP     Person(s) Educated  Patient    Methods  Explanation;Demonstration;Tactile cues;Verbal cues;Handout  Comprehension  Verbalized understanding;Returned demonstration;Verbal cues required;Tactile cues required          PT Long Term Goals - 06/01/18 1145      PT LONG TERM GOAL #1   Title  Improve posture and alignment with patient to demonstrate improved upright posture with posterior shoulder girdle engaged 07/13/18    Time  6    Period  Weeks    Status  New      PT LONG TERM GOAL #2   Title  Increase AROM Lt shoulder to equal Rt shoulder 07/13/18    Time  6    Period  Weeks    Status  New      PT LONG TERM GOAL #3   Title  Decrease pain with patient to report ability to reach up and behind back with minimal to no pain 07/13/18    Time  6    Period  Weeks    Status  New      PT LONG TERM GOAL #4   Title  Independent in HEP 07/13/18    Time  6    Period  Weeks    Status  New      PT LONG TERM GOAL #5   Title  Improve FOTO to </= 32% limitation 07/13/18    Time  6    Period  Weeks    Status  New            Plan - 06/07/18 9518    Clinical Impression Statement  Positive  response to initial treatment and HEP. Patient reports no pain in shoulder. Progressing with scapular stabilization. Needs work on scapular stability/disassociation of [place] ans scapulo humeral movement. Very tight teres/pecs/trap. Poor quality shoulder movement - with scapular dyskinesis.    Rehab Potential  Good    PT Frequency  2x / week    PT Duration  6 weeks    PT Treatment/Interventions  Patient/family education;ADLs/Self Care Home Management;Cryotherapy;Electrical Stimulation;Iontophoresis 32m/ml Dexamethasone;Moist Heat;Ultrasound;Dry needling;Manual techniques;Neuromuscular re-education;Therapeutic activities;Therapeutic exercise    PT Next Visit Plan  review HEP; progress with pec stretch - snow angle - neuromuscular re-education scapular control; posterior shoulder girdle strengthening; manual work; modalities as indicated     COncologistwith Plan of Care  Patient       Patient will benefit from skilled therapeutic intervention in order to improve the following deficits and impairments:  Postural dysfunction, Improper body mechanics, Pain, Increased fascial restricitons, Increased muscle spasms, Hypermobility, Decreased mobility, Decreased range of motion, Decreased activity tolerance  Visit Diagnosis: Acute pain of left shoulder  Scapular dyskinesis  Other symptoms and signs involving the musculoskeletal system     Problem List Patient Active Problem List   Diagnosis Date Noted  . Acute shoulder bursitis, left 05/26/2018  . Left knee swelling with Baker's cyst 09/30/2017  . Status post cardiac surgery 09/05/2013  . Osteoporosis 10/10/2010  . BENIGN POSITIONAL VERTIGO 04/05/2010  . [place]  . HEADACHE 07/30/2009  . POSTMENOPAUSAL STATUS 04/25/2009  . ANXIETY 09/20/2007  . Insomnia 09/20/2007    Rosalina Dingwall PNilda SimmerPT, MPH  06/07/2018, 8:57 AM  [facility] [address]Phone: [phone]  Fax:  [phone] Name: Diane PLASSMRN: [id number] of Birth: 3June 06, 1957

## 2018-06-07 NOTE — Patient Instructions (Addendum)
Resisted External Rotation: in Neutral - Bilateral   PALMS UP Sit or stand, tubing in both hands, elbows at sides, bent to 90, forearms forward. Pinch shoulder blades together and rotate forearms out. Keep elbows at sides. Repeat __10__ times per set. Do _2-3___ sets per session. Do _2-3___ sessions per day.   Low Row: Standing   Face anchor, feet shoulder width apart. Palms up, pull arms back, squeezing shoulder blades down and back . Repeat 10__ times per set. Do 2-3__ sets per session. Do 2-3__ sessions per week. Anchor Height: Waist   Strengthening: Resisted Extension   Hold tubing in right hand, arm forward. Pull arm back, elbow straight. Repeat _10___ times per set. Do 2-3____ sets per session. Do 2-3____ sessions per day.  Sitting or standing pull shoulder blades to back pockets   SUPINE Tips A    Being in the supine position means to be lying on the back. Lying on the back is the position of least compression on the bones and discs of the spine, and helps to re-align the natural curves of the back. Working toward 2-5 min - bend elbows to release stretch and then return to straight position

## 2018-06-10 ENCOUNTER — Encounter: Payer: Self-pay | Admitting: Rehabilitative and Restorative Service Providers"

## 2018-06-10 ENCOUNTER — Ambulatory Visit (INDEPENDENT_AMBULATORY_CARE_PROVIDER_SITE_OTHER): Payer: BLUE CROSS/BLUE SHIELD | Admitting: Rehabilitative and Restorative Service Providers"

## 2018-06-10 DIAGNOSIS — G2589 Other specified extrapyramidal and movement disorders: Secondary | ICD-10-CM | POA: Diagnosis not present

## 2018-06-10 DIAGNOSIS — M25512 Pain in left shoulder: Secondary | ICD-10-CM | POA: Diagnosis not present

## 2018-06-10 DIAGNOSIS — R29898 Other symptoms and signs involving the musculoskeletal system: Secondary | ICD-10-CM

## 2018-06-10 NOTE — Therapy (Signed)
[place], [place], [address] Phone: [phone]   Fax:  [phone]  Physical Therapy Treatment  Patient Details  Name: Diane Chavez MRN: [id number] Date of Birth: 1956-04-26 Referring Provider (PT): Dr [name]    Encounter Date: 06/10/2018  PT End of Session - 06/10/18 0929    Visit Number  3    Number of Visits  12    Date for PT Re-Evaluation  07/13/18    PT Start Time  0929    PT Stop Time  1015    PT Time Calculation (min)  46 min    Activity Tolerance  Patient tolerated treatment well       Past Medical History:  Diagnosis Date  . Anxiety   . Crohn disease ([place])    Dr. Laverta Baltimore  . Depression    on prozac previously  . Hyperlipidemia   . Miscarriage     Past Surgical History:  Procedure Laterality Date  . CARDIAC SURGERY     as a child  . EYE SURGERY  10/30/2009   repair of detached retina  . TONSILLECTOMY  1965    There were no vitals filed for this visit.  Subjective Assessment - 06/10/18 0930    Subjective  'Patient reports that her shoulder is feeling 'pretty good'. She hsa a cough that she does not seem to be able to get rid of. She had no soreness following last visit. On vacation the first week of January.     Currently in Pain?  No/denies         [facility] PT Assessment - 06/10/18 0001      Assessment   Medical Diagnosis  Lt shoulder pain and dysfunction    Referring Provider (PT)  Dr [name]     Onset Date/Surgical Date  02/14/18    Hand Dominance  Left    Next MD Visit  07/07/18    Prior Therapy  chiropractic care off and on for years       AROM   Left Shoulder Extension  53 Degrees    Left Shoulder Flexion  157 Degrees    Left Shoulder ABduction  161 Degrees    Left Shoulder Internal Rotation  34 Degrees    Left Shoulder External Rotation  95 Degrees                   [facility] Adult PT Treatment/Exercise - 06/10/18 0001      Shoulder  Exercises: Supine   Other Supine Exercises  prolonged snow angel 2-3 min arms at ~ 90 deg added 5 inch noodle along spine     Other Supine Exercises  trunk rotation toward each side - shoulder ~ 90 deg abduction  20-30 sec hold x 3 reps each side       Shoulder Exercises: Seated   Other Seated Exercises  working on scapular depression sitting pulling shoulder blade down and back - trial of pressing into ball but pt elevated shoulder - working without pressing down       Shoulder Exercises: Standing   Extension  Strengthening;Both;15 reps;Theraband    Theraband Level (Shoulder Extension)  Level 2 (Red)    Row  Strengthening;Both;15 reps;Theraband    Theraband Level (Shoulder Row)  Level 2 (Red)    Row Limitations  added bow and arrow step back red TB x 10 x 2 sets VC required for technique     Retraction  Strengthening;Both;15 reps;Theraband  Theraband Level (Shoulder Retraction)  Level 1 (Yellow)    Other Standing Exercises  axial extension 10 sec x 5; scap squeeze 10 sec x 10; L's x 10; W's x 10 with swim noodle     Other Standing Exercises  scapular control back to wall shoulders down and back shoulder ahd ~60 deg elbow ~ 90 deg rolling ~ 4" ball on dorwum of hand ~20-30 reps each UE       Shoulder Exercises: Stretch   Other Shoulder Stretches  3 way pec stretch 30 sec x 3 reps each position - greatest difficulty with higher position patient reminded to go to stretch not pain    added slight turn to Rt to increase stretch on Lt last rep      Modalities   Modalities  --   pt declined      Manual Therapy   Manual therapy comments  pt supine hooklying     Joint Mobilization  GH/scap mobs    Soft tissue mobilization  deep tissue work through the pecs/teres/upper trap Lt     Passive ROM  Stretching into shoulder flexion with stabilization of scapula to disassociate humerus and scapula; IR/ER/ abd              PT Education - 06/10/18 0956    Education Details  HEP      Person(s) Educated  Patient    Methods  Explanation;Demonstration;Tactile cues;Verbal cues;Handout    Comprehension  Verbalized understanding;Returned demonstration;Verbal cues required;Tactile cues required          PT Long Term Goals - 06/01/18 1145      PT LONG TERM GOAL #1   Title  Improve posture and alignment with patient to demonstrate improved upright posture with posterior shoulder girdle engaged 07/13/18    Time  6    Period  Weeks    Status  New      PT LONG TERM GOAL #2   Title  Increase AROM Lt shoulder to equal Rt shoulder 07/13/18    Time  6    Period  Weeks    Status  New      PT LONG TERM GOAL #3   Title  Decrease pain with patient to report ability to reach up and behind back with minimal to no pain 07/13/18    Time  6    Period  Weeks    Status  New      PT LONG TERM GOAL #4   Title  Independent in HEP 07/13/18    Time  6    Period  Weeks    Status  New      PT LONG TERM GOAL #5   Title  Improve FOTO to </= 32% limitation 07/13/18    Time  6    Period  Weeks    Status  New            Plan - 06/10/18 0929    Clinical Impression Statement  Progressing well with shoulder rehab - little to no pain. Working on her exercises at home with improved scapular control; increased shoulder ROM with no pain or discomfort; improving tightness through pecs; teres; upper trap; leveator. Will benefit from continued manual work Lt shoulder girdle with scapular stabilization with attention to position of scapula on thoracic wall. Progressing with scapular exercises gradually. Will benefit from continued PT to accomplish goals and prevent recurrent problems.     Rehab Potential  Good    PT Frequency  2x / week    PT Duration  6 weeks    PT Treatment/Interventions  Patient/family education;ADLs/Self Care Home Management;Cryotherapy;Electrical Stimulation;Iontophoresis 70m/ml Dexamethasone;Moist Heat;Ultrasound;Dry needling;Manual techniques;Neuromuscular  re-education;Therapeutic activities;Therapeutic exercise    PT Next Visit Plan  review HEP; progress with pec stretch - snow angle - neuromuscular re-education scapular control; posterior shoulder girdle strengthening; manual work; modalities as indicated     COncologistwith Plan of Care  Patient       Patient will benefit from skilled therapeutic intervention in order to improve the following deficits and impairments:  Postural dysfunction, Improper body mechanics, Pain, Increased fascial restricitons, Increased muscle spasms, Hypermobility, Decreased mobility, Decreased range of motion, Decreased activity tolerance  Visit Diagnosis: Acute pain of left shoulder  Scapular dyskinesis  Other symptoms and signs involving the musculoskeletal system     Problem List Patient Active Problem List   Diagnosis Date Noted  . Acute shoulder bursitis, left 05/26/2018  . Left knee swelling with Baker's cyst 09/30/2017  . Status post cardiac surgery 09/05/2013  . Osteoporosis 10/10/2010  . BENIGN POSITIONAL VERTIGO 04/05/2010  . [place] enteritis ([place] 07/30/2009  . HEADACHE 07/30/2009  . POSTMENOPAUSAL STATUS 04/25/2009  . ANXIETY 09/20/2007  . Insomnia 09/20/2007    Saundra Gin PNilda SimmerPT, MPH  06/10/2018, 10:23 AM  [facility] [address]Phone: [phone]  Fax:  [phone] Name: Diane Chavez: [id number] of Birth: [birth year]

## 2018-06-10 NOTE — Patient Instructions (Signed)
Ball on wall Back to wall - shoulder blades down and back Arm to side with elbow bent; 4 in ball at the back of hand  Small circles CW/CCW keeping shoulder blades down and back  20-30 reps - 1-2 sets - 1/2 times a day   theraband - bow and arrow Stepping back as you pull the band back (shooting a bow)  Elbow up - shoulder blades down and back  10 reps x 2 sets - 1-2 times/day   Can add swim noodle along spine when lying on back  2-5 min - no pain just a stretch across the front of the chest    Knee Roll    Lying on back, with knees bent and feet flat on floor, arms outstretched to sides, slowly roll both knees to side, hold 20-30 seconds. Back to starting position, hold 5 seconds. Then to opposite side, hold 20-30 seconds. Return to starting position. Keep shoulders and arms in contact with floor.   Marland Kitchen

## 2018-06-13 ENCOUNTER — Emergency Department (INDEPENDENT_AMBULATORY_CARE_PROVIDER_SITE_OTHER)
Admission: EM | Admit: 2018-06-13 | Discharge: 2018-06-13 | Disposition: A | Discharge status: Home / Self Care | Payer: BLUE CROSS/BLUE SHIELD | Source: Home / Self Care | Attending: Family Medicine | Admitting: Family Medicine

## 2018-06-13 ENCOUNTER — Emergency Department (INDEPENDENT_AMBULATORY_CARE_PROVIDER_SITE_OTHER): Payer: BLUE CROSS/BLUE SHIELD

## 2018-06-13 DIAGNOSIS — J40 Bronchitis, not specified as acute or chronic: Secondary | ICD-10-CM | POA: Diagnosis not present

## 2018-06-13 DIAGNOSIS — R05 Cough: Secondary | ICD-10-CM

## 2018-06-13 DIAGNOSIS — R079 Chest pain, unspecified: Secondary | ICD-10-CM

## 2018-06-13 MED ORDER — AZITHROMYCIN 250 MG PO TABS: ORAL_TABLET | ORAL | 0 refills | Status: DC

## 2018-06-13 MED ORDER — PREDNISONE 20 MG PO TABS: ORAL_TABLET | ORAL | 0 refills | Status: DC

## 2018-06-13 NOTE — Discharge Instructions (Addendum)
Take plain guaifenesin (121m extended release tabs such as Mucinex) twice daily, with plenty of water, for cough and congestion.  May add Pseudoephedrine (340m one or two every 4 to 6 hours) for sinus congestion.  Get adequate rest.   May use Afrin nasal spray (or generic oxymetazoline) each morning for about 5 days and then discontinue.  Also recommend using saline nasal spray several times daily and saline nasal irrigation (AYR is a common brand).   Try warm salt water gargles for sore throat.  Stop all antihistamines for now, and other non-prescription cough/cold preparations. May continue Delsym Cough Suppressant at bedtime for nighttime cough.

## 2018-06-13 NOTE — ED Triage Notes (Signed)
Diane Chavez complains of a cough for 2 days. She is taking Delsym at night. She did wake on Thursday to no voice, which has resolved. She does have shortness of breath today.

## 2018-06-13 NOTE — ED Provider Notes (Signed)
Diane [facility]    CSN: [id number] Arrival date & time: 06/13/18  1401     History   Chief Complaint Chief Complaint  Patient presents with  . Cough    HPI Diane Chavez is a 62 y.o. female.   Patient reports that she developed hoarseness, scratchy throat, and non-productive cough about 10 days ago.  Several days ago the cough became productive and she became more fatigued.  She developed sinus congestion two days ago, and has felt shortness of breath and tightness in her anterior chest today.  No fevers, chills, and sweats. Review of [facility] chest X-ray report from 09/09/10 revealed finding of mild hyperinflation.  The history is provided by the patient.    Past Medical History:  Diagnosis Date  . Anxiety   . Crohn disease ([place])    Dr. Laverta Baltimore  . Depression    on prozac previously  . Hyperlipidemia   . Miscarriage     Patient Active Problem List   Diagnosis Date Noted  . Acute shoulder bursitis, left 05/26/2018  . Left knee swelling with Baker's cyst 09/30/2017  . Status post cardiac surgery 09/05/2013  . Osteoporosis 10/10/2010  . BENIGN POSITIONAL VERTIGO 04/05/2010  . 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 HEADACHE 07/30/2009  . POSTMENOPAUSAL STATUS 04/25/2009  . ANXIETY 09/20/2007  . Insomnia 09/20/2007    Past Surgical History:  Procedure Laterality Date  . CARDIAC SURGERY     as a child  . EYE SURGERY  10/30/2009   repair of detached retina  . TONSILLECTOMY  1965    OB History   No obstetric history on file.      Home Medications    Prior to Admission medications   Medication Sig Start Date End Date Taking? Authorizing Provider  Calcium Carbonate-Vit D-Min (CALCIUM 1200 PO) Take by mouth.     Yes [provider]  mesalamine (LIALDA) 1.2 g EC tablet Take by mouth daily with breakfast.   Yes [provider]  VITAMIN K PO Take by mouth.   Yes [provider]  azithromycin (ZITHROMAX Z-PAK) 250 MG tablet Take  2 tabs today; then begin one tab once daily for 4 more days. 06/13/18   Kandra Nicolas, MD  B Complex-Folic Acid (B COMPLEX PLUS PO) Take 1 tablet by mouth daily.      [provider]  BORON PO Take by mouth.    [provider]  cholecalciferol (VITAMIN D) 1000 units tablet Take 1,000 Units by mouth daily.    [provider]  LUTEIN PO Take 1 tablet by mouth daily.    [provider]  meloxicam (MOBIC) 15 MG tablet One tab PO qAM with breakfast for 2 weeks, then daily prn pain. 09/30/17   Silverio Decamp, MD  Omega-3 Fatty Acids (FISH OIL) 1200 MG CAPS Take 2 capsules by mouth daily.      [provider]  predniSONE (DELTASONE) 20 MG tablet Take one tab by mouth twice daily for 4 days, then one daily. Take with food. 06/13/18   Kandra Nicolas, MD    Family History Family History  Problem Relation Age of Onset  . Alzheimer's disease Mother     Social History Social History   Tobacco Use  . Smoking status: Former Smoker    Last attempt to quit: 06/16/1974    Years since quitting: 44.0  . Smokeless tobacco: Never Used  Substance Use Topics  . Alcohol use: Yes  Alcohol/week: 7.0 standard drinks    Types: 7 Glasses of wine per week  . Drug use: No     Allergies   Patient has no known allergies.   Review of Systems Review of Systems + sore throat + hoarse + cough No pleuritic pain, but feels tight in anterior chest No wheezing + nasal congestion + post-nasal drainage No sinus pain/pressure No itchy/red eyes No earache No hemoptysis + SOB No fever/chills No nausea No vomiting No abdominal pain No diarrhea No urinary symptoms No skin rash + fatigue No myalgias + headache Used OTC meds without relief   Physical Exam Triage Vital Signs ED Triage Vitals  Enc Vitals Group     BP 06/13/18 1457 103/68     Pulse Rate 06/13/18 1457 70     Resp 06/13/18 1457 17     Temp 06/13/18 1457 98.1 F (36.7 C)     Temp  Source 06/13/18 1457 Oral     SpO2 06/13/18 1457 100 %     Weight 06/13/18 1459 113 lb (51.3 kg)     Height 06/13/18 1459 5' 9"  (1.753 m)     Head Circumference --      Peak Flow --      Pain Score 06/13/18 1459 0     Pain Loc --      Pain Edu? --      Excl. in [place]? --    No data found.  Updated Vital Signs BP 103/68 (BP Location: Right Arm)   Pulse 70   Temp 98.1 F (36.7 C) (Oral)   Resp 17   Ht 5' 9"  (1.753 m)   Wt 51.3 kg   LMP 06/11/2000   SpO2 100%   BMI 16.69 kg/m   Visual Acuity Right Eye Distance:   Left Eye Distance:   Bilateral Distance:    Right Eye Near:   Left Eye Near:    Bilateral Near:     Physical Exam Nursing notes and Vital Signs reviewed. Appearance:  Patient appears stated age, and in no acute distress Eyes:  Pupils are equal, round, and reactive to light and accomodation.  Extraocular movement is intact.  Conjunctivae are not inflamed  Ears:  Canals normal.  Tympanic membranes normal.  Nose:  Mildly congested turbinates.  No sinus tenderness.  Pharynx:  Normal Neck:  Supple.  Enlarged but nontender posterior/lateral nodes are palpated bilaterally. Lungs:  Faint wheezes right anterior chest.  Breath sounds are equal.  Moving air well. Heart:  Regular rate and rhythm without murmurs, rubs, or gallops.  Abdomen:  Nontender without masses or hepatosplenomegaly.  Bowel sounds are present.  No CVA or flank tenderness.  Extremities:  No edema.  Skin:  No rash present.    UC Treatments / Results  Labs (all labs ordered are listed, but only abnormal results are displayed) Labs Reviewed - No data to display  EKG None  Radiology Dg Chest 2 View  Result Date: 06/13/2018 CLINICAL DATA:  Progressively worsening cough over the past approximate 10 days, with acute onset of chest pain and tightness that began last night. EXAM: CHEST - 2 VIEW COMPARISON:  None. FINDINGS: Cardiomediastinal silhouette unremarkable. Marked hyperinflation. Mildly prominent  bronchovascular markings diffusely. Biapical pleuroparenchymal scarring. Lungs otherwise clear. Pulmonary vascularity normal. No pleural effusions. [place] thoracic levoscoliosis and compensatory thoracolumbar dextroscoliosis. IMPRESSION: Marked hyperinflation indicating COPD and/or asthma. No acute cardiopulmonary disease. Electronically Signed   By: Evangeline Dakin M.D.   On: 06/13/2018 16:12  Procedures Procedures (including critical care time)  Medications Ordered in UC Medications - No data to display  Initial Impression / Assessment and Plan / UC Course  I have reviewed the triage vital signs and the nursing notes.  Pertinent labs & imaging results that were available during my care of the patient were reviewed by me and considered in my medical decision making (see chart for details).    Begin empiric Z-pak and prednisone burst/taper. Today's chest x-ray shows significant hyperinflation that appears to have progressed since her [facility] chest X-ray done 09/09/10. Followup with Family Doctor if not improved in 7 to 10 days.   Final Clinical Impressions(s) / UC Diagnoses   Final diagnoses:  Bronchitis     Discharge Instructions     Take plain guaifenesin (1275m extended release tabs such as Mucinex) twice daily, with plenty of water, for cough and congestion.  May add Pseudoephedrine (342m one or two every 4 to 6 hours) for sinus congestion.  Get adequate rest.   May use Afrin nasal spray (or generic oxymetazoline) each morning for about 5 days and then discontinue.  Also recommend using saline nasal spray several times daily and saline nasal irrigation (AYR is a common brand).   Try warm salt water gargles for sore throat.  Stop all antihistamines for now, and other non-prescription cough/cold preparations. May continue Delsym Cough Suppressant at bedtime for nighttime cough.       ED Prescriptions    Medication Sig Dispense Auth. Provider   azithromycin (ZITHROMAX  Z-PAK) 250 MG tablet Take 2 tabs today; then begin one tab once daily for 4 more days. 6 tablet BeKandra NicolasMD   predniSONE (DELTASONE) 20 MG tablet Take one tab by mouth twice daily for 4 days, then one daily. Take with food. 12 tablet BeKandra NicolasMD        BeKandra NicolasMD 06/17/18 [phone]

## 2018-06-14 ENCOUNTER — Encounter: Payer: Self-pay | Admitting: Physical Therapy

## 2018-06-14 ENCOUNTER — Ambulatory Visit (INDEPENDENT_AMBULATORY_CARE_PROVIDER_SITE_OTHER): Payer: BLUE CROSS/BLUE SHIELD | Admitting: Physical Therapy

## 2018-06-14 DIAGNOSIS — R29898 Other symptoms and signs involving the musculoskeletal system: Secondary | ICD-10-CM

## 2018-06-14 DIAGNOSIS — G2589 Other specified extrapyramidal and movement disorders: Secondary | ICD-10-CM | POA: Diagnosis not present

## 2018-06-14 DIAGNOSIS — M25512 Pain in left shoulder: Secondary | ICD-10-CM | POA: Diagnosis not present

## 2018-06-14 NOTE — Therapy (Signed)
[place], [place], [address] Phone: [phone]   Fax:  [phone]  Physical Therapy Treatment  Patient Details  Name: Diane Chavez MRN: [id number] Date of Birth: Jun 16, 1956 Referring Provider (PT): Dr [name]    Encounter Date: 06/14/2018  PT End of Session - 06/14/18 0717    Visit Number  4    Number of Visits  12    Date for PT Re-Evaluation  07/13/18    PT Start Time  6761    PT Stop Time  0758    PT Time Calculation (min)  41 min    Activity Tolerance  Patient tolerated treatment well;No increased pain    Behavior During Therapy  WFL for tasks assessed/performed       Past Medical History:  Diagnosis Date  . Anxiety   . Crohn disease ([place])    Dr. Laverta Baltimore  . Depression    on prozac previously  . Hyperlipidemia   . Miscarriage     Past Surgical History:  Procedure Laterality Date  . CARDIAC SURGERY     as a child  . EYE SURGERY  10/30/2009   repair of detached retina  . TONSILLECTOMY  1965    There were no vitals filed for this visit.  Subjective Assessment - 06/14/18 0718    Subjective  Pt reports she had some pain in her Lt shoulder with vacuuming this past week, but other than that things are going well.  She is interested in starting exercise program with husband at [employer] upon her return from vacation in January.     Patient Stated Goals  get rid of the pain and movew shoulder around better - has had shoulder pain in the shoulder blade area for a long time    Currently in Pain?  No/denies    Pain Score  0-No pain         [facility] PT Assessment - 06/14/18 0001      Assessment   Medical Diagnosis  Lt shoulder pain and dysfunction    Referring Provider (PT)  Dr [name]     Onset Date/Surgical Date  02/14/18    Hand Dominance  Left    Next MD Visit  07/07/18    Prior Therapy  chiropractic care off and on for years        [facility] Adult PT  Treatment/Exercise - 06/14/18 0001      Self-Care   Self-Care  Other Self-Care Comments    Other Self-Care Comments   Pt educated on self massage with ball to Lt shoulder girdle with cues; pt returned demo and verbalized understanding      Exercises   Exercises  Shoulder      Shoulder Exercises: Standing   Row  Strengthening;Both;10 reps;Theraband    Theraband Level (Shoulder Row)  Level 3 (Green)    Row Limitations  bow and arrow with step back x 10 with green band;     Retraction  Strengthening;Both;15 reps;Theraband    Theraband Level (Shoulder Retraction)  Level 2 (Red)    Diagonals  Right;Left;10 reps;Theraband    Theraband Level (Shoulder Diagonals)  Level 1 (Yellow)      Shoulder Exercises: ROM/Strengthening   UBE (Upper Arm Bike)  L1: 1.5 min forward, .5 min backward, standing.       Shoulder Exercises: Stretch   Other Shoulder Stretches  3 position doorway stretch x 30 sec x 2 reps each position    Other  Shoulder Stretches  thoracic ext over back of chair x 3 reps of 3 breaths.       Modalities   Modalities  --   pt declined      Manual Therapy   Manual Therapy  Soft tissue mobilization;Myofascial release    Soft tissue mobilization  IASTM to Lt pec/ant shoulder and posterior shoulder to decrease fascial restrictions and improve ROM; TPR to Lt infraspinatus with contract/relax.  Pin and stretch to Lt teres/lat.     Myofascial Release  Lt pec release.                  PT Long Term Goals - 06/01/18 1145      PT LONG TERM GOAL #1   Title  Improve posture and alignment with patient to demonstrate improved upright posture with posterior shoulder girdle engaged 07/13/18    Time  6    Period  Weeks    Status  New      PT LONG TERM GOAL #2   Title  Increase AROM Lt shoulder to equal Rt shoulder 07/13/18    Time  6    Period  Weeks    Status  New      PT LONG TERM GOAL #3   Title  Decrease pain with patient to report ability to reach up and behind back with  minimal to no pain 07/13/18    Time  6    Period  Weeks    Status  New      PT LONG TERM GOAL #4   Title  Independent in HEP 07/13/18    Time  6    Period  Weeks    Status  New      PT LONG TERM GOAL #5   Title  Improve FOTO to </= 32% limitation 07/13/18    Time  6    Period  Weeks    Status  New            Plan - 06/14/18 1341    Clinical Impression Statement  Pt tolerated all exercises well, without increase in shoulder symptoms.  Pt continues to be point tender in Lt pec and teres minor/major; pt educated on self massage to address tissue tightness.  Progressing towards goals.     Rehab Potential  Good    PT Frequency  2x / week    PT Duration  6 weeks    PT Treatment/Interventions  Patient/family education;ADLs/Self Care Home Management;Cryotherapy;Electrical Stimulation;Iontophoresis 76m/ml Dexamethasone;Moist Heat;Ultrasound;Dry needling;Manual techniques;Neuromuscular re-education;Therapeutic activities;Therapeutic exercise    PT Next Visit Plan  continue postural strengthening with good scapular control.  manual work and modalities as indicated.     Consulted and Agree with Plan of Care  Patient       Patient will benefit from skilled therapeutic intervention in order to improve the following deficits and impairments:  Postural dysfunction, Improper body mechanics, Pain, Increased fascial restricitons, Increased muscle spasms, Hypermobility, Decreased mobility, Decreased range of motion, Decreased activity tolerance  Visit Diagnosis: Acute pain of left shoulder  Scapular dyskinesis  Other symptoms and signs involving the musculoskeletal system     Problem List Patient Active Problem List   Diagnosis Date Noted  . Acute shoulder bursitis, left 05/26/2018  . Left knee swelling with Baker's cyst 09/30/2017  . Status post cardiac surgery 09/05/2013  . Osteoporosis 10/10/2010  . BENIGN POSITIONAL VERTIGO 04/05/2010  . 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  HEADACHE 07/30/2009  . POSTMENOPAUSAL STATUS 04/25/2009  .  ANXIETY 09/20/2007  . Insomnia 09/20/2007   Kerin Perna, PTA 06/14/18 1:44 PM  [facility] [place], [place], [address] Phone: [phone]   Fax:  [phone]  Name: Diane Chavez MRN: [id number] Date of Birth: 23-Apr-1956

## 2018-06-18 ENCOUNTER — Encounter: Payer: BLUE CROSS/BLUE SHIELD | Admitting: Physical Therapy

## 2018-06-30 ENCOUNTER — Ambulatory Visit (INDEPENDENT_AMBULATORY_CARE_PROVIDER_SITE_OTHER): Payer: BLUE CROSS/BLUE SHIELD | Admitting: Physical Therapy

## 2018-06-30 ENCOUNTER — Encounter: Payer: Self-pay | Admitting: Physical Therapy

## 2018-06-30 DIAGNOSIS — R29898 Other symptoms and signs involving the musculoskeletal system: Secondary | ICD-10-CM

## 2018-06-30 DIAGNOSIS — G2589 Other specified extrapyramidal and movement disorders: Secondary | ICD-10-CM | POA: Diagnosis not present

## 2018-06-30 DIAGNOSIS — M25512 Pain in left shoulder: Secondary | ICD-10-CM | POA: Diagnosis not present

## 2018-06-30 NOTE — Therapy (Signed)
[place], [place], [address] Phone: [phone]   Fax:  [phone]  Physical Therapy Treatment  Patient Details  Name: Diane Chavez MRN: [id number] Date of Birth: 11/21/1955 Referring Provider (PT): Dr [name]    Encounter Date: 06/30/2018  PT End of Session - 06/30/18 0800    Visit Number  5    Number of Visits  12    Date for PT Re-Evaluation  07/13/18    PT Start Time  0715    PT Stop Time  0758    PT Time Calculation (min)  43 min    Activity Tolerance  Patient tolerated treatment well;No increased pain    Behavior During Therapy  WFL for tasks assessed/performed       Past Medical History:  Diagnosis Date  . Anxiety   . Crohn disease ([place])    Dr. Laverta Baltimore  . Depression    on prozac previously  . Hyperlipidemia   . Miscarriage     Past Surgical History:  Procedure Laterality Date  . CARDIAC SURGERY     as a child  . EYE SURGERY  10/30/2009   repair of detached retina  . TONSILLECTOMY  1965    There were no vitals filed for this visit.  Subjective Assessment - 06/30/18 0715    Subjective  c/o deep and dull ache has returned after going on vacation and "slacking off"    Patient Stated Goals  get rid of the pain and movew shoulder around better - has had shoulder pain in the shoulder blade area for a long time    Currently in Pain?  No/denies                       [facility] Adult PT Treatment/Exercise - 06/30/18 0715      Shoulder Exercises: Standing   External Rotation  Both;15 reps;Theraband    Theraband Level (Shoulder External Rotation)  Level 1 (Yellow)    Extension  Both;15 reps;Theraband    Theraband Level (Shoulder Extension)  Level 1 (Yellow)    Extension Limitations  5 sec hold; cues for technique    Row  Strengthening;Both;10 reps;Theraband    Theraband Level (Shoulder Row)  Level 3 (Green)    Row Limitations  5 sec hold    Diagonals   Right;Left;Theraband;15 reps    Theraband Level (Shoulder Diagonals)  Level 1 (Yellow)    Diagonals Limitations  cues needed; pt performing abduction more than diagonals    Other Standing Exercises  axial extension 10 sec x 10; scap squeeze 10 sec x 10; L's x 10; W's x 10 with swim noodle       Shoulder Exercises: ROM/Strengthening   UBE (Upper Arm Bike)  L2 x 4 min (2' each direction)      Shoulder Exercises: Stretch   Other Shoulder Stretches  3 position doorway stretch x 30 sec x 2 reps each position      Modalities   Modalities  --   pt declined      Manual Therapy   Soft tissue mobilization  STM to Lt pec/ant shoulder/upper trap                  PT Long Term Goals - 06/01/18 1145      PT LONG TERM GOAL #1   Title  Improve posture and alignment with patient to demonstrate improved upright posture with posterior shoulder girdle engaged 07/13/18  Time  6    Period  Weeks    Status  New      PT LONG TERM GOAL #2   Title  Increase AROM Lt shoulder to equal Rt shoulder 07/13/18    Time  6    Period  Weeks    Status  New      PT LONG TERM GOAL #3   Title  Decrease pain with patient to report ability to reach up and behind back with minimal to no pain 07/13/18    Time  6    Period  Weeks    Status  New      PT LONG TERM GOAL #4   Title  Independent in HEP 07/13/18    Time  6    Period  Weeks    Status  New      PT LONG TERM GOAL #5   Title  Improve FOTO to </= 32% limitation 07/13/18    Time  6    Period  Weeks    Status  New            Plan - 06/30/18 0800    Clinical Impression Statement  Pt returns after 2 weeks due to holidays and vacation.  Pt reports min increase in pain due to limited compliance with HEP while on vacation.  Session today mainly focused on review of HEP and manual therapy.  Will continue to benefit from PT to maximize function.    Rehab Potential  Good    PT Frequency  2x / week    PT Duration  6 weeks    PT  Treatment/Interventions  Patient/family education;ADLs/Self Care Home Management;Cryotherapy;Electrical Stimulation;Iontophoresis 64m/ml Dexamethasone;Moist Heat;Ultrasound;Dry needling;Manual techniques;Neuromuscular re-education;Therapeutic activities;Therapeutic exercise    PT Next Visit Plan  continue postural strengthening with good scapular control.  manual work and modalities as indicated. nearing end of POC    Consulted and Agree with Plan of Care  Patient       Patient will benefit from skilled therapeutic intervention in order to improve the following deficits and impairments:  Postural dysfunction, Improper body mechanics, Pain, Increased fascial restricitons, Increased muscle spasms, Hypermobility, Decreased mobility, Decreased range of motion, Decreased activity tolerance  Visit Diagnosis: Acute pain of left shoulder  Scapular dyskinesis  Other symptoms and signs involving the musculoskeletal system     Problem List Patient Active Problem List   Diagnosis Date Noted  . Acute shoulder bursitis, left 05/26/2018  . Left knee swelling with Baker's cyst 09/30/2017  . Status post cardiac surgery 09/05/2013  . Osteoporosis 10/10/2010  . BENIGN POSITIONAL VERTIGO 04/05/2010  . Regional enteritis ([place] 07/30/2009  . HEADACHE 07/30/2009  . POSTMENOPAUSAL STATUS 04/25/2009  . ANXIETY 09/20/2007  . Insomnia 09/20/2007       SLaureen Abrahams PT, DPT 06/30/18 8:02 AM    [facility] [address]Phone: [phone]  Fax:  [phone] Name: Diane KNIPFERMRN: [id number] of Birth: [birth year]

## 2018-07-07 ENCOUNTER — Encounter: Payer: Self-pay | Admitting: Rehabilitative and Restorative Service Providers"

## 2018-07-07 ENCOUNTER — Ambulatory Visit: Payer: BLUE CROSS/BLUE SHIELD | Admitting: Sports Medicine

## 2018-07-07 ENCOUNTER — Ambulatory Visit (INDEPENDENT_AMBULATORY_CARE_PROVIDER_SITE_OTHER): Payer: BLUE CROSS/BLUE SHIELD | Admitting: Rehabilitative and Restorative Service Providers"

## 2018-07-07 DIAGNOSIS — R29898 Other symptoms and signs involving the musculoskeletal system: Secondary | ICD-10-CM

## 2018-07-07 DIAGNOSIS — G2589 Other specified extrapyramidal and movement disorders: Secondary | ICD-10-CM | POA: Diagnosis not present

## 2018-07-07 DIAGNOSIS — M25512 Pain in left shoulder: Secondary | ICD-10-CM

## 2018-07-07 NOTE — Patient Instructions (Signed)

## 2018-07-07 NOTE — Therapy (Signed)
[place], [place], [address] Phone: [phone]   Fax:  [phone]  Physical Therapy Treatment  Patient Details  Name: Diane Chavez MRN: [id number] Date of Birth: Sep 17, 1955 Referring Provider (PT): Dr [name]    Encounter Date: 07/07/2018  PT End of Session - 07/07/18 1522    Visit Number  6    Number of Visits  12    Date for PT Re-Evaluation  08/18/18    PT Start Time  7654    PT Stop Time  1615    PT Time Calculation (min)  58 min    Activity Tolerance  Patient tolerated treatment well       Past Medical History:  Diagnosis Date  . Anxiety   . Crohn disease ([place])    Dr. Laverta Baltimore  . Depression    on prozac previously  . Hyperlipidemia   . Miscarriage     Past Surgical History:  Procedure Laterality Date  . CARDIAC SURGERY     as a child  . EYE SURGERY  10/30/2009   repair of detached retina  . TONSILLECTOMY  1965    There were no vitals filed for this visit.  Subjective Assessment - 07/07/18 1523    Subjective  Patient reports continued pain in the past week. She has not been consistent with exercises. Having pain with pulling clothes on and off and pulling covers up with Lt hand.     Currently in Pain?  Yes    Pain Score  5     Pain Location  Shoulder    Pain Orientation  Left    Pain Descriptors / Indicators  Sharp;Tightness;Tiring;Aching   sharp with IR dressing    Pain Type  Acute pain;Chronic pain    Pain Onset  More than a month ago    Pain Frequency  Intermittent         [facility] PT Assessment - 07/07/18 0001      Assessment   Medical Diagnosis  Lt shoulder pain and dysfunction    Referring Provider (PT)  Dr [name]     Onset Date/Surgical Date  02/14/18    Hand Dominance  Left    Next MD Visit  07/07/18    Prior Therapy  chiropractic care off and on for years       AROM   Right Shoulder Extension  50 Degrees    Right Shoulder Flexion  158 Degrees     Right Shoulder ABduction  176 Degrees    Right Shoulder Internal Rotation  35 Degrees    Right Shoulder External Rotation  94 Degrees    Left Shoulder Extension  58 Degrees    Left Shoulder Flexion  157 Degrees    Left Shoulder ABduction  158 Degrees    Left Shoulder Internal Rotation  34 Degrees    Left Shoulder External Rotation  95 Degrees      Strength   Overall Strength Comments  WFL's bilat UE's except middle and lower trap 4/5 to 4+/5 bilat       Palpation   Palpation comment  muscular tightness Lt > Rt pecs; upper trap; leveator; teres; lats; biceps; deltoid       Special Tests   Other special tests  scapular dyskinesis Lt > Rt with elevation of shoulders into flexion and scaption                    [facility] Adult PT Treatment/Exercise -  07/07/18 0001      Neuro Re-ed    Neuro Re-ed Details   neuromuscular re-ed working on posture and alignment pulling scapulae down and back; lifting chest - engaging posterior ahoulder girdle       Shoulder Exercises: Standing   Extension  Both;15 reps;Theraband    Theraband Level (Shoulder Extension)  Level 1 (Yellow)    Row  Strengthening;Both;10 reps;Theraband    Theraband Level (Shoulder Row)  Level 1 (Yellow)    Retraction  Strengthening;Both;20 reps;Theraband    Theraband Level (Shoulder Retraction)  Level 1 (Yellow)    Other Standing Exercises  axial extension 10 sec x 10; scap squeeze 10 sec x 10; L's x 10; W's x 10 with swim noodle       Shoulder Exercises: Stretch   Other Shoulder Stretches  3 position doorway stretch x 30 sec x 2 reps each position      Moist Heat Therapy   Number Minutes Moist Heat  20 Minutes    Moist Heat Location  Shoulder;Cervical      Electrical Stimulation   Electrical Stimulation Location  Lt shoulder girdle     Electrical Stimulation Action  IFC    Electrical Stimulation Parameters  to tolerance    Electrical Stimulation Goals  Pain;Tone      Iontophoresis   Type of Iontophoresis   Dexamethasone    Location  Lt infraspinitus TP     Dose  120 mAmp/1 cc load     Time  12 hours       Manual Therapy   Manual therapy comments  pt supine hooklying     Joint Mobilization  GH/scap mobs    Soft tissue mobilization  STM to Lt pec/ant shoulder/upper trap; teres/infraspinitus     Myofascial Release  Lt pec release.             PT Education - 07/07/18 1621    Education Details  HEP review Ionto     Person(s) Educated  Patient    Methods  Explanation;Demonstration;Tactile cues;Verbal cues;Handout    Comprehension  Verbalized understanding;Returned demonstration;Verbal cues required;Tactile cues required          PT Long Term Goals - 07/07/18 1700      PT LONG TERM GOAL #1   Title  Improve posture and alignment with patient to demonstrate improved upright posture with posterior shoulder girdle engaged 08/18/2018    Time  12    Period  Weeks    Status  Revised      PT LONG TERM GOAL #2   Title  Increase AROM Lt shoulder to equal Rt shoulder 08/18/2018    Time  12    Period  Weeks    Status  Revised      PT LONG TERM GOAL #3   Title  Decrease pain with patient to report ability to reach up and behind back with minimal to no pain 08/18/2018    Time  12    Period  Weeks    Status  Revised      PT LONG TERM GOAL #4   Title  Independent in HEP 08/18/2018    Time  12    Period  Weeks    Status  Revised      PT LONG TERM GOAL #5   Title  Improve FOTO to </= 32% limitation 08/18/2018    Time  12    Period  Weeks    Status  Revised  Plan - 07/07/18 1523    Clinical Impression Statement  [name] reports continued pain in the Lt shoulder which seems to be worsening. She hsa been inconsistent with HEP; is trying to work on posture but has difficulty remembering especially at work or when playing candy crush in soft sofa at night. Dicussed the importance of consistent exercis as well as postural correction to address shoulder pathology. Patient has good  ROM; continued pain with AROM Lt shoulder IR in abducted position; scapular dyskinesis with elevation of the Lt UE; muscular tightness through the Lt shoulder girdle with TP's in trap/infraspinitus/teres/pecs/biceps/deltoid. Patient will benefit from continued consistent therapy to address problems - trial of DN and continued neuromuscular re-eduation.     Rehab Potential  Good    PT Frequency  2x / week    PT Duration  6 weeks    PT Treatment/Interventions  Patient/family education;ADLs/Self Care Home Management;Cryotherapy;Electrical Stimulation;Iontophoresis 86m/ml Dexamethasone;Moist Heat;Ultrasound;Dry needling;Manual techniques;Neuromuscular re-education;Therapeutic activities;Therapeutic exercise    PT Next Visit Plan  continue postural strengthening with good scapular control.  manual work and modalities as indicated - trial of DN at next visit - FOTO    Consulted and Agree with Plan of Care  Patient       Patient will benefit from skilled therapeutic intervention in order to improve the following deficits and impairments:  Postural dysfunction, Improper body mechanics, Pain, Increased fascial restricitons, Increased muscle spasms, Hypermobility, Decreased mobility, Decreased range of motion, Decreased activity tolerance  Visit Diagnosis: Acute pain of left shoulder - Plan: PT plan of care cert/re-cert  Scapular dyskinesis - Plan: PT plan of care cert/re-cert  Other symptoms and signs involving the musculoskeletal system - Plan: PT plan of care cert/re-cert     Problem List Patient Active Problem List   Diagnosis Date Noted  . Acute shoulder bursitis, left 05/26/2018  . Left knee swelling with Baker's cyst 09/30/2017  . Status post cardiac surgery 09/05/2013  . Osteoporosis 10/10/2010  . BENIGN POSITIONAL VERTIGO 04/05/2010  . Regional enteritis ([place] 07/30/2009  . HEADACHE 07/30/2009  . POSTMENOPAUSAL STATUS 04/25/2009  . ANXIETY 09/20/2007  . Insomnia 09/20/2007     Tywan Siever PNilda SimmerPT, MPH  07/07/2018, 5:03 PM  [facility] [address]Phone: [phone]  Fax:  [phone] Name: RMERDITH ADANMRN: [id number] of Birth: 307-Dec-1957

## 2018-07-12 ENCOUNTER — Ambulatory Visit (INDEPENDENT_AMBULATORY_CARE_PROVIDER_SITE_OTHER): Payer: BLUE CROSS/BLUE SHIELD | Admitting: Rehabilitative and Restorative Service Providers"

## 2018-07-12 DIAGNOSIS — G2589 Other specified extrapyramidal and movement disorders: Secondary | ICD-10-CM

## 2018-07-12 DIAGNOSIS — M25512 Pain in left shoulder: Secondary | ICD-10-CM | POA: Diagnosis not present

## 2018-07-12 DIAGNOSIS — R29898 Other symptoms and signs involving the musculoskeletal system: Secondary | ICD-10-CM

## 2018-07-12 NOTE — Patient Instructions (Signed)

## 2018-07-12 NOTE — Therapy (Addendum)
[place], [place], [address] Phone: [phone]   Fax:  [phone]  Physical Therapy Treatment  Patient Details  Name: Diane Chavez MRN: [id number] Date of Birth: [birth year] Referring Provider (PT): Dr [name]    Encounter Date: 07/12/2018  PT End of Session - 07/12/18 0846    Visit Number  7    Number of Visits  12    Date for PT Re-Evaluation  08/18/18    PT Start Time  0758    PT Stop Time  0852    PT Time Calculation (min)  54 min    Activity Tolerance  Patient tolerated treatment well       Past Medical History:  Diagnosis Date  . Anxiety   . Crohn disease ([place])    Dr. Laverta Baltimore  . Depression    on prozac previously  . Hyperlipidemia   . Miscarriage     Past Surgical History:  Procedure Laterality Date  . CARDIAC SURGERY     as a child  . EYE SURGERY  10/30/2009   repair of detached retina  . TONSILLECTOMY  1965    There were no vitals filed for this visit.                    [facility] Adult PT Treatment/Exercise - 07/12/18 0001      Shoulder Exercises: Standing   Other Standing Exercises  axial extension 10 sec x 10; scap squeeze 10 sec x 10; L's x 10; W's x 10 with swim noodle       Shoulder Exercises: Stretch   Other Shoulder Stretches  3 position doorway stretch x 30 sec x 2 reps each position      Moist Heat Therapy   Number Minutes Moist Heat  20 Minutes    Moist Heat Location  Shoulder;Cervical      Electrical Stimulation   Electrical Stimulation Location  Lt shoulder girdle     Electrical Stimulation Action  IFC    Electrical Stimulation Parameters  to tolerance    Electrical Stimulation Goals  Pain;Tone      Manual Therapy   Manual therapy comments  pt prone and supine hooklying     Joint Mobilization  GH/scap mobs    Soft tissue mobilization  STM to Lt pec/ant shoulder/upper trap; teres/infraspinitus     Passive ROM  Stretching into  shoulder flexion with stabilization of scapula to disassociate humerus and scapula; IR/ER/ abd        Trigger Point Dry Needling - 07/12/18 0903    Consent Given?  Yes    Education Handout Provided  Yes    Muscles Treated Upper Body  Upper trapezius;Levator scapulae;Infraspinatus   Lt and teres    Upper Trapezius Response  Palpable increased muscle length    Levator Scapulae Response  Palpable increased muscle length    Infraspinatus Response  Palpable increased muscle length           PT Education - 07/12/18 0905    Education Details  HEP DN    Person(s) Educated  Patient    Methods  Explanation    Comprehension  Verbalized understanding          PT Long Term Goals - 07/07/18 1700      PT LONG TERM GOAL #1   Title  Improve posture and alignment with patient to demonstrate improved upright posture with posterior shoulder girdle engaged 08/18/2018    Time  12    Period  Weeks    Status  Revised      PT LONG TERM GOAL #2   Title  Increase AROM Lt shoulder to equal Rt shoulder 08/18/2018    Time  12    Period  Weeks    Status  Revised      PT LONG TERM GOAL #3   Title  Decrease pain with patient to report ability to reach up and behind back with minimal to no pain 08/18/2018    Time  12    Period  Weeks    Status  Revised      PT LONG TERM GOAL #4   Title  Independent in HEP 08/18/2018    Time  12    Period  Weeks    Status  Revised      PT LONG TERM GOAL #5   Title  Improve FOTO to </= 32% limitation 08/18/2018    Time  12    Period  Weeks    Status  Revised            Plan - 07/12/18 0846    Clinical Impression Statement  Improving shoulder pain with consistent exercises at home. Tolerated DN followed by manual work Lt posterior shoulder girdle well with good release of muscular tightness. Patient demonstrates improved scapular positioning. Progressing well toward goals.     Rehab Potential  Good    PT Frequency  2x / week    PT Duration  6 weeks    PT  Treatment/Interventions  Patient/family education;ADLs/Self Care Home Management;Cryotherapy;Electrical Stimulation;Iontophoresis 21m/ml Dexamethasone;Moist Heat;Ultrasound;Dry needling;Manual techniques;Neuromuscular re-education;Therapeutic activities;Therapeutic exercise    PT Next Visit Plan  continue postural strengthening with good scapular control.  Assess response to DN and manual work. continue manual work; nRadiation protection practitionerand modalities as indicated    Consulted and Agree with Plan of Care  Patient       Patient will benefit from skilled therapeutic intervention in order to improve the following deficits and impairments:  Postural dysfunction, Improper body mechanics, Pain, Increased fascial restricitons, Increased muscle spasms, Hypermobility, Decreased mobility, Decreased range of motion, Decreased activity tolerance  Visit Diagnosis: Acute pain of left shoulder  Scapular dyskinesis  Other symptoms and signs involving the musculoskeletal system     Problem List Patient Active Problem List   Diagnosis Date Noted  . Acute shoulder bursitis, left 05/26/2018  . Left knee swelling with Baker's cyst 09/30/2017  . Status post cardiac surgery 09/05/2013  . Osteoporosis 10/10/2010  . BENIGN POSITIONAL VERTIGO 04/05/2010  . Regional enteritis ([place] 07/30/2009  . HEADACHE 07/30/2009  . POSTMENOPAUSAL STATUS 04/25/2009  . ANXIETY 09/20/2007  . Insomnia 09/20/2007    Celyn PNilda SimmerPT, MPH  07/12/2018, 9:06 AM  [facility] [address] [address]Phone: [phone]  Fax:  [phone] Name: Diane LIMBURGMRN: [id number] of Birth: [birth year] PHYSICAL THERAPY DISCHARGE SUMMARY  Visits from Start of Care: 7  Current functional level related to goals / functional outcomes: See progress note for discharge status    Remaining deficits: Continued symptoms    Education /  Equipment: HEP  Plan: Patient agrees to discharge.  Patient goals were not met. Patient is being discharged due to not returning since the last visit.  ?????    Celyn P. HHelene KelpPT, MPH 08/12/18 9:55 AM

## 2018-07-15 ENCOUNTER — Encounter: Payer: BLUE CROSS/BLUE SHIELD | Admitting: Rehabilitative and Restorative Service Providers"

## 2018-07-20 ENCOUNTER — Encounter: Payer: BLUE CROSS/BLUE SHIELD | Admitting: Rehabilitative and Restorative Service Providers"

## 2018-07-23 ENCOUNTER — Encounter: Payer: BLUE CROSS/BLUE SHIELD | Admitting: Rehabilitative and Restorative Service Providers"

## 2018-08-04 ENCOUNTER — Encounter: Payer: Self-pay | Admitting: Sports Medicine

## 2018-08-04 ENCOUNTER — Ambulatory Visit (INDEPENDENT_AMBULATORY_CARE_PROVIDER_SITE_OTHER): Payer: BLUE CROSS/BLUE SHIELD | Admitting: Sports Medicine

## 2018-08-04 DIAGNOSIS — M7552 Bursitis of left shoulder: Secondary | ICD-10-CM | POA: Diagnosis not present

## 2018-08-04 DIAGNOSIS — M25512 Pain in left shoulder: Secondary | ICD-10-CM

## 2018-08-04 DIAGNOSIS — G8929 Other chronic pain: Secondary | ICD-10-CM

## 2018-08-04 NOTE — Progress Notes (Signed)
Subjective:    CC: Follow-up  HPI: This is a very pleasant 63 year old female, we have been treating her for shoulder pain for a couple of months now.  Ultimately we ended up doing an injection with ultrasound guidance, she had good relief but temporary.  She has done several sessions of formal PT, activity modification with persistent discomfort.  I reviewed the past medical history, family history, social history, surgical history, and allergies today and no changes were needed.  Please see the problem list section below in epic for further details.  Past Medical History: Past Medical History:  Diagnosis Date  . Anxiety   . Crohn disease ([place])    Dr. Laverta Baltimore  . Depression    on prozac previously  . Hyperlipidemia   . Miscarriage    Past Surgical History: Past Surgical History:  Procedure Laterality Date  . CARDIAC SURGERY     as a child  . EYE SURGERY  10/30/2009   repair of detached retina  . TONSILLECTOMY  1965   Social History: Social History   Socioeconomic History  . Marital status: Married    Spouse name: [name]  . Number of children: 3  . Years of education: Not on file  . Highest education level: Not on file  Occupational History  . 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 Financial resource strain: Not on file  . Food insecurity:    Worry: Not on file    Inability: Not on file  . Transportation needs:    Medical: Not on file    Non-medical: Not on file  Tobacco Use  . Smoking status: Former Smoker    Last attempt to quit: 06/16/1974    Years since quitting: 44.1  . Smokeless tobacco: Never Used  Substance and Sexual Activity  . Alcohol use: Yes    Alcohol/week: 7.0 standard drinks    Types: 7 Glasses of wine per week  . Drug use: No  . Sexual activity: Not on file  Lifestyle  . Physical activity:    Days per week: Not on file    Minutes per session: Not on file  . Stress: Not on file  Relationships  . Social connections:      Talks on phone: Not on file    Gets together: Not on file    Attends religious service: Not on file    Active member of club or organization: Not on file    Attends meetings of clubs or organizations: Not on file    Relationship status: Not on file  Other Topics Concern  . Not on file  Social History Narrative   Lives with husband.Regular exercise-no.   Family History: Family History  Problem Relation Age of Onset  . Alzheimer's disease Mother    Allergies: No Known Allergies Medications: See med rec.  Review of Systems: No fevers, chills, night sweats, weight loss, chest pain, or shortness of breath.   Objective:    General: Well Developed, well nourished, and in no acute distress.  Neuro: Alert and oriented x3, extra-ocular muscles intact, sensation grossly intact.  HEENT: Normocephalic, atraumatic, pupils equal round reactive to light, neck supple, no masses, no lymphadenopathy, thyroid nonpalpable.  Skin: Warm and dry, no rashes. Cardiac: Regular rate and rhythm, no murmurs rubs or gallops, no lower extremity edema.  Respiratory: Clear to auscultation bilaterally. Not using accessory muscles, speaking in full sentences.  Impression and Recommendations:    Acute shoulder bursitis, left Brief  improvement with injection back in December She has done a good trial of formal physical therapy. Unfortunately persistent impingement symptoms. Proceeding with MRI and I would like a surgical evaluation with [name]. ___________________________________________ Gwen Her. [name], M.D., ABFM., CAQSM. Primary Care and Sports Medicine [facility]  Adjunct Professor of [employer]

## 2018-08-04 NOTE — Assessment & Plan Note (Signed)
Brief improvement with injection back in December She has done a good trial of formal physical therapy. Unfortunately persistent impingement symptoms. Proceeding with MRI and I would like a surgical evaluation with [name].

## 2018-08-11 DIAGNOSIS — H5213 Myopia, bilateral: Secondary | ICD-10-CM | POA: Diagnosis not present

## 2018-08-12 ENCOUNTER — Ambulatory Visit
Admission: RE | Admit: 2018-08-12 | Discharge: 2018-08-12 | Disposition: A | Discharge status: Home / Self Care | Payer: BLUE CROSS/BLUE SHIELD | Source: Ambulatory Visit | Attending: Sports Medicine | Admitting: Sports Medicine

## 2018-08-12 DIAGNOSIS — M25512 Pain in left shoulder: Secondary | ICD-10-CM

## 2018-08-12 DIAGNOSIS — M19012 Primary osteoarthritis, left shoulder: Secondary | ICD-10-CM | POA: Diagnosis not present

## 2018-08-12 DIAGNOSIS — G8929 Other chronic pain: Secondary | ICD-10-CM

## 2018-08-12 DIAGNOSIS — M7552 Bursitis of left shoulder: Secondary | ICD-10-CM

## 2018-08-17 ENCOUNTER — Encounter: Payer: Self-pay | Admitting: Family Medicine

## 2018-08-17 DIAGNOSIS — M25512 Pain in left shoulder: Secondary | ICD-10-CM | POA: Diagnosis not present

## 2018-08-23 ENCOUNTER — Telehealth: Payer: Self-pay | Admitting: Family Medicine

## 2018-08-23 ENCOUNTER — Ambulatory Visit (INDEPENDENT_AMBULATORY_CARE_PROVIDER_SITE_OTHER): Payer: BLUE CROSS/BLUE SHIELD | Admitting: Family Medicine

## 2018-08-23 ENCOUNTER — Encounter: Payer: Self-pay | Admitting: Family Medicine

## 2018-08-23 VITALS — BP 108/60 | HR 55 | Ht 69.0 in | Wt 120.0 lb

## 2018-08-23 DIAGNOSIS — R012 Other cardiac sounds: Secondary | ICD-10-CM | POA: Diagnosis not present

## 2018-08-23 DIAGNOSIS — Z01818 Encounter for other preprocedural examination: Secondary | ICD-10-CM | POA: Insufficient documentation

## 2018-08-23 LAB — COMPLETE METABOLIC PANEL WITH GFR
AG Ratio: 2 (calc) (ref 1.0–2.5)
ALT: 18 U/L (ref 6–29)
AST: 22 U/L (ref 10–35)
Albumin: 4.7 g/dL (ref 3.6–5.1)
Alkaline phosphatase (APISO): 61 U/L (ref 37–153)
BUN: 15 mg/dL (ref 7–25)
CO2: 31 mmol/L (ref 20–32)
Calcium: 9.6 mg/dL (ref 8.6–10.4)
Chloride: 100 mmol/L (ref 98–110)
Creat: 0.72 mg/dL (ref 0.50–0.99)
GFR, Est African American: 105 mL/min/{1.73_m2} (ref >=60)
GFR, Est Non African American: 90 mL/min/{1.73_m2} (ref >=60)
GLOBULIN: 2.3 g/dL (ref 1.9–3.7)
Glucose, Bld: 81 mg/dL (ref 65–99)
Potassium: 4.2 mmol/L (ref 3.5–5.3)
SODIUM: 137 mmol/L (ref 135–146)
Total Bilirubin: 0.5 mg/dL (ref 0.2–1.2)
Total Protein: 7 g/dL (ref 6.1–8.1)

## 2018-08-23 LAB — CBC
HEMATOCRIT: 39.6 % (ref 35.0–45.0)
Hemoglobin: 13.3 g/dL (ref 11.7–15.5)
MCH: 31.6 pg (ref 27.0–33.0)
MCHC: 33.6 g/dL (ref 32.0–36.0)
MCV: 94.1 fL (ref 80.0–100.0)
MPV: 10 fL (ref 7.5–12.5)
PLATELETS: 190 10*3/uL (ref 140–400)
RBC: 4.21 10*6/uL (ref 3.80–5.10)
RDW: 12 % (ref 11.0–15.0)
WBC: 5.1 10*3/uL (ref 3.8–10.8)

## 2018-08-23 NOTE — Telephone Encounter (Signed)
Pt advised and OK to proceed.

## 2018-08-23 NOTE — Progress Notes (Signed)
Established Patient Office Visit  Subjective:  Patient ID: Diane Chavez, female    DOB: 02/12/1956  Age: 63 y.o. MRN: [id number]  CC:  Chief Complaint  Patient presents with  . sugical clearance     HPI Diane Chavez presents for pre-op for left shoulder clearance.  She has a history of cardiac surgery as a child.  She said that she had a hole in her heart repaired.  She is not had any problems or or symptoms since then.  And no recent chest pain shortness of breath or palpitations.  She also has a history of Crohn's disease and is currently on mesalamine 4 tabs daily.  She has a follow-up in early April with GI.  Not currently taking any NSAIDs but she is taking fish oil.  She did recently have a chest x-ray in December when she was seen for respiratory symptoms at the urgent care.  Noted some marked hyperinflation but she is very tall and thin as well. + nausea and HA.    Past Medical History:  Diagnosis Date  . Anxiety   . Crohn disease ([place])    Dr. Laverta [place]  . Depression    on prozac previously  . Hyperlipidemia   . Miscarriage     Past Surgical History:  Procedure Laterality Date  . CARDIAC SURGERY     as a child  . EYE SURGERY  10/30/2009   repair of detached retina  . TONSILLECTOMY  1965    Family History  Problem Relation Age of Onset  . Alzheimer's disease Mother     Social History   Socioeconomic History  . Marital status: Married    Spouse name: [name]  . Number of children: 3  . Years of education: Not on file  . Highest education level: Not on file  Occupational History  . 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 Financial resource strain: Not on file  . Food insecurity:    Worry: Not on file    Inability: Not on file  . Transportation needs:    Medical: Not on file    Non-medical: Not on file  Tobacco Use  . Smoking status: Former Smoker    Last attempt to quit: 06/16/1974    Years since quitting: 44.2  . Smokeless  tobacco: Never Used  Substance and Sexual Activity  . Alcohol use: Yes    Alcohol/week: 7.0 standard drinks    Types: 7 Glasses of wine per week  . Drug use: No  . Sexual activity: Not on file  Lifestyle  . Physical activity:    Days per week: Not on file    Minutes per session: Not on file  . Stress: Not on file  Relationships  . Social connections:    Talks on phone: Not on file    Gets together: Not on file    Attends religious service: Not on file    Active member of club or organization: Not on file    Attends meetings of clubs or organizations: Not on file    Relationship status: Not on file  . Intimate partner violence:    Fear of current or ex partner: Not on file    Emotionally abused: Not on file    Physically abused: Not on file    Forced sexual activity: Not on file  Other Topics Concern  . Not on file  Social History Narrative   Lives with husband.Regular exercise-no.  Outpatient Medications Prior to Visit  Medication Sig Dispense Refill  . B Complex-Folic Acid (B COMPLEX PLUS PO) Take 1 tablet by mouth daily.      Marland Kitchen BORON PO Take by mouth.    . Calcium Carbonate-Vit D-Min (CALCIUM 1200 PO) Take by mouth.      . cholecalciferol (VITAMIN D) 1000 units tablet Take 1,000 Units by mouth daily.    . LUTEIN PO Take 1 tablet by mouth daily.    . meloxicam (MOBIC) 15 MG tablet One tab PO qAM with breakfast for 2 weeks, then daily prn pain. 30 tablet 3  . mesalamine (LIALDA) 1.2 g EC tablet Take by mouth daily with breakfast.    . Omega-3 Fatty Acids (FISH OIL PO) Take 2 capsules by mouth daily.    Marland Kitchen VITAMIN K PO Take by mouth.     No facility-administered medications prior to visit.     No Known Allergies  ROS Review of Systems    Objective:    Physical Exam  Constitutional: She is oriented to person, place, and time. She appears well-developed and well-nourished.  HENT:  Head: Normocephalic and atraumatic.  Right Ear: External ear normal.  Left Ear:  External ear normal.  Nose: Nose normal.  Mouth/Throat: Oropharynx is clear and moist.  TMs and canals are clear.   Eyes: Pupils are equal, round, and reactive to light. Conjunctivae and EOM are normal.  Neck: Neck supple. No thyromegaly present.  Cardiovascular: Normal rate and regular rhythm.  2nd heart sound is a loud thump.   Pulmonary/Chest: Effort normal and breath sounds normal. She has no wheezes.  Abdominal: Soft. Bowel sounds are normal. She exhibits no distension. There is no abdominal tenderness.  Musculoskeletal:        General: No edema.  Lymphadenopathy:    She has no cervical adenopathy.  Neurological: She is alert and oriented to person, place, and time. She has normal reflexes.  Skin: Skin is warm and dry.  Psychiatric: She has a normal mood and affect. Her behavior is normal.    BP 108/60   Pulse (!) 55   Ht 5' 9"  (1.753 m)   Wt 120 lb (54.4 kg)   LMP 06/11/2000   SpO2 100%   BMI 17.72 kg/m  Wt Readings from Last 3 Encounters:  08/23/18 120 lb (54.4 kg)  08/04/18 118 lb (53.5 kg)  06/13/18 113 lb (51.3 kg)     Health Maintenance Due  Topic Date Due  . MAMMOGRAM  10/15/2017    There are no preventive care reminders to display for this patient.  Lab Results  Component Value Date   TSH 0.58 10/15/2017   Lab Results  Component Value Date   WBC 5.1 08/23/2018   HGB 13.3 08/23/2018   HCT 39.6 08/23/2018   MCV 94.1 08/23/2018   PLT 190 08/23/2018   Lab Results  Component Value Date   NA 137 08/23/2018   K 4.2 08/23/2018   CO2 31 08/23/2018   GLUCOSE 81 08/23/2018   BUN 15 08/23/2018   CREATININE 0.72 08/23/2018   BILITOT 0.5 08/23/2018   ALKPHOS 59 09/26/2016   AST 22 08/23/2018   ALT 18 08/23/2018   PROT 7.0 08/23/2018   ALBUMIN 4.1 09/26/2016   CALCIUM 9.6 08/23/2018   Lab Results  Component Value Date   CHOL 235 (H) 10/15/2017   Lab Results  Component Value Date   HDL 83 10/15/2017   Lab Results  Component Value Date  [place] 138 (H) 10/15/2017   Lab Results  Component Value Date   TRIG 46 10/15/2017   Lab Results  Component Value Date   CHOLHDL 2.8 10/15/2017   No results found for: HGBA1C    Assessment & Plan:   Problem List Items Addressed This Visit      Other   Pre-operative clearance   Relevant Orders   COMPLETE METABOLIC PANEL WITH GFR (Completed)   CBC (Completed)   EKG 12-Lead   ECHOCARDIOGRAM COMPLETE    Other Visit Diagnoses    Abnormal heart sounds    -  Primary   Relevant Orders   ECHOCARDIOGRAM COMPLETE     Patient is cleared for arthroscopic left surgical shoulder procedure.  Even with her history of prior cardiac surgery as a child she has not had any complications or current symptoms.  EKG today showed a rate of 58 bpm, mild bradycardia with a Q wave in lead V1 and V2 but this is unchanged from previous EKGs.  We did discuss avoiding all anti-inflammatories including Aleve and ibuprofen as well as her fish oil starting 1 week prior to surgical date.  CBC obtained.  Hyperinflation of lungs on chest x-ray-she is completely asymptomatic but we did discussed maybe doing spirometry this summer.  No orders of the defined types were placed in this encounter.   Follow-up: No follow-ups on file.    Beatrice Lecher, MD

## 2018-08-23 NOTE — Telephone Encounter (Signed)
pleases call patient: I would like to get an echocardiogram of her heart to evaluate the pumping function especially with the abnormal heart sound and her previous history of surgery.

## 2018-08-23 NOTE — Patient Instructions (Signed)
Avoid all anti-inflammatories including Aleve and ibuprofen as well as her fish oil starting 1 week prior to your surgical date. Please schedule your mammogram at your convenience.

## 2018-08-24 ENCOUNTER — Encounter: Payer: Self-pay | Admitting: Family Medicine

## 2018-08-24 NOTE — Progress Notes (Signed)
All labs are normal. 

## 2018-08-25 NOTE — Telephone Encounter (Signed)
Pt has an appointment on 3/17 for an echo.Diane Chavez, [place]

## 2018-08-31 ENCOUNTER — Other Ambulatory Visit: Payer: Self-pay

## 2018-08-31 ENCOUNTER — Telehealth: Payer: Self-pay | Admitting: *Deleted

## 2018-08-31 ENCOUNTER — Ambulatory Visit (HOSPITAL_BASED_OUTPATIENT_CLINIC_OR_DEPARTMENT_OTHER)
Admission: RE | Admit: 2018-08-31 | Discharge: 2018-08-31 | Disposition: A | Discharge status: Home / Self Care | Payer: BLUE CROSS/BLUE SHIELD | Source: Ambulatory Visit | Attending: Family Medicine | Admitting: Family Medicine

## 2018-08-31 DIAGNOSIS — Z01818 Encounter for other preprocedural examination: Secondary | ICD-10-CM | POA: Insufficient documentation

## 2018-08-31 DIAGNOSIS — R012 Other cardiac sounds: Secondary | ICD-10-CM | POA: Diagnosis not present

## 2018-08-31 NOTE — Progress Notes (Signed)
  Echocardiogram 2D Echocardiogram has been performed.  [name] T Connelly [place] 08/31/2018, 1:50 PM

## 2018-08-31 NOTE — Telephone Encounter (Signed)
preop OV notes,labs,ekg, faxed to ATTN: Sherri. Confirmation received. Form scanned and placed in pt's chart.Diane Chavez, Diane Chavez, CMA

## 2018-09-01 ENCOUNTER — Encounter: Payer: Self-pay | Admitting: Family Medicine

## 2018-09-17 DIAGNOSIS — K5100 Ulcerative (chronic) pancolitis without complications: Secondary | ICD-10-CM | POA: Diagnosis not present

## 2018-10-01 ENCOUNTER — Telehealth: Payer: BLUE CROSS/BLUE SHIELD | Admitting: Family

## 2018-10-01 DIAGNOSIS — M545 Low back pain, unspecified: Secondary | ICD-10-CM

## 2018-10-01 DIAGNOSIS — G8929 Other chronic pain: Secondary | ICD-10-CM

## 2018-10-01 MED ORDER — NAPROXEN 500 MG PO TABS: 500.0000 mg | ORAL_TABLET | Freq: Two times a day (BID) | ORAL | 0 refills | Status: DC

## 2018-10-01 MED ORDER — CYCLOBENZAPRINE HCL 10 MG PO TABS: 10.0000 mg | ORAL_TABLET | Freq: Three times a day (TID) | ORAL | 0 refills | Status: DC | PRN

## 2018-10-01 NOTE — Progress Notes (Signed)

## 2018-10-04 ENCOUNTER — Encounter: Payer: Self-pay | Admitting: Sports Medicine

## 2018-10-04 ENCOUNTER — Other Ambulatory Visit: Payer: Self-pay

## 2018-10-04 ENCOUNTER — Ambulatory Visit (INDEPENDENT_AMBULATORY_CARE_PROVIDER_SITE_OTHER): Payer: BLUE CROSS/BLUE SHIELD

## 2018-10-04 ENCOUNTER — Ambulatory Visit: Payer: BLUE CROSS/BLUE SHIELD | Admitting: Sports Medicine

## 2018-10-04 DIAGNOSIS — M5136 Other intervertebral disc degeneration, lumbar region: Secondary | ICD-10-CM | POA: Diagnosis not present

## 2018-10-04 DIAGNOSIS — M545 Low back pain: Secondary | ICD-10-CM | POA: Diagnosis not present

## 2018-10-04 DIAGNOSIS — M51369 Other intervertebral disc degeneration, lumbar region without mention of lumbar back pain or lower extremity pain: Secondary | ICD-10-CM | POA: Insufficient documentation

## 2018-10-04 MED ORDER — PREDNISONE 50 MG PO TABS: ORAL_TABLET | ORAL | 0 refills | Status: DC

## 2018-10-04 NOTE — Progress Notes (Signed)
Subjective:    CC: Acute low back pain  HPI: For the past week or so this pleasant 63 year old female has had pain that she localizes in the midline of her low back with radiation to the anus.  No saddle numbness, no constitutional symptoms.  She is having a bit of weakness in both feet.  Pain is worse with sitting, flexion, Valsalva, moderate, persistent.  I reviewed the past medical history, family history, social history, surgical history, and allergies today and no changes were needed.  Please see the problem list section below in epic for further details.  Past Medical History: Past Medical History:  Diagnosis Date  . Anxiety   . Crohn disease ([place])    Dr. Laverta Baltimore  . Depression    on prozac previously  . Hyperlipidemia   . Miscarriage    Past Surgical History: Past Surgical History:  Procedure Laterality Date  . CARDIAC SURGERY     as a child  . EYE SURGERY  10/30/2009   repair of detached retina  . TONSILLECTOMY  1965   Social History: Social History   Socioeconomic History  . Marital status: Married    Spouse name: [name]  . Number of children: 3  . Years of education: Not on file  . Highest education level: Not on file  Occupational History  . 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 Financial resource strain: Not on file  . Food insecurity:    Worry: Not on file    Inability: Not on file  . Transportation needs:    Medical: Not on file    Non-medical: Not on file  Tobacco Use  . Smoking status: Former Smoker    Last attempt to quit: 06/16/1974    Years since quitting: 44.3  . Smokeless tobacco: Never Used  Substance and Sexual Activity  . Alcohol use: Yes    Alcohol/week: 7.0 standard drinks    Types: 7 Glasses of wine per week  . Drug use: No  . Sexual activity: Not on file  Lifestyle  . Physical activity:    Days per week: Not on file    Minutes per session: Not on file  . Stress: Not on file  Relationships  . Social  connections:    Talks on phone: Not on file    Gets together: Not on file    Attends religious service: Not on file    Active member of club or organization: Not on file    Attends meetings of clubs or organizations: Not on file    Relationship status: Not on file  Other Topics Concern  . Not on file  Social History Narrative   Lives with husband.Regular exercise-no.   Family History: Family History  Problem Relation Age of Onset  . Alzheimer's disease Mother    Allergies: No Known Allergies Medications: See med rec.  Review of Systems: No fevers, chills, night sweats, weight loss, chest pain, or shortness of breath.   Objective:    General: Well Developed, well nourished, and in no acute distress.  Neuro: Alert and oriented x3, extra-ocular muscles intact, sensation grossly intact.  HEENT: Normocephalic, atraumatic, pupils equal round reactive to light, neck supple, no masses, no lymphadenopathy, thyroid nonpalpable.  Skin: Warm and dry, no rashes. Cardiac: Regular rate and rhythm, no murmurs rubs or gallops, no lower extremity edema.  Respiratory: Clear to auscultation bilaterally. Not using accessory muscles, speaking in full sentences. Back Exam:  Inspection: Unremarkable  Motion: Flexion 45 deg, Extension 45 deg, Side Bending to 45 deg bilaterally,  Rotation to 45 deg bilaterally  SLR laying: Negative  XSLR laying: Negative  Palpable tenderness: None. FABER: negative. Sensory change: Gross sensation intact to all lumbar and sacral dermatomes.  Reflexes: 2+ at both patellar tendons, 2+ at achilles tendons, Babinski's downgoing.  Strength at foot  Plantar-flexion: 5/5 Dorsi-flexion: 4/5 Eversion: 5/5 Inversion: 5/5  Leg strength  Quad: 5/5 Hamstring: 5/5 Hip flexor: 5/5 Hip abductors: 5/5  Gait unremarkable.  Impression and Recommendations:    Lumbar degenerative disc disease Axial discogenic back pain. She does have some weakness to foot dorsiflexion bilaterally  but requests to hold off on advanced imaging, risks and benefits explained. She can switch her muscle relaxer to bedtime use only. X-rays, prednisone, rehabilitation exercises, return to see me in 1 month, MR for interventional planning if no better.   ___________________________________________ Gwen Her. [name], M.D., ABFM., CAQSM. Primary Care and Sports Medicine [facility]  Adjunct Professor of [employer] [facility] [employer]

## 2018-10-04 NOTE — Assessment & Plan Note (Signed)
Axial discogenic back pain. She does have some weakness to foot dorsiflexion bilaterally but requests to hold off on advanced imaging, risks and benefits explained. She can switch her muscle relaxer to bedtime use only. X-rays, prednisone, rehabilitation exercises, return to see me in 1 month, MR for interventional planning if no better.

## 2018-11-01 ENCOUNTER — Ambulatory Visit: Payer: BLUE CROSS/BLUE SHIELD | Admitting: Sports Medicine

## 2018-11-16 ENCOUNTER — Encounter: Payer: Self-pay | Admitting: Sports Medicine

## 2018-11-29 DIAGNOSIS — S43432A Superior glenoid labrum lesion of left shoulder, initial encounter: Secondary | ICD-10-CM | POA: Diagnosis not present

## 2018-11-29 DIAGNOSIS — G8918 Other acute postprocedural pain: Secondary | ICD-10-CM | POA: Diagnosis not present

## 2018-11-29 DIAGNOSIS — M19012 Primary osteoarthritis, left shoulder: Secondary | ICD-10-CM | POA: Diagnosis not present

## 2018-11-29 DIAGNOSIS — M7522 Bicipital tendinitis, left shoulder: Secondary | ICD-10-CM | POA: Diagnosis not present

## 2018-11-29 DIAGNOSIS — M7542 Impingement syndrome of left shoulder: Secondary | ICD-10-CM | POA: Diagnosis not present

## 2018-11-29 DIAGNOSIS — M24112 Other articular cartilage disorders, left shoulder: Secondary | ICD-10-CM | POA: Diagnosis not present

## 2018-11-29 DIAGNOSIS — M7552 Bursitis of left shoulder: Secondary | ICD-10-CM | POA: Diagnosis not present

## 2018-11-29 DIAGNOSIS — M948X1 Other specified disorders of cartilage, shoulder: Secondary | ICD-10-CM | POA: Diagnosis not present

## 2018-12-03 NOTE — Progress Notes (Signed)
Greater than 5 minutes, yet less than 10 minutes of time have been spent researching, coordinating, and implementing care for this patient today.  Thank you for the details you included in the comment boxes. Those details are very helpful in determining the best course of treatment for you and help us to provide the best care.  

## 2018-12-07 DIAGNOSIS — M19012 Primary osteoarthritis, left shoulder: Secondary | ICD-10-CM | POA: Diagnosis not present

## 2018-12-15 ENCOUNTER — Ambulatory Visit (INDEPENDENT_AMBULATORY_CARE_PROVIDER_SITE_OTHER): Payer: BC Managed Care – PPO | Admitting: Rehabilitative and Restorative Service Providers"

## 2018-12-15 ENCOUNTER — Other Ambulatory Visit: Payer: Self-pay

## 2018-12-15 ENCOUNTER — Encounter: Payer: Self-pay | Admitting: Rehabilitative and Restorative Service Providers"

## 2018-12-15 DIAGNOSIS — R29898 Other symptoms and signs involving the musculoskeletal system: Secondary | ICD-10-CM

## 2018-12-15 DIAGNOSIS — M25512 Pain in left shoulder: Secondary | ICD-10-CM | POA: Diagnosis not present

## 2018-12-15 DIAGNOSIS — G2589 Other specified extrapyramidal and movement disorders: Secondary | ICD-10-CM | POA: Diagnosis not present

## 2018-12-15 NOTE — Therapy (Signed)
[place] [facility] [place], [place], [address] Phone: [phone]   Fax:  [phone]  Physical Therapy Evaluation  Patient Details  Name: Diane Chavez MRN: [id number] Date of Birth: Sep 10, 1955 Referring Provider (PT): Dr Ophelia Charter    Encounter Date: 12/15/2018  PT End of Session - 12/15/18 1549    Visit Number  1    Number of Visits  24    Date for PT Re-Evaluation  03/09/19    PT Start Time  1504    PT Stop Time  2993    PT Time Calculation (min)  50 min    Activity Tolerance  Patient tolerated treatment well       Past Medical History:  Diagnosis Date  . Anxiety   . Crohn disease ([place])    Dr. Laverta Baltimore  . Depression    on prozac previously  . Hyperlipidemia   . Miscarriage     Past Surgical History:  Procedure Laterality Date  . CARDIAC SURGERY     as a child  . EYE SURGERY  10/30/2009   repair of detached retina  . TONSILLECTOMY  1965    There were no vitals filed for this visit.   Subjective Assessment - 12/15/18 1501    Subjective  Patient reports that she has had Lt shoudler pain for several months with no known injury. She has had injections and PT with continued pain and limitations. She underwent torn biceps, DCR; SAD 11/29/2018.    Pertinent History  osteoporosis; cardiac surgery 1964; 1969; [facility]; detached retina 2011    Currently in Pain?  Yes    Pain Score  2    movement increases pain to 6/10   Pain Location  Shoulder    Pain Orientation  Left    Pain Descriptors / Indicators  Aching;Dull;Throbbing    Pain Type  Surgical pain;Chronic pain    Pain Radiating Towards  into the neck    Pain Onset  1 to 4 weeks ago    Pain Frequency  Intermittent    Aggravating Factors   movement; lrying to sleep; looking down    Pain Relieving Factors  changing positions; meds; ice         [facility] PT Assessment - 12/15/18 0001      Assessment   Medical Diagnosis  Lt shoulder biceps tendon repair;  SAD; DCR     Referring Provider (PT)  Dr Ophelia Charter     Onset Date/Surgical Date  11/29/18   pain since fall of 2019    Hand Dominance  Left    Next MD Visit  01/08/2019    Prior Therapy  yes 7 visits here for shd pain 12/19       Precautions   Precautions  None      Restrictions   Weight Bearing Restrictions  No      Balance Screen   Has the patient fallen in the past 6 months  No    Has the patient had a decrease in activity level because of a fear of falling?   No    Is the patient reluctant to leave their home because of a fear of falling?   No      Prior Function   Level of Independence  Independent    Vocation  Full time employment    Vocation Requirements  computer/desk work     Leisure  household chores; walking; hhiking       Observation/Other  Assessments   Skin Integrity  incisions dry and intact - 4 portals Lt shoulder     Focus on Therapeutic Outcomes (FOTO)   72% limitation       Sensation   Additional Comments  WFL's per pt report       Posture/Postural Control   Posture Comments  shoulders rounded and elevated; head of the humerus forward in orientation; Lt scapula/shoulder elevated       AROM   Right/Left Shoulder  --   Lt not tested due to surgery    Right Shoulder Extension  58 Degrees    Right Shoulder Flexion  173 Degrees    Right Shoulder ABduction  164 Degrees    Right Shoulder Internal Rotation  25 Degrees    Right Shoulder External Rotation  114 Degrees    Cervical Flexion  42    Cervical Extension  34    Cervical - Right Side Bend  39    Cervical - Left Side Bend  34    Cervical - Right Rotation  65    Cervical - Left Rotation  65      Strength   Overall Strength Comments  not tested due to surgery       Palpation   Palpation comment  muscular tightness Lt upper quarter                 Objective measurements completed on examination: See above findings.      [facility] PT Treatment/Exercise - 12/15/18 0001      Neuro  Re-ed    Neuro Re-ed Details   neuromuscular re-ed working on posture and alignment pulling scapulae down and back; lifting chest - engaging posterior ahoulder girdle       Shoulder Exercises: Standing   Other Standing Exercises  axial extension 10 sec x 5; scap squeeze 10 sec x 5 with noodle       Shoulder Exercises: ROM/Strengthening   Pendulum  20-25 circles - poor quality movement patterns       Shoulder Exercises: Stretch   External Rotation Stretch  5 reps;10 seconds   standing noodle along spine elbows 90 deg using cane Rt UE   Table Stretch - Flexion  5 reps;10 seconds             PT Education - 12/15/18 1548    Education Details  HEP post op POC    Person(s) Educated  Patient    Methods  Explanation;Demonstration;Tactile cues;Verbal cues;Handout    Comprehension  Verbalized understanding;Returned demonstration;Verbal cues required;Tactile cues required       PT Short Term Goals - 12/15/18 1557      PT SHORT TERM GOAL #1   Title  Independent in initial HEP 01/25/2019    Time  6    Period  Weeks    Status  New      PT SHORT TERM GOAL #2   Title  patient reports sleeping without awakening due to shoulder pain 01/25/2019    Time  6    Period  Weeks    Status  New        PT Long Term Goals - 12/15/18 1553      PT LONG TERM GOAL #1   Title  Improve posture and alignment with patient to demonstrate improved upright posture with posterior shoulder girdle engaged 03/09/2019    Time  12    Period  Weeks    Status  New  PT LONG TERM GOAL #2   Title  Increase AROM Lt shoulder to equal Rt shoulder 03/09/2019    Time  12    Period  Weeks    Status  New      PT LONG TERM GOAL #3   Title  Decrease pain with patient to report ability to reach up and behind back with minimal to no pain 03/09/2019    Time  12    Period  Weeks      PT LONG TERM GOAL #4   Title  Independent in HEP 03/09/2019    Time  12    Period  Weeks    Status  New      PT LONG TERM GOAL  #5   Title  Improve FOTO to </= 37% limitation 03/09/2019    Time  12    Period  Weeks    Status  New             Plan - 12/15/18 1549    Clinical Impression Statement  Patient presents s/p Lt shoulder biceps tendon repair; DCR; SAD 11/29/2018 after 8-10 months of Lt shoulder pain and dysfunction. She has pain; limited shoulder ROM and mobility; decresaed functional strength; abnormal posture and alignment; decreased functional activity tolerance. Patient will benefit from PT to address problems identified.    Stability/Clinical Decision Making  Stable/Uncomplicated    Clinical Decision Making  Low    Rehab Potential  Good    PT Frequency  2x / week    PT Duration  6 weeks    PT Treatment/Interventions  Patient/family education;ADLs/Self Care Home Management;Cryotherapy;Electrical Stimulation;Iontophoresis 7m/ml Dexamethasone;Moist Heat;Ultrasound;Manual techniques;Neuromuscular re-education;Functional mobility training;Therapeutic activities;Therapeutic exercise    PT Next Visit Plan  review HEP; progress with PROM; scapular stabilization; manual work/PROM/stretching; postural correction; modalities as indicated    PT Home Exercise Plan  [email or web address]    Consulted and Agree with Plan of Care  Patient       Patient will benefit from skilled therapeutic intervention in order to improve the following deficits and impairments:  Pain, Increased fascial restricitons, Increased muscle spasms, Impaired UE functional use, Decreased strength, Decreased mobility, Decreased range of motion, Decreased activity tolerance  Visit Diagnosis: 1. Acute pain of left shoulder   2. Scapular dyskinesis   3. Other symptoms and signs involving the musculoskeletal system        Problem List Patient Active Problem List   Diagnosis Date Noted  . Lumbar degenerative disc disease 10/04/2018  . Pre-operative clearance 08/23/2018  . Acute shoulder bursitis, left 05/26/2018  . Left knee swelling with Baker's  cyst 09/30/2017  . Status post cardiac surgery 09/05/2013  . Osteoporosis 10/10/2010  . BENIGN POSITIONAL VERTIGO 04/05/2010  . [place] 07/30[place]2009  . HEADACHE 07/30/2009  . POSTMENOPAUSAL STATUS 04/25/2009  . ANXIETY 09/20/2007  . Insomnia 09/20/2007    Milo Schreier PNilda SimmerPT, MPH  12/15/2018, 5:08 PM  [facility] [address]Phone: [phone]  Fax:  [phone] Name: Diane UEHARAMRN: [id number] of Birth: [birth year]

## 2018-12-15 NOTE — Patient Instructions (Signed)
Access Code: [email or web address]  URL: [email or web address]  Date: 12/15/2018  Prepared by: Gillermo Murdoch   Exercises  Seated Cervical Retraction - 10 reps - 1 sets - 3x daily - 7x weekly  Standing Scapular Retraction - 10 reps - 1 sets - 10 hold - 3x daily - 7x weekly  Elbow AROM Flexion & Extension Supinated Forearm - 5-10 reps - 1 sets - 10 sec hold - 2x daily - 7x weekly  Seated Shoulder Flexion Towel Slide at Table Top - 10 reps - 1 sets - 10 sec hold - 2x daily - 7x weekly  Standing Single Arm Shoulder External Rotation - 3 reps - 1 sets - 30 sec hold - 2x daily - 7x weekly  Circular Shoulder Pendulum with Table Support - 30-35 reps - 1 sets - 2x daily - 7x weekly  Standing Backward Shoulder Rolls - 10-15 reps - 1 sets - 2x daily - 7x weekly

## 2018-12-20 ENCOUNTER — Ambulatory Visit (INDEPENDENT_AMBULATORY_CARE_PROVIDER_SITE_OTHER): Payer: BC Managed Care – PPO | Admitting: Rehabilitative and Restorative Service Providers"

## 2018-12-20 ENCOUNTER — Other Ambulatory Visit: Payer: Self-pay

## 2018-12-20 ENCOUNTER — Encounter: Payer: Self-pay | Admitting: Rehabilitative and Restorative Service Providers"

## 2018-12-20 DIAGNOSIS — M25512 Pain in left shoulder: Secondary | ICD-10-CM

## 2018-12-20 DIAGNOSIS — G2589 Other specified extrapyramidal and movement disorders: Secondary | ICD-10-CM | POA: Diagnosis not present

## 2018-12-20 DIAGNOSIS — R29898 Other symptoms and signs involving the musculoskeletal system: Secondary | ICD-10-CM

## 2018-12-20 NOTE — Therapy (Signed)
[place], [place], [address] Phone: [phone]   Fax:  [phone]  Physical Therapy Treatment  Patient Details  Name: Diane Chavez MRN: [id number] Date of Birth: 1956-03-17 Referring Provider (PT): Dr Ophelia Charter    Encounter Date: 12/20/2018  PT End of Session - 12/20/18 1556    Visit Number  2    Number of Visits  24    Date for PT Re-Evaluation  03/09/19    PT Start Time  0488    PT Stop Time  1655   heat end of treatment   PT Time Calculation (min)  59 min    Activity Tolerance  Patient tolerated treatment well       Past Medical History:  Diagnosis Date  . Anxiety   . Crohn disease ([place])    Dr. Laverta Baltimore  . Depression    on prozac previously  . Hyperlipidemia   . Miscarriage     Past Surgical History:  Procedure Laterality Date  . CARDIAC SURGERY     as a child  . EYE SURGERY  10/30/2009   repair of detached retina  . TONSILLECTOMY  1965    There were no vitals filed for this visit.  Subjective Assessment - 12/20/18 1559    Subjective  Patient reports that she has been doing her exercises at home. She has increased pain in the shoulder which is irritated due to working with sling in place. Still sleeping in the sling - makes t part of the night then goes to the recliner.    Currently in Pain?  Yes    Pain Score  6     Pain Location  Shoulder    Pain Orientation  Left    Pain Descriptors / Indicators  Aching;Dull;Throbbing         [facility] PT Assessment - 12/20/18 0001      Assessment   Medical Diagnosis  Lt shoulder biceps tendon repair; SAD; DCR     Referring Provider (PT)  Dr Ophelia Charter     Onset Date/Surgical Date  11/29/18   pain since fall of 2019    Hand Dominance  Left    Next MD Visit  01/08/2019    Prior Therapy  yes 7 visits here for shd pain 12/19       PROM   Left Shoulder Extension  20 Degrees    Left Shoulder Flexion  99 Degrees    Left Shoulder  ABduction  93 Degrees    Left Shoulder External Rotation  20 Degrees                   [facility] Adult PT Treatment/Exercise - 12/20/18 0001      Shoulder Exercises: Standing   Other Standing Exercises  axial extension 10 sec x 5; scap squeeze 10 sec x 5 with noodle       Shoulder Exercises: Stretch   Internal Rotation Stretch  5 reps   10 sec x 5 reps initially with cane then strap   External Rotation Stretch  5 reps;10 seconds   standing noodle along spine elbows 90 deg using cane Rt UE   Table Stretch - Flexion  5 reps;10 seconds    Other Shoulder Stretches  shoulder extension with cane - 10 sec x 5 reps       Moist Heat Therapy   Number Minutes Moist Heat  10 Minutes    Moist Heat Location  Shoulder  Lt     Manual Therapy   Manual therapy comments  pt supine     Joint Mobilization  Lt shoulder PA and AP and inferior glided    Soft tissue mobilization  deep tissue mobilization through the pecs; biceps; deltoid; teres     Myofascial Release  anterior shoulder    Scapular Mobilization  Lt     Passive ROM  Lt shoulder flexion; abduction; extension; ER in scaption              PT Education - 12/20/18 1648    Education Details  HEP    Person(s) Educated  Patient    Methods  Explanation;Demonstration;Tactile cues;Verbal cues;Handout    Comprehension  Verbalized understanding;Returned demonstration;Verbal cues required;Tactile cues required       PT Short Term Goals - 12/15/18 1557      PT SHORT TERM GOAL #1   Title  Independent in initial HEP 01/25/2019    Time  6    Period  Weeks    Status  New      PT SHORT TERM GOAL #2   Title  patient reports sleeping without awakening due to shoulder pain 01/25/2019    Time  6    Period  Weeks    Status  New        PT Long Term Goals - 12/15/18 1553      PT LONG TERM GOAL #1   Title  Improve posture and alignment with patient to demonstrate improved upright posture with posterior shoulder girdle engaged  03/09/2019    Time  12    Period  Weeks    Status  New      PT LONG TERM GOAL #2   Title  Increase AROM Lt shoulder to equal Rt shoulder 03/09/2019    Time  12    Period  Weeks    Status  New      PT LONG TERM GOAL #3   Title  Decrease pain with patient to report ability to reach up and behind back with minimal to no pain 03/09/2019    Time  12    Period  Weeks      PT LONG TERM GOAL #4   Title  Independent in HEP 03/09/2019    Time  12    Period  Weeks    Status  New      PT LONG TERM GOAL #5   Title  Improve FOTO to </= 37% limitation 03/09/2019    Time  12    Period  Weeks    Status  New            Plan - 12/20/18 1556    Clinical Impression Statement  Good compliance with HEP. Reviewed HEP, correcting technique as needed. Added new exercises without difficulty. Added manual work and PROM to pt tolerance. Gradual progress. Three weeks post op - tight shoulder.    Stability/Clinical Decision Making  Stable/Uncomplicated    Rehab Potential  Good    PT Frequency  2x / week    PT Duration  6 weeks    PT Treatment/Interventions  Patient/family education;ADLs/Self Care Home Management;Cryotherapy;Electrical Stimulation;Iontophoresis 17m/ml Dexamethasone;Moist Heat;Ultrasound;Manual techniques;Neuromuscular re-education;Functional mobility training;Therapeutic activities;Therapeutic exercise    PT Next Visit Plan  review HEP; progress with PROM; scapular stabilization; manual work/PROM/stretching; postural correction; modalities as indicated    PT Home Exercise Plan  [email or web address]    Consulted and Agree with Plan of Care  Patient  Patient will benefit from skilled therapeutic intervention in order to improve the following deficits and impairments:  Pain, Increased fascial restricitons, Increased muscle spasms, Impaired UE functional use, Decreased strength, Decreased mobility, Decreased range of motion, Decreased activity tolerance  Visit Diagnosis: 1. Acute pain of left  shoulder   2. Scapular dyskinesis   3. Other symptoms and signs involving the musculoskeletal system        Problem List Patient Active Problem List   Diagnosis Date Noted  . Lumbar degenerative disc disease 10/04/2018  . Pre-operative clearance 08/23/2018  . Acute shoulder bursitis, left 05/26/2018  . Left knee swelling with Baker's cyst 09/30/2017  . Status post cardiac surgery 09/05/2013  . Osteoporosis 10/10/2010  . BENIGN POSITIONAL VERTIGO 04/05/2010  . Regional enteritis ([place]) 07/30/2009  . HEADACHE 07/30/2009  . POSTMENOPAUSAL STATUS 04/25/2009  . ANXIETY 09/20/2007  . Insomnia 09/20/2007    Ellieana Dolecki [name] PT, MPH  12/20/2018, 4:55 PM  [facility] [place], [place], [address] Phone: [phone]   Fax:  [phone]  Name: Diane Chavez MRN: [id number] Date of Birth: 09-16-1955

## 2018-12-20 NOTE — Patient Instructions (Signed)
Access Code: [email or web address]  URL: [email or web address]  Date: 12/20/2018  Prepared by: Gillermo Murdoch   Exercises  Seated Cervical Retraction - 10 reps - 1 sets - 3x daily - 7x weekly  Standing Scapular Retraction - 10 reps - 1 sets - 10 hold - 3x daily - 7x weekly  Elbow AROM Flexion & Extension Supinated Forearm - 5-10 reps - 1 sets - 10 sec hold - 2x daily - 7x weekly  Seated Shoulder Flexion Towel Slide at Table Top - 10 reps - 1 sets - 10 sec hold - 2x daily - 7x weekly  Standing Single Arm Shoulder External Rotation - 3 reps - 1 sets - 30 sec hold - 2x daily - 7x weekly  Circular Shoulder Pendulum with Table Support - 30-35 reps - 1 sets - 2x daily - 7x weekly  Standing Backward Shoulder Rolls - 10-15 reps - 1 sets - 2x daily - 7x weekly  Standing Shoulder Extension ROM with [place] - 5-10 reps - 1 sets - 10 sec hold - 2x daily - 7x weekly  Standing Shoulder Internal Rotation Stretch with Towel - 5-10 reps - 1 sets - 10 sec hold - 2x daily - 7x weekly

## 2018-12-23 ENCOUNTER — Ambulatory Visit (INDEPENDENT_AMBULATORY_CARE_PROVIDER_SITE_OTHER): Payer: BC Managed Care – PPO | Admitting: Physical Therapy

## 2018-12-23 ENCOUNTER — Encounter: Payer: Self-pay | Admitting: Physical Therapy

## 2018-12-23 ENCOUNTER — Other Ambulatory Visit: Payer: Self-pay

## 2018-12-23 DIAGNOSIS — R29898 Other symptoms and signs involving the musculoskeletal system: Secondary | ICD-10-CM | POA: Diagnosis not present

## 2018-12-23 DIAGNOSIS — G2589 Other specified extrapyramidal and movement disorders: Secondary | ICD-10-CM | POA: Diagnosis not present

## 2018-12-23 DIAGNOSIS — M25512 Pain in left shoulder: Secondary | ICD-10-CM | POA: Diagnosis not present

## 2018-12-23 NOTE — Therapy (Signed)
[place], [place], [address] Phone: [phone]   Fax:  [phone]  Physical Therapy Treatment  Patient Details  Name: Diane Chavez MRN: [id number] Date of Birth: December 02, 1955 Referring Provider (PT): Dr Ophelia Charter    Encounter Date: 12/23/2018  PT End of Session - 12/23/18 1704    Visit Number  3    Number of Visits  24    Date for PT Re-Evaluation  03/09/19    PT Start Time  9833    PT Stop Time  8250   MHP last 10 min   PT Time Calculation (min)  53 min    Activity Tolerance  Patient tolerated treatment well    Behavior During Therapy  [facility] for tasks assessed/performed       Past Medical History:  Diagnosis Date  . Anxiety   . Crohn disease ([place])    Dr. Laverta Baltimore  . Depression    on prozac previously  . Hyperlipidemia   . Miscarriage     Past Surgical History:  Procedure Laterality Date  . CARDIAC SURGERY     as a child  . EYE SURGERY  10/30/2009   repair of detached retina  . TONSILLECTOMY  1965    There were no vitals filed for this visit.  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  She reports she has been mindful of not picking anything up in LUE.    Pertinent History  osteoporosis; cardiac surgery 1964; 1969; [place]; detached retina 2011    Currently in Pain?  No/denies    Pain Score  0-No pain   up to 5/39 with certain motions.   Pain Location  Shoulder    Pain Orientation  Left    Pain Descriptors / Indicators  Aching;Dull    Pain Radiating Towards  into the neck    Aggravating Factors   movement, end of the day    Pain Relieving Factors  ice, medication         [facility] PT Assessment - 12/23/18 0001      Assessment   Medical Diagnosis  Lt shoulder biceps tendon repair; SAD; DCR     Referring Provider (PT)  Dr Ophelia Charter     Onset Date/Surgical Date  11/29/18    pain since fall of 2019    Hand Dominance  Left    Next MD Visit  01/08/2019    Prior Therapy  yes 7 visits here for shd pain 12/19        [facility] Adult PT Treatment/Exercise - 12/23/18 0001      Shoulder Exercises: Supine   External Rotation  AAROM;Both;5 reps   Cane   Flexion  AAROM;Both;5 reps   cane   Other Supine Exercises  Rt arm assisting Lt hand to forehead x 5 reps       Shoulder Exercises: Standing   Internal Rotation  AAROM;Both;10 reps   cane, then strap for 2nd set   Extension  AAROM;Both;10 reps   cane   Other Standing Exercises  pendulum x 10 CW/CCW, 2 sets      Shoulder Exercises: Stretch   External Rotation Stretch  10 seconds;5 reps   standing noodle along spine elbows 90 deg using cane Rt UE   Table Stretch - Flexion  5 reps;10 seconds    Table Stretch -  Abduction  5 reps;10 seconds      Moist Heat Therapy   Number Minutes Moist Heat  10 Minutes    Moist Heat Location  Shoulder   Lt     Manual Therapy   Manual Therapy  Passive ROM;Taping    Passive ROM  Lt shoulder flexion, scaption, and ER    Kinesiotex  Create Space      Kinesiotix   Create Space  I strip of sensitive skin applied over ant and lateral shoulder covering incisions to assist with scar management and decrease sensitivity.                PT Short Term Goals - 12/15/18 1557      PT SHORT TERM GOAL #1   Title  Independent in initial HEP 01/25/2019    Time  6    Period  Weeks    Status  New      PT SHORT TERM GOAL #2   Title  patient reports sleeping without awakening due to shoulder pain 01/25/2019    Time  6    Period  Weeks    Status  New        PT Long Term Goals - 12/15/18 1553      PT LONG TERM GOAL #1   Title  Improve posture and alignment with patient to demonstrate improved upright posture with posterior shoulder girdle engaged 03/09/2019    Time  12    Period  Weeks    Status  New      PT LONG TERM GOAL #2   Title  Increase AROM Lt shoulder to equal Rt  shoulder 03/09/2019    Time  12    Period  Weeks    Status  New      PT LONG TERM GOAL #3   Title  Decrease pain with patient to report ability to reach up and behind back with minimal to no pain 03/09/2019    Time  12    Period  Weeks      PT LONG TERM GOAL #4   Title  Independent in HEP 03/09/2019    Time  12    Period  Weeks    Status  New      PT LONG TERM GOAL #5   Title  Improve FOTO to </= 37% limitation 03/09/2019    Time  12    Period  Weeks    Status  New            Plan - 12/23/18 1742    Clinical Impression Statement  Pt initially guarded with passive stretches to Lt shoulder, but was able to ease in them with time.  Lt shoulder ROM gradually progressing. Goals are ongoing at this time.    Stability/Clinical Decision Making  Stable/Uncomplicated    Rehab Potential  Good    PT Frequency  2x / week    PT Duration  6 weeks    PT Treatment/Interventions  Patient/family education;ADLs/Self Care Home Management;Cryotherapy;Electrical Stimulation;Iontophoresis 23m/ml Dexamethasone;Moist Heat;Ultrasound;Manual techniques;Neuromuscular re-education;Functional mobility training;Therapeutic activities;Therapeutic exercise    PT Next Visit Plan  review HEP; progress with PROM; scapular stabilization; manual work/PROM/stretching; postural correction; modalities as indicated    PT Home Exercise Plan  [email or web address]    Consulted and Agree with Plan of Care  Patient       Patient will benefit from skilled therapeutic intervention in order to improve the following deficits and impairments:  Pain, Increased fascial restricitons, Increased muscle  spasms, Impaired UE functional use, Decreased strength, Decreased mobility, Decreased range of motion, Decreased activity tolerance  Visit Diagnosis: 1. Acute pain of left shoulder   2. Scapular dyskinesis   3. Other symptoms and signs involving the musculoskeletal system        Problem List Patient Active Problem List   Diagnosis Date  Noted  . Lumbar degenerative disc disease 10/04/2018  . Pre-operative clearance 08/23/2018  . Acute shoulder bursitis, left 05/26/2018  . Left knee swelling with Baker's cyst 09/30/2017  . Status post cardiac surgery 09/05/2013  . Osteoporosis 10/10/2010  . BENIGN POSITIONAL VERTIGO 04/05/2010  . Regional enteritis ([place]) 07/30/2009  . HEADACHE 07/30/2009  . POSTMENOPAUSAL STATUS 04/25/2009  . ANXIETY 09/20/2007  . Insomnia 09/20/2007   Kerin Perna, PTA 12/23/18 5:53 PM  [place], [place], [address] Phone: [phone]   Fax:  [phone]  Name: Diane Chavez MRN: [id number] Date of Birth: Dec 20, 1955

## 2018-12-27 ENCOUNTER — Other Ambulatory Visit: Payer: Self-pay

## 2018-12-27 ENCOUNTER — Encounter: Payer: Self-pay | Admitting: Rehabilitative and Restorative Service Providers"

## 2018-12-27 ENCOUNTER — Ambulatory Visit (INDEPENDENT_AMBULATORY_CARE_PROVIDER_SITE_OTHER): Payer: BC Managed Care – PPO | Admitting: Rehabilitative and Restorative Service Providers"

## 2018-12-27 DIAGNOSIS — R29898 Other symptoms and signs involving the musculoskeletal system: Secondary | ICD-10-CM

## 2018-12-27 DIAGNOSIS — M25512 Pain in left shoulder: Secondary | ICD-10-CM

## 2018-12-27 DIAGNOSIS — G2589 Other specified extrapyramidal and movement disorders: Secondary | ICD-10-CM

## 2018-12-27 NOTE — Therapy (Signed)
[place], [place], [address] Phone: [phone]   Fax:  [phone]  Physical Therapy Treatment  Patient Details  Name: Diane Chavez MRN: [id number] Date of Birth: 1956-06-10 Referring Provider (PT): Dr Ophelia Charter    Encounter Date: 12/27/2018  PT End of Session - 12/27/18 1600    Visit Number  4    Number of Visits  24    Date for PT Re-Evaluation  03/09/19    PT Start Time  1556    PT Stop Time  6073    PT Time Calculation (min)  49 min    Activity Tolerance  Patient tolerated treatment well       Past Medical History:  Diagnosis Date  . Anxiety   . Crohn disease ([place])    Dr. Laverta Baltimore  . Depression    on prozac previously  . Hyperlipidemia   . Miscarriage     Past Surgical History:  Procedure Laterality Date  . CARDIAC SURGERY     as a child  . EYE SURGERY  10/30/2009   repair of detached retina  . TONSILLECTOMY  1965    There were no vitals filed for this visit.  Subjective Assessment - 12/27/18 1653    Subjective  Out of sling most of the day today and shoulder is really hurting - put the sling back on at 3pm - shoulder still hurts - better post treatment    Currently in Pain?  Yes    Pain Score  4     Pain Location  Shoulder    Pain Orientation  Left    Pain Descriptors / Indicators  Aching;Tightness;Dull    Pain Type  Surgical pain;Chronic pain                       [facility] Adult PT Treatment/Exercise - 12/27/18 0001      Elbow Exercises   Elbow Flexion  --   elbow extension 20-30 sec stretch x 2 reps - good ROM      Shoulder Exercises: Supine   External Rotation  AAROM;Left;5 reps   Cane   Flexion  AAROM;Left;5 reps   assisting with Rt UE to move Lt resting Lt hand on head    Other Supine Exercises  PT assisting with to maintain Rt UE iin supported shd flexion with elbow flexed - pt preforming trunk rotation to the Rt x 4 reps at 30-45 sec hold        Shoulder Exercises: Seated   Other Seated Exercises  seated lateral cervical flexion 10-15 sec hold x 4 reps to Rt x 2 to lt with chin tucked       Shoulder Exercises: Standing   Internal Rotation  AAROM;Left;10 reps   cane, then strap for 2nd set   Extension  AAROM;Left;10 reps   cane   Other Standing Exercises  pendulum x 10 CW/CCW, 2 sets    Other Standing Exercises  scap squeeze with noodle 10 sec x 10       Shoulder Exercises: Stretch   External Rotation Stretch  10 seconds;5 reps   standing noodle along spine elbows 90 deg using cane Rt UE   Table Stretch - Flexion  5 reps;10 seconds      Manual Therapy   Manual therapy comments  pt sitting and supine     Joint Mobilization  Lt shoulder PA and AP and inferior glide; gentle GH movement  Soft tissue mobilization  deep tissue mobilization through upper trap ad posterior shoulder girdle/scapular area into the pecs; biceps; deltoid; teres Lt upper quarter pt sitting and supine     Myofascial Release  posterior scapular and upper trap area     Scapular Mobilization  Lt     Passive ROM  Lt shoulder flexion, scaption, and ER               PT Short Term Goals - 12/15/18 1557      PT SHORT TERM GOAL #1   Title  Independent in initial HEP 01/25/2019    Time  6    Period  Weeks    Status  New      PT SHORT TERM GOAL #2   Title  patient reports sleeping without awakening due to shoulder pain 01/25/2019    Time  6    Period  Weeks    Status  New        PT Long Term Goals - 12/15/18 1553      PT LONG TERM GOAL #1   Title  Improve posture and alignment with patient to demonstrate improved upright posture with posterior shoulder girdle engaged 03/09/2019    Time  12    Period  Weeks    Status  New      PT LONG TERM GOAL #2   Title  Increase AROM Lt shoulder to equal Rt shoulder 03/09/2019    Time  12    Period  Weeks    Status  New      PT LONG TERM GOAL #3   Title  Decrease pain with patient to report ability  to reach up and behind back with minimal to no pain 03/09/2019    Time  12    Period  Weeks      PT LONG TERM GOAL #4   Title  Independent in HEP 03/09/2019    Time  12    Period  Weeks    Status  New      PT LONG TERM GOAL #5   Title  Improve FOTO to </= 37% limitation 03/09/2019    Time  12    Period  Weeks    Status  New            Plan - 12/27/18 1601    Clinical Impression Statement  Continued scapular dyskinesis and abnormal movement patterns noted. Decreased GH mobility and ROM and scapula moving with [place] motion. Limited PROM. Working with manual techniques and PROM with some improvement in PROM - added exercise for home and continued educatin re shoulder function and the rehab process.    Stability/Clinical Decision Making  Stable/Uncomplicated    Rehab Potential  Good    PT Frequency  2x / week    PT Duration  6 weeks    PT Treatment/Interventions  Patient/family education;ADLs/Self Care Home Management;Cryotherapy;Electrical Stimulation;Iontophoresis 66m/ml Dexamethasone;Moist Heat;Ultrasound;Manual techniques;Neuromuscular re-education;Functional mobility training;Therapeutic activities;Therapeutic exercise    PT Next Visit Plan  review HEP; progress with PROM; scapular stabilization; manual work/PROM/stretching; postural correction; modalities as indicated    PT Home Exercise Plan  [email or web address]    Consulted and Agree with Plan of Care  Patient       Patient will benefit from skilled therapeutic intervention in order to improve the following deficits and impairments:  Pain, Increased fascial restricitons, Increased muscle spasms, Impaired UE functional use, Decreased strength, Decreased mobility, Decreased range of motion, Decreased activity tolerance  Visit Diagnosis:  1. Acute pain of left shoulder   2. Scapular dyskinesis   3. Other symptoms and signs involving the musculoskeletal system        Problem List Patient Active Problem List   Diagnosis Date Noted  .  Lumbar degenerative disc disease 10/04/2018  . Pre-operative clearance 08/23/2018  . Acute shoulder bursitis, left 05/26/2018  . Left knee swelling with Baker's cyst 09/30/2017  . Status post cardiac surgery 09/05/2013  . Osteoporosis 10/10/2010  . BENIGN POSITIONAL VERTIGO 04/05/2010  . Regional enteritis ([place]) 07/30/2009  . HEADACHE 07/30/2009  . POSTMENOPAUSAL STATUS 04/25/2009  . ANXIETY 09/20/2007  . Insomnia 09/20/2007    Kiko Ripp [name] PT, MPH  12/27/2018, 5:05 PM  [facility] [place], [place], [address] Phone: [phone]   Fax:  [phone]  Name: YURY SCHAUS MRN: [id number] Date of Birth: Oct 13, 1955

## 2018-12-30 ENCOUNTER — Encounter: Payer: Self-pay | Admitting: Rehabilitative and Restorative Service Providers"

## 2018-12-30 ENCOUNTER — Other Ambulatory Visit: Payer: Self-pay

## 2018-12-30 ENCOUNTER — Ambulatory Visit (INDEPENDENT_AMBULATORY_CARE_PROVIDER_SITE_OTHER): Payer: BC Managed Care – PPO | Admitting: Rehabilitative and Restorative Service Providers"

## 2018-12-30 DIAGNOSIS — M25512 Pain in left shoulder: Secondary | ICD-10-CM | POA: Diagnosis not present

## 2018-12-30 DIAGNOSIS — G2589 Other specified extrapyramidal and movement disorders: Secondary | ICD-10-CM

## 2018-12-30 DIAGNOSIS — R29898 Other symptoms and signs involving the musculoskeletal system: Secondary | ICD-10-CM | POA: Diagnosis not present

## 2018-12-30 NOTE — Therapy (Signed)
[place] [address] [place], [place], [address] Phone: [phone]   Fax:  [phone]  Physical Therapy Treatment  Patient Details  Name: Diane Chavez MRN: [id number] Date of Birth: February 25, 1956 Referring Provider (PT): Dr Ophelia Charter    Encounter Date: 12/30/2018  PT End of Session - 12/30/18 1743    Visit Number  5    Number of Visits  24    Date for PT Re-Evaluation  03/09/19    PT Start Time  6503    PT Stop Time  1733    PT Time Calculation (min)  47 min    Activity Tolerance  Patient tolerated treatment well       Past Medical History:  Diagnosis Date  . Anxiety   . Crohn disease ([place])    Dr. Laverta Baltimore  . Depression    on prozac previously  . Hyperlipidemia   . Miscarriage     Past Surgical History:  Procedure Laterality Date  . CARDIAC SURGERY     as a child  . EYE SURGERY  10/30/2009   repair of detached retina  . TONSILLECTOMY  1965    There were no vitals filed for this visit.  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 Workingon her exercises and thinks she is doing better with her movement.    Currently in Pain?  Yes    Pain Score  6     Pain Location  Shoulder    Pain Orientation  Left    Pain Descriptors / Indicators  Aching;Tightness;Dull    Pain Type  Surgical pain;Chronic pain                       [facility] Adult PT Treatment/Exercise - 12/30/18 0001      Shoulder Exercises: Supine   External Rotation  AAROM;Left;5 reps   Cane   Flexion  AAROM;Left;5 reps   assisting with Rt UE to move Lt resting Lt hand on head    Other Supine Exercises  PT assisting with to maintain Rt UE iin supported shd flexion with elbow flexed - pt preforming trunk rotation to the Rt x 4 reps at 30-45 sec hold       Shoulder Exercises: Sidelying   Internal Rotation Limitations  hand in back pocket dropping elbow down  20 sec x 2 reps PT assist       Shoulder Exercises: Standing   Internal Rotation  AAROM;Left;10 reps   cane, then strap for 2nd set   Extension  AAROM;Left;10 reps   cane     Shoulder Exercises: Stretch   External Rotation Stretch  10 seconds;5 reps   standing noodle along spine elbows 90 deg using cane Rt UE   Table Stretch - Flexion  5 reps;10 seconds      Manual Therapy   Manual therapy comments  pt supine and Rt sidelying     Joint Mobilization  Lt shoulder PA and AP and inferior glide; gentle GH movement     Soft tissue mobilization  deep tissue mobilization through upper trap ad posterior shoulder girdle/scapular area into the pecs; biceps; deltoid; teres Lt upper quarter pt sitting and supine     Myofascial Release  posterior scapular and upper trap area     Scapular Mobilization  Lt     Passive ROM  Lt shoulder flexion, scaption,  and ER; extension; horizontal abduction                PT Short Term Goals - 12/15/18 1557      PT SHORT TERM GOAL #1   Title  Independent in initial HEP 01/25/2019    Time  6    Period  Weeks    Status  New      PT SHORT TERM GOAL #2   Title  patient reports sleeping without awakening due to shoulder pain 01/25/2019    Time  6    Period  Weeks    Status  New        PT Long Term Goals - 12/15/18 1553      PT LONG TERM GOAL #1   Title  Improve posture and alignment with patient to demonstrate improved upright posture with posterior shoulder girdle engaged 03/09/2019    Time  12    Period  Weeks    Status  New      PT LONG TERM GOAL #2   Title  Increase AROM Lt shoulder to equal Rt shoulder 03/09/2019    Time  12    Period  Weeks    Status  New      PT LONG TERM GOAL #3   Title  Decrease pain with patient to report ability to reach up and behind back with minimal to no pain 03/09/2019    Time  12    Period  Weeks      PT LONG TERM GOAL #4   Title  Independent in HEP 03/09/2019    Time  12    Period  Weeks    Status  New       PT LONG TERM GOAL #5   Title  Improve FOTO to </= 37% limitation 03/09/2019    Time  12    Period  Weeks    Status  New            Plan - 12/30/18 1744    Clinical Impression Statement  Improved PROM Lt shoulder with better tolerance of PROM/stretching. Continues to have significant capsular/joint tightness with poor idsassociationof scapula and GH joint. PROM Rt shoudler flexion supine - 123 - improving    Stability/Clinical Decision Making  Stable/Uncomplicated    Rehab Potential  Good    PT Frequency  2x / week    PT Duration  6 weeks    PT Treatment/Interventions  Patient/family education;ADLs/Self Care Home Management;Cryotherapy;Electrical Stimulation;Iontophoresis 74m/ml Dexamethasone;Moist Heat;Ultrasound;Manual techniques;Neuromuscular re-education;Functional mobility training;Therapeutic activities;Therapeutic exercise    PT Next Visit Plan  review HEP; progress with PROM; scapular stabilization; continue focus on manual work/PROM/stretching; postural correction; modalities as indicated    PT Home Exercise Plan  [email or web address]    Consulted and Agree with Plan of Care  Patient       Patient will benefit from skilled therapeutic intervention in order to improve the following deficits and impairments:  Pain, Increased fascial restricitons, Increased muscle spasms, Impaired UE functional use, Decreased strength, Decreased mobility, Decreased range of motion, Decreased activity tolerance  Visit Diagnosis: 1. Acute pain of left shoulder   2. Scapular dyskinesis   3. Other symptoms and signs involving the musculoskeletal system        Problem List Patient Active Problem List   Diagnosis Date Noted  . Lumbar degenerative disc disease 10/04/2018  . Pre-operative clearance 08/23/2018  . Acute shoulder bursitis, left 05/26/2018  . Left knee swelling with Baker's cyst 09/30/2017  .  Status post cardiac surgery 09/05/2013  . Osteoporosis 10/10/2010  . BENIGN POSITIONAL  VERTIGO 04/05/2010  . Regional enteritis ([place]) 07/30/2009  . HEADACHE 07/30/2009  . POSTMENOPAUSAL STATUS 04/25/2009  . ANXIETY 09/20/2007  . Insomnia 09/20/2007     [name] PT, MPH  12/30/2018, 5:47 PM  [facility] [place], [place], [address] Phone: [phone]   Fax:  [phone]  Name: Diane Chavez MRN: [id number] Date of Birth: 23-May-1956

## 2019-01-03 ENCOUNTER — Other Ambulatory Visit: Payer: Self-pay

## 2019-01-03 ENCOUNTER — Ambulatory Visit (INDEPENDENT_AMBULATORY_CARE_PROVIDER_SITE_OTHER): Payer: BC Managed Care – PPO | Admitting: Rehabilitative and Restorative Service Providers"

## 2019-01-03 ENCOUNTER — Encounter: Payer: Self-pay | Admitting: Rehabilitative and Restorative Service Providers"

## 2019-01-03 DIAGNOSIS — R29898 Other symptoms and signs involving the musculoskeletal system: Secondary | ICD-10-CM

## 2019-01-03 DIAGNOSIS — G2589 Other specified extrapyramidal and movement disorders: Secondary | ICD-10-CM | POA: Diagnosis not present

## 2019-01-03 DIAGNOSIS — M25512 Pain in left shoulder: Secondary | ICD-10-CM | POA: Diagnosis not present

## 2019-01-03 NOTE — Patient Instructions (Addendum)
[facility]  [phone]  [email or web address]  Access Code: [email or web address]  URL: [email or web address]  Date: 01/03/2019  Prepared by: Gillermo Murdoch   Exercises  Seated Cervical Retraction - 10 reps - 1 sets - 3x daily - 7x weekly  Standing Scapular Retraction - 10 reps - 1 sets - 10 hold - 3x daily - 7x weekly  Elbow AROM Flexion & Extension Supinated Forearm - 5-10 reps - 1 sets - 10 sec hold - 2x daily - 7x weekly  Seated Shoulder Flexion Towel Slide at Table Top - 10 reps - 1 sets - 10 sec hold - 2x daily - 7x weekly  Standing Single Arm Shoulder External Rotation - 3 reps - 1 sets - 30 sec hold - 2x daily - 7x weekly  Circular Shoulder Pendulum with Table Support - 30-35 reps - 1 sets - 2x daily - 7x weekly  Standing Backward Shoulder Rolls - 10-15 reps - 1 sets - 2x daily - 7x weekly  Standing Shoulder Extension ROM with Dowel - 5-10 reps - 1 sets - 10 sec hold - 2x daily - 7x weekly  Standing Shoulder Internal Rotation Stretch with Towel - 5-10 reps - 1 sets - 10 sec hold - 2x daily - 7x weekly  Seated Shoulder Flexion AAROM with Pulley Behind - 3 reps - 1 sets - 30 sec hold - 2x daily - 7x weekly  Seated Shoulder Scaption AAROM with Pulley at Side - 10 reps - 1 sets - 10 sec hold - 2x daily - 7x weekly  Seated Shoulder Scaption AAROM with Pulley at Side - 10 reps - 1 sets - 10 sec hold - 2x daily - 7x weekly

## 2019-01-03 NOTE — Therapy (Signed)
[place], [place], [address] Phone: [phone]   Fax:  [phone]  Physical Therapy Treatment  Patient Details  Name: Diane Chavez MRN: [id number] Date of Birth: [birth year] Referring Provider (PT): Dr Ophelia Charter    Encounter Date: 01/03/2019  PT End of Session - 01/03/19 1605    Visit Number  6    Number of Visits  24    Date for PT Re-Evaluation  03/09/19    PT Start Time  1559    PT Stop Time  1646    PT Time Calculation (min)  47 min    Activity Tolerance  Patient tolerated treatment well       Past Medical History:  Diagnosis Date  . Anxiety   . Crohn disease ([place])    Dr. Laverta Baltimore  . Depression    on prozac previously  . Hyperlipidemia   . Miscarriage     Past Surgical History:  Procedure Laterality Date  . CARDIAC SURGERY     as a child  . EYE SURGERY  10/30/2009   repair of detached retina  . TONSILLECTOMY  1965    There were no vitals filed for this visit.  Subjective Assessment - 01/03/19 1605    Subjective  Increased pain in shoulder Sunday - not sure why. Workingon her exercises and thinks she is increasing her ROM    Currently in Pain?  No/denies         [facility] PT Assessment - 01/03/19 0001      Assessment   Medical Diagnosis  Lt shoulder biceps tendon repair; SAD; DCR     Referring Provider (PT)  Dr Ophelia Charter     Onset Date/Surgical Date  11/29/18   pain since fall of 2019    Hand Dominance  Left    Next MD Visit  01/11/2019    Prior Therapy  yes 7 visits here for shd pain 12/19       PROM   Left Shoulder Flexion  129 Degrees    Left Shoulder External Rotation  71 Degrees   supine Lt UE in scaption elbow ~ 90 deg flexion     Palpation   Palpation comment  muscular tightness Lt upper quarter especially tight through the teres/lats/pecs/upper trap                    [facility] Adult PT Treatment/Exercise - 01/03/19 0001      Shoulder Exercises:  Stretch   Internal Rotation Stretch  5 reps   standing with cane and with strap    External Rotation Stretch  10 seconds;5 reps   standing noodle along spine elbows 90 deg using cane Rt UE   Table Stretch - Flexion  5 reps;10 seconds    Other Shoulder Stretches  shoulder extension with cane - 10 sec x 5 reps     External Rotation Stretch Limitations  hands behind head dropping elbow down toward table 20 sec x 3 PT assist       Manual Therapy   Manual therapy comments  pt supine    Joint Mobilization  Lt shoulder PA and AP and inferior glide; gentle GH movement     Soft tissue mobilization  deep tissue mobilization through upper trap ad posterior shoulder girdle/scapular area into the pecs; biceps; deltoid; teres Lt upper quarter pt sitting and supine     Myofascial Release  posterior scapular and upper trap area  Scapular Mobilization  Lt     Passive ROM  Lt shoulder flexion, scaption, and ER; extension; horizontal abduction              PT Education - 01/03/19 1626    Education Details  HEP pulley    Person(s) Educated  Patient    Methods  Explanation;Demonstration;Tactile cues;Verbal cues;Handout    Comprehension  Verbalized understanding;Returned demonstration;Verbal cues required;Tactile cues required       PT Short Term Goals - 12/15/18 1557      PT SHORT TERM GOAL #1   Title  Independent in initial HEP 01/25/2019    Time  6    Period  Weeks    Status  New      PT SHORT TERM GOAL #2   Title  patient reports sleeping without awakening due to shoulder pain 01/25/2019    Time  6    Period  Weeks    Status  New        PT Long Term Goals - 12/15/18 1553      PT LONG TERM GOAL #1   Title  Improve posture and alignment with patient to demonstrate improved upright posture with posterior shoulder girdle engaged 03/09/2019    Time  12    Period  Weeks    Status  New      PT LONG TERM GOAL #2   Title  Increase AROM Lt shoulder to equal Rt shoulder 03/09/2019     Time  12    Period  Weeks    Status  New      PT LONG TERM GOAL #3   Title  Decrease pain with patient to report ability to reach up and behind back with minimal to no pain 03/09/2019    Time  12    Period  Weeks      PT LONG TERM GOAL #4   Title  Independent in HEP 03/09/2019    Time  12    Period  Weeks    Status  New      PT LONG TERM GOAL #5   Title  Improve FOTO to </= 37% limitation 03/09/2019    Time  12    Period  Weeks    Status  New            Plan - 01/03/19 1605    Clinical Impression Statement  Gradual improvement in Lt shoulder ROM; persistent scapular dyskinesis and poor movement patterns as well as muscular tightness Lt upper quarter. Suggested pt purchase a pulley for home use.    Stability/Clinical Decision Making  Stable/Uncomplicated    Rehab Potential  Good    PT Frequency  2x / week    PT Duration  6 weeks    PT Treatment/Interventions  Patient/family education;ADLs/Self Care Home Management;Cryotherapy;Electrical Stimulation;Iontophoresis 46m/ml Dexamethasone;Moist Heat;Ultrasound;Manual techniques;Neuromuscular re-education;Functional mobility training;Therapeutic activities;Therapeutic exercise    PT Next Visit Plan  review HEP; progress with PROM; scapular stabilization; continue focus on manual work/PROM/stretching; postural correction; modalities as indicated Note to MD next Monday 7/27 for MD appt Tuesday 01/09/2019    PT Home Exercise Plan  [email or web address]    Consulted and Agree with Plan of Care  Patient       Patient will benefit from skilled therapeutic intervention in order to improve the following deficits and impairments:  Pain, Increased fascial restricitons, Increased muscle spasms, Impaired UE functional use, Decreased strength, Decreased mobility, Decreased range of motion, Decreased activity tolerance  Visit Diagnosis: 1.  Acute pain of left shoulder   2. Scapular dyskinesis   3. Other symptoms and signs involving the musculoskeletal system         Problem List Patient Active Problem List   Diagnosis Date Noted  . Lumbar degenerative disc disease 10/04/2018  . Pre-operative clearance 08/23/2018  . Acute shoulder bursitis, left 05/26/2018  . Left knee swelling with Baker's cyst 09/30/2017  . Status post cardiac surgery 09/05/2013  . Osteoporosis 10/10/2010  . BENIGN POSITIONAL VERTIGO 04/05/2010  . Regional enteritis ([place]) 07/30/2009  . HEADACHE 07/30/2009  . POSTMENOPAUSAL STATUS 04/25/2009  . ANXIETY 09/20/2007  . Insomnia 09/20/2007    Marvel[place] [name] PT, MPH  01/03/2019, 5:14 PM  [facility] [place], [place], [address] Phone: [phone]   Fax:  [phone]  Name: AUDRINA MARTEN MRN: [id number] Date of Birth: Jun 16, 1956

## 2019-01-06 ENCOUNTER — Encounter: Payer: Self-pay | Admitting: Rehabilitative and Restorative Service Providers"

## 2019-01-06 ENCOUNTER — Ambulatory Visit (INDEPENDENT_AMBULATORY_CARE_PROVIDER_SITE_OTHER): Payer: BC Managed Care – PPO | Admitting: Rehabilitative and Restorative Service Providers"

## 2019-01-06 ENCOUNTER — Other Ambulatory Visit: Payer: Self-pay

## 2019-01-06 DIAGNOSIS — G2589 Other specified extrapyramidal and movement disorders: Secondary | ICD-10-CM

## 2019-01-06 DIAGNOSIS — R29898 Other symptoms and signs involving the musculoskeletal system: Secondary | ICD-10-CM | POA: Diagnosis not present

## 2019-01-06 DIAGNOSIS — M25512 Pain in left shoulder: Secondary | ICD-10-CM

## 2019-01-06 NOTE — Therapy (Signed)
[place], [place], [address] Phone: [phone]   Fax:  [phone]  Physical Therapy Treatment  Patient Details  Name: Diane Chavez MRN: [id number] Date of Birth: [birth year] Referring Provider (PT): Dr Ophelia Charter    Encounter Date: 01/06/2019  PT End of Session - 01/06/19 1711    Visit Number  7    Number of Visits  24    Date for PT Re-Evaluation  03/09/19    PT Start Time  1625    PT Stop Time  1712   moist heat end of treatment   PT Time Calculation (min)  47 min    Activity Tolerance  Patient tolerated treatment well       Past Medical History:  Diagnosis Date  . Anxiety   . Crohn disease ([place])    Dr. Laverta Baltimore  . Depression    on prozac previously  . Hyperlipidemia   . Miscarriage     Past Surgical History:  Procedure Laterality Date  . CARDIAC SURGERY     as a child  . EYE SURGERY  10/30/2009   repair of detached retina  . TONSILLECTOMY  1965    There were no vitals filed for this visit.  Subjective Assessment - 01/06/19 1715    Subjective  Increased pain in the shoulder today. Not sure of anything she did to irritate shoulder. She did get a pulley for home but has not overdone it with the pulley. Shoulder feels better after today's treatment. Thinks she will take her muscle relaxant tonight.It helps but makes her groggy the next day so she does not like to take it and work.    Currently in Pain?  Yes    Pain Score  5     Pain Location  Shoulder    Pain Orientation  Left    Pain Descriptors / Indicators  Aching;Tightness    Pain Type  Surgical pain;Chronic pain                       [facility] Adult PT Treatment/Exercise - 01/06/19 0001      Shoulder Exercises: Pulleys   Flexion  --   10 sec hold x 5 reps    Scaption  --   10 sec hold x 5 reps      Moist Heat Therapy   Number Minutes Moist Heat  15 Minutes    Moist Heat Location  Shoulder   Lt anterior  and posterior shoulder girdle      Manual Therapy   Manual therapy comments  pt supine    Joint Mobilization  Lt shoulder PA and AP and inferior glide; gentle GH movement     Soft tissue mobilization  deep tissue mobilization through upper trap ad posterior shoulder girdle/scapular area into the pecs; biceps; deltoid; teres Lt upper quarter pt sitting and supine     Myofascial Release  posterior scapular and upper trap area     Scapular Mobilization  Lt     Passive ROM  Lt shoulder flexion, scaption, and ER; extension; horizontal abduction                PT Short Term Goals - 12/15/18 1557      PT SHORT TERM GOAL #1   Title  Independent in initial HEP 01/25/2019    Time  6    Period  Weeks    Status  New  PT SHORT TERM GOAL #2   Title  patient reports sleeping without awakening due to shoulder pain 01/25/2019    Time  6    Period  Weeks    Status  New        PT Long Term Goals - 12/15/18 1553      PT LONG TERM GOAL #1   Title  Improve posture and alignment with patient to demonstrate improved upright posture with posterior shoulder girdle engaged 03/09/2019    Time  12    Period  Weeks    Status  New      PT LONG TERM GOAL #2   Title  Increase AROM Lt shoulder to equal Rt shoulder 03/09/2019    Time  12    Period  Weeks    Status  New      PT LONG TERM GOAL #3   Title  Decrease pain with patient to report ability to reach up and behind back with minimal to no pain 03/09/2019    Time  12    Period  Weeks      PT LONG TERM GOAL #4   Title  Independent in HEP 03/09/2019    Time  12    Period  Weeks    Status  New      PT LONG TERM GOAL #5   Title  Improve FOTO to </= 37% limitation 03/09/2019    Time  12    Period  Weeks    Status  New            Plan - 01/06/19 1713    Clinical Impression Statement  Increased pain and discomfort today. Pt is unsure of anything she did to irritate the shoulder. No new exercises or activities today but pat  tolerated manual work and PROM/stretching with no loss of motion noted. She will try to take her muscle relaxant and use the TENS unit at home to address increased pain. Measurements and note to MD at next visit.    Stability/Clinical Decision Making  Stable/Uncomplicated    Rehab Potential  Good    PT Frequency  2x / week    PT Duration  6 weeks    PT Treatment/Interventions  Patient/family education;ADLs/Self Care Home Management;Cryotherapy;Electrical Stimulation;Iontophoresis 47m/ml Dexamethasone;Moist Heat;Ultrasound;Manual techniques;Neuromuscular re-education;Functional mobility training;Therapeutic activities;Therapeutic exercise    PT Next Visit Plan  review HEP; progress with PROM; scapular stabilization; continue focus on manual work/PROM/stretching; postural correction; modalities as indicated Note to MD Monday 7/27 for MD appt Tuesday 01/09/2019    PT Home Exercise Plan  [email or web address]    Consulted and Agree with Plan of Care  Patient       Patient will benefit from skilled therapeutic intervention in order to improve the following deficits and impairments:  Pain, Increased fascial restricitons, Increased muscle spasms, Impaired UE functional use, Decreased strength, Decreased mobility, Decreased range of motion, Decreased activity tolerance  Visit Diagnosis: 1. Acute pain of left shoulder   2. Scapular dyskinesis   3. Other symptoms and signs involving the musculoskeletal system        Problem List Patient Active Problem List   Diagnosis Date Noted  . Lumbar degenerative disc disease 10/04/2018  . Pre-operative clearance 08/23/2018  . Acute shoulder bursitis, left 05/26/2018  . Left knee swelling with Baker's cyst 09/30/2017  . Status post cardiac surgery 09/05/2013  . Osteoporosis 10/10/2010  . BENIGN POSITIONAL VERTIGO 04/05/2010  . [place] ([place] 07/30/2009  . HEADACHE 07/30/2009  .  POSTMENOPAUSAL STATUS 04/25/2009  . ANXIETY 09/20/2007  . Insomnia 09/20/2007     [place] [name] PT, MPH  01/06/2019, 5:26 PM  [facility] [place], [place], [address] Phone: [phone]   Fax:  [phone]  Name: Diane Chavez MRN: [id number] Date of Birth: [birth year]

## 2019-01-10 ENCOUNTER — Encounter: Payer: Self-pay | Admitting: Rehabilitative and Restorative Service Providers"

## 2019-01-10 ENCOUNTER — Other Ambulatory Visit: Payer: Self-pay

## 2019-01-10 ENCOUNTER — Ambulatory Visit (INDEPENDENT_AMBULATORY_CARE_PROVIDER_SITE_OTHER): Payer: BC Managed Care – PPO | Admitting: Rehabilitative and Restorative Service Providers"

## 2019-01-10 DIAGNOSIS — R29898 Other symptoms and signs involving the musculoskeletal system: Secondary | ICD-10-CM | POA: Diagnosis not present

## 2019-01-10 DIAGNOSIS — G2589 Other specified extrapyramidal and movement disorders: Secondary | ICD-10-CM

## 2019-01-10 DIAGNOSIS — M25512 Pain in left shoulder: Secondary | ICD-10-CM | POA: Diagnosis not present

## 2019-01-10 NOTE — Therapy (Signed)
[place], [place], [address] Phone: [phone]   Fax:  [phone]  Physical Therapy Treatment  Patient Details  Name: Diane Chavez MRN: [id number] Date of Birth: Apr 10, 1956 Referring Provider (PT): Dr Ophelia Charter    Encounter Date: 01/10/2019  PT End of Session - 01/10/19 1551    Visit Number  8    Number of Visits  24    Date for PT Re-Evaluation  03/09/19    PT Start Time  9381    PT Stop Time  1645   moist heat end of treatment   PT Time Calculation (min)  54 min    Activity Tolerance  Patient tolerated treatment well       Past Medical History:  Diagnosis Date  . Anxiety   . Crohn disease ([place])    Dr. Laverta Baltimore  . Depression    on prozac previously  . Hyperlipidemia   . Miscarriage     Past Surgical History:  Procedure Laterality Date  . CARDIAC SURGERY     as a child  . EYE SURGERY  10/30/2009   repair of detached retina  . TONSILLECTOMY  1965    There were no vitals filed for this visit.  Subjective Assessment - 01/10/19 1552    Subjective  Shoulder still hurts - sometimes worse than others. Slept better and had less pain over the weekend with use of muscle relaxant. Had increased pain at work today - put the sling back on for part of the day.    Currently in Pain?  Yes    Pain Score  4     Pain Orientation  Left    Pain Descriptors / Indicators  Aching;Tightness    Pain Type  Surgical pain;Chronic pain    Pain Onset  More than a month ago    Pain Frequency  Intermittent    Aggravating Factors   movement, end of the day    Pain Relieving Factors  ice; medication         [facility] PT Assessment - 01/10/19 0001      Assessment   Medical Diagnosis  Lt shoulder biceps tendon repair; SAD; DCR     Referring Provider (PT)  Dr Ophelia Charter     Onset Date/Surgical Date  11/29/18   pain since fall of 2019    Hand Dominance  Left    Next MD Visit  01/11/2019    Prior Therapy   yes 7 visits here for shd pain 12/19       AROM   Right Shoulder Extension  58 Degrees    Right Shoulder Flexion  173 Degrees    Right Shoulder ABduction  164 Degrees    Right Shoulder Internal Rotation  25 Degrees    Right Shoulder External Rotation  114 Degrees    Left Shoulder Extension  33 Degrees    Left Shoulder Flexion  100 Degrees   scapular dyskinesis - patterns of substittution    Left Shoulder ABduction  98 Degrees   scapular plane/scapular dyskinesis patterns of substitution   Left Shoulder Internal Rotation  40 Degrees   hand to waist    Left Shoulder External Rotation  40 Degrees    Cervical Flexion  42    Cervical Extension  34    Cervical - Right Side Bend  39    Cervical - Left Side Bend  34    Cervical - Right Rotation  65  Cervical - Left Rotation  65      PROM   Right/Left Shoulder  --   PROM assessed with pt in supine    Left Shoulder Extension  57 Degrees    Left Shoulder Flexion  129 Degrees    Left Shoulder ABduction  141 Degrees   scaption   Left Shoulder Internal Rotation  62 Degrees    Left Shoulder External Rotation  68 Degrees   in scaption      Palpation   Palpation comment  muscular tightness Lt upper quarter especially tight through the teres/lats/pecs/upper trap                    [facility] Adult PT Treatment/Exercise - 01/10/19 0001      Shoulder Exercises: Supine   Flexion  AAROM;Left;5 reps   assist with Rt UE    Other Supine Exercises  Rt arm assisting Lt hand to forehead x 5 reps     Other Supine Exercises  PT assisting with to maintain Rt UE iin supported shd flexion with elbow flexed - pt preforming trunk rotation to the Rt x 4 reps at 30-45 sec hold       Shoulder Exercises: Standing   Other Standing Exercises  scap squeeze with noodle 10 sec x 10       Shoulder Exercises: Pulleys   Flexion  --   10 sec hold x 5 reps    Scaption  --   10 sec hold x 5 reps      Shoulder Exercises: Stretch   External Rotation  Stretch  10 seconds;5 reps   standing elbow at 90 deg flexion UE in neutral    Table Stretch - Flexion  5 reps;10 seconds    Other Shoulder Stretches  shoulder extension with cane - 10 sec x 5 reps       Moist Heat Therapy   Number Minutes Moist Heat  14 Minutes    Moist Heat Location  Shoulder   Lt anterior and posterior shoulder girdle      Manual Therapy   Manual therapy comments  pt supine    Joint Mobilization  Lt shoulder PA and AP and inferior glide; gentle GH movement     Soft tissue mobilization  deep tissue mobilization through upper trap ad posterior shoulder girdle/scapular area into the pecs; biceps; deltoid; teres Lt upper quarter pt sitting and supine     Myofascial Release  posterior scapular and upper trap area     Scapular Mobilization  Lt     Passive ROM  Lt shoulder flexion, scaption, and ER; extension; horizontal abduction                PT Short Term Goals - 12/15/18 1557      PT SHORT TERM GOAL #1   Title  Independent in initial HEP 01/25/2019    Time  6    Period  Weeks    Status  New      PT SHORT TERM GOAL #2   Title  patient reports sleeping without awakening due to shoulder pain 01/25/2019    Time  6    Period  Weeks    Status  New        PT Long Term Goals - 12/15/18 1553      PT LONG TERM GOAL #1   Title  Improve posture and alignment with patient to demonstrate improved upright posture with posterior shoulder girdle engaged 03/09/2019  Time  12    Period  Weeks    Status  New      PT LONG TERM GOAL #2   Title  Increase AROM Lt shoulder to equal Rt shoulder 03/09/2019    Time  12    Period  Weeks    Status  New      PT LONG TERM GOAL #3   Title  Decrease pain with patient to report ability to reach up and behind back with minimal to no pain 03/09/2019    Time  12    Period  Weeks      PT LONG TERM GOAL #4   Title  Independent in HEP 03/09/2019    Time  12    Period  Weeks    Status  New      PT LONG TERM GOAL #5   Title   Improve FOTO to </= 37% limitation 03/09/2019    Time  12    Period  Weeks    Status  New            Plan - 01/10/19 1551    Clinical Impression Statement  Persistent intermittent pain in Lt shoulder; difficulty with ROM exercises and tolerance for PROM. Patient demonstrates scapular dyskinesis in standing and with elevation of Lt shoulder.She has tight end feel with PROM indicating capsular tightness. She has done some posterior shoulder girdle strenghtening but no cuff strengthening. Diane Chavez will benefit from continued treatment to progress with ROM; strength and functioin Lt shoulder girdle; GH joint and UE.    Stability/Clinical Decision Making  Stable/Uncomplicated    Rehab Potential  Good    PT Frequency  2x / week    PT Duration  6 weeks    PT Treatment/Interventions  Patient/family education;ADLs/Self Care Home Management;Cryotherapy;Electrical Stimulation;Iontophoresis 82m/ml Dexamethasone;Moist Heat;Ultrasound;Manual techniques;Neuromuscular re-education;Functional mobility training;Therapeutic activities;Therapeutic exercise    PT Next Visit Plan  review HEP; progress with PROM; scapular stabilization; continue focus on manual work/PROM/stretching; postural correction; modalities as indicated Note to MD for MD appt Tuesday 01/09/2019    PT Home Exercise Plan  [email or web address]    Consulted and Agree with Plan of Care  Patient       Patient will benefit from skilled therapeutic intervention in order to improve the following deficits and impairments:  Pain, Increased fascial restricitons, Increased muscle spasms, Impaired UE functional use, Decreased strength, Decreased mobility, Decreased range of motion, Decreased activity tolerance  Visit Diagnosis: 1. Acute pain of left shoulder   2. Scapular dyskinesis   3. Other symptoms and signs involving the musculoskeletal system        Problem List Patient Active Problem List   Diagnosis Date Noted  . Lumbar degenerative disc disease  10/04/2018  . Pre-operative clearance 08/23/2018  . Acute shoulder bursitis, left 05/26/2018  . Left knee swelling with Baker's cyst 09/30/2017  . Status post cardiac surgery 09/05/2013  . Osteoporosis 10/10/2010  . BENIGN POSITIONAL VERTIGO 04/05/2010  . [place] 07/[place]  . HEADACHE 07/30/2009  . POSTMENOPAUSAL STATUS 04/25/2009  . ANXIETY 09/20/2007  . Insomnia 09/20/2007    Diane Chavez PNilda SimmerPT, MPH  01/10/2019, 4:42 PM  [facility] [address]Phone: [phone]  Fax:  [phone] Name: Diane MACMULLENMRN: [id number] of Birth: 3July 14, 1957

## 2019-01-11 DIAGNOSIS — M19012 Primary osteoarthritis, left shoulder: Secondary | ICD-10-CM | POA: Diagnosis not present

## 2019-01-13 ENCOUNTER — Encounter: Payer: Self-pay | Admitting: Rehabilitative and Restorative Service Providers"

## 2019-01-13 ENCOUNTER — Ambulatory Visit (INDEPENDENT_AMBULATORY_CARE_PROVIDER_SITE_OTHER): Payer: BC Managed Care – PPO | Admitting: Rehabilitative and Restorative Service Providers"

## 2019-01-13 ENCOUNTER — Other Ambulatory Visit: Payer: Self-pay

## 2019-01-13 DIAGNOSIS — G2589 Other specified extrapyramidal and movement disorders: Secondary | ICD-10-CM | POA: Diagnosis not present

## 2019-01-13 DIAGNOSIS — R29898 Other symptoms and signs involving the musculoskeletal system: Secondary | ICD-10-CM

## 2019-01-13 DIAGNOSIS — M25512 Pain in left shoulder: Secondary | ICD-10-CM | POA: Diagnosis not present

## 2019-01-13 NOTE — Therapy (Signed)
[place], [place], [address] Phone: [phone]   Fax:  [phone]  Physical Therapy Treatment  Patient Details  Name: Diane Chavez MRN: [id number] Date of Birth: 11/30/1955 Referring Provider (PT): Dr Ophelia Charter    Encounter Date: 01/13/2019  PT End of Session - 01/13/19 1602    Visit Number  9    Number of Visits  24    Date for PT Re-Evaluation  03/09/19    PT Start Time  1600    PT Stop Time  1650    PT Time Calculation (min)  50 min    Activity Tolerance  Patient tolerated treatment well       Past Medical History:  Diagnosis Date  . Anxiety   . Crohn disease ([place])    Dr. Laverta Baltimore  . Depression    on prozac previously  . Hyperlipidemia   . Miscarriage     Past Surgical History:  Procedure Laterality Date  . CARDIAC SURGERY     as a child  . EYE SURGERY  10/30/2009   repair of detached retina  . TONSILLECTOMY  1965    There were no vitals filed for this visit.  Subjective Assessment - 01/13/19 1603    Subjective  Patient reports that she was seen by surgeon this week and he feels she is doing OK. He gave her a cortisone injection which has helped with the pain in the back of the shoulder but not the pain in the front. She thinks she can move her arm in ways she couldn't before the injection. She slept well last night.    Currently in Pain?  No/denies    Pain Score  0-No pain                       [facility] Adult PT Treatment/Exercise - 01/13/19 0001      Shoulder Exercises: Supine   External Rotation  AAROM;Left;5 reps   hand behind head    Flexion  AAROM;Left;5 reps   assist with Rt UE      Shoulder Exercises: Seated   Other Seated Exercises  PT assist GH joint distraction using pillow case for joint mobilization 2-3 min       Shoulder Exercises: Standing   Extension  Strengthening;Both;10 reps;Theraband    Theraband Level (Shoulder Extension)  Level 2  (Red)    Row  Strengthening;Both;10 reps;Theraband    Theraband Level (Shoulder Row)  Level 2 (Red)    Retraction  Strengthening;Both;10 reps;Theraband    Theraband Level (Shoulder Retraction)  Level 2 (Red)    Other Standing Exercises  scap squeeze with noodle 10 sec x 10       Shoulder Exercises: Pulleys   Flexion  --   10 sec hold x 5 reps    Scaption  --   10 sec hold x 5 reps    Other Pulley Exercises  ER holding pulley at head height bilat x 10-15 reps       Moist Heat Therapy   Number Minutes Moist Heat  15 Minutes   5 min during pulley; 10 min post treatment    Moist Heat Location  Shoulder   Lt anterior and posterior shoulder girdle      Manual Therapy   Manual therapy comments  pt supine    Joint Mobilization  Lt shoulder PA and AP and inferior glide; GH mobs in varying planes of motion  Soft tissue mobilization  deep tissue mobilization through upper trap ad posterior shoulder girdle/scapular area into the pecs; biceps; deltoid; teres Lt upper quarter pt sitting and supine     Myofascial Release  posterior scapular and upper trap area     Scapular Mobilization  Lt     Passive ROM  Lt shoulder flexion, scaption, and ER; extension; horizontal abduction              PT Education - 01/13/19 1614    Education Details  HEP    Person(s) Educated  Patient    Methods  Explanation;Demonstration;Tactile cues;Verbal cues;Handout    Comprehension  Verbalized understanding;Returned demonstration;Verbal cues required;Tactile cues required       PT Short Term Goals - 12/15/18 1557      PT SHORT TERM GOAL #1   Title  Independent in initial HEP 01/25/2019    Time  6    Period  Weeks    Status  New      PT SHORT TERM GOAL #2   Title  patient reports sleeping without awakening due to shoulder pain 01/25/2019    Time  6    Period  Weeks    Status  New        PT Long Term Goals - 12/15/18 1553      PT LONG TERM GOAL #1   Title  Improve posture and alignment with  patient to demonstrate improved upright posture with posterior shoulder girdle engaged 03/09/2019    Time  12    Period  Weeks    Status  New      PT LONG TERM GOAL #2   Title  Increase AROM Lt shoulder to equal Rt shoulder 03/09/2019    Time  12    Period  Weeks    Status  New      PT LONG TERM GOAL #3   Title  Decrease pain with patient to report ability to reach up and behind back with minimal to no pain 03/09/2019    Time  12    Period  Weeks      PT LONG TERM GOAL #4   Title  Independent in HEP 03/09/2019    Time  12    Period  Weeks    Status  New      PT LONG TERM GOAL #5   Title  Improve FOTO to </= 37% limitation 03/09/2019    Time  12    Period  Weeks    Status  New            Plan - 01/13/19 1603    Clinical Impression Statement  Continued tightness and pain in the Lt shoulder. Pt received an injection which has helped with the pain some. She continues to have joint limitations and tightness.    Stability/Clinical Decision Making  Stable/Uncomplicated    Rehab Potential  Good    PT Frequency  2x / week    PT Duration  6 weeks    PT Treatment/Interventions  Patient/family education;ADLs/Self Care Home Management;Cryotherapy;Electrical Stimulation;Iontophoresis 72m/ml Dexamethasone;Moist Heat;Ultrasound;Manual techniques;Neuromuscular re-education;Functional mobility training;Therapeutic activities;Therapeutic exercise    PT Next Visit Plan  review HEP; progress with PROM; scapular stabilization; continue focus on manual work/PROM/stretching; postural correction; modalities as indicated Note to MD for MD appt Tuesday 01/09/2019    PT [place] Agree with Plan of Care  Patient       Patient will benefit from  skilled therapeutic intervention in order to improve the following deficits and impairments:  Pain, Increased fascial restricitons, Increased muscle spasms, Impaired UE functional use, Decreased strength, Decreased mobility,  Decreased range of motion, Decreased activity tolerance  Visit Diagnosis: 1. Acute pain of left shoulder   2. Scapular dyskinesis   3. Other symptoms and signs involving the musculoskeletal system        Problem List Patient Active Problem List   Diagnosis Date Noted  . Lumbar degenerative disc disease 10/04/2018  . Pre-operative clearance 08/23/2018  . Acute shoulder bursitis, left 05/26/2018  . Left knee swelling with Baker's cyst 09/30/2017  . Status post cardiac surgery 09/05/2013  . Osteoporosis 10/10/2010  . BENIGN POSITIONAL VERTIGO 04/05/2010  . Regional enteritis ([place]) 07/30/2009  . HEADACHE 07/30/2009  . POSTMENOPAUSAL STATUS 04/25/2009  . ANXIETY 09/20/2007  . Insomnia 09/20/2007     [name] PT, MPH  01/13/2019, 4:41 PM  [facility] [place], [place], [address] Phone: [phone]   Fax:  [phone]  Name: Diane Chavez MRN: [id number] Date of Birth: [birth year]

## 2019-01-13 NOTE — Patient Instructions (Signed)
Access Code: [email or web address]  URL: [email or web address]  Date: 01/13/2019  Prepared by: Gillermo Murdoch   Exercises  Seated Cervical Retraction - 10 reps - 1 sets - 3x daily - 7x weekly  Standing Scapular Retraction - 10 reps - 1 sets - 10 hold - 3x daily - 7x weekly  Elbow AROM Flexion & Extension Supinated Forearm - 5-10 reps - 1 sets - 10 sec hold - 2x daily - 7x weekly  Seated Shoulder Flexion Towel Slide at Table Top - 10 reps - 1 sets - 10 sec hold - 2x daily - 7x weekly  Standing Single Arm Shoulder External Rotation - 3 reps - 1 sets - 30 sec hold - 2x daily - 7x weekly  Circular Shoulder Pendulum with Table Support - 30-35 reps - 1 sets - 2x daily - 7x weekly  Standing Backward Shoulder Rolls - 10-15 reps - 1 sets - 2x daily - 7x weekly  Standing Shoulder Extension ROM with Dowel - 5-10 reps - 1 sets - 10 sec hold - 2x daily - 7x weekly  Standing Shoulder Internal Rotation Stretch with Towel - 5-10 reps - 1 sets - 10 sec hold - 2x daily - 7x weekly  Seated Shoulder Flexion AAROM with Pulley Behind - 3 reps - 1 sets - 30 sec hold - 2x daily - 7x weekly  Seated Shoulder Scaption AAROM with Pulley at Side - 10 reps - 1 sets - 10 sec hold - 2x daily - 7x weekly  Seated Shoulder Scaption AAROM with Pulley at Side - 10 reps - 1 sets - 10 sec hold - 2x daily - 7x weekly  Shoulder External Rotation and Scapular Retraction with Resistance - 10 reps - 3 sets - 2x daily - 7x weekly  Scapular Retraction with Resistance - 10 reps - 3 sets - 2x daily - 7x weekly  Scapular Retraction with Resistance Advanced - 10 reps - 3 sets - 2x daily - 7x weekly

## 2019-01-17 ENCOUNTER — Other Ambulatory Visit: Payer: Self-pay

## 2019-01-17 ENCOUNTER — Ambulatory Visit (INDEPENDENT_AMBULATORY_CARE_PROVIDER_SITE_OTHER): Payer: BC Managed Care – PPO | Admitting: Rehabilitative and Restorative Service Providers"

## 2019-01-17 ENCOUNTER — Encounter: Payer: Self-pay | Admitting: Rehabilitative and Restorative Service Providers"

## 2019-01-17 DIAGNOSIS — G2589 Other specified extrapyramidal and movement disorders: Secondary | ICD-10-CM

## 2019-01-17 DIAGNOSIS — R29898 Other symptoms and signs involving the musculoskeletal system: Secondary | ICD-10-CM | POA: Diagnosis not present

## 2019-01-17 DIAGNOSIS — M25512 Pain in left shoulder: Secondary | ICD-10-CM

## 2019-01-17 NOTE — Therapy (Signed)
[place], [place], [address] Phone: [phone]   Fax:  [phone]  Physical Therapy Treatment  Patient Details  Name: Diane Chavez MRN: [id number] Date of Birth: 08-Oct-1955 Referring Provider (PT): Dr Ophelia Charter    Encounter Date: 01/17/2019  PT End of Session - 01/17/19 1559    Visit Number  10    Number of Visits  24    Date for PT Re-Evaluation  03/09/19    PT Start Time  2633    PT Stop Time  3545    PT Time Calculation (min)  48 min    Activity Tolerance  Patient tolerated treatment well       Past Medical History:  Diagnosis Date  . Anxiety   . Crohn disease ([place])    Dr. Laverta Baltimore  . Depression    on prozac previously  . Hyperlipidemia   . Miscarriage     Past Surgical History:  Procedure Laterality Date  . CARDIAC SURGERY     as a child  . EYE SURGERY  10/30/2009   repair of detached retina  . TONSILLECTOMY  1965    There were no vitals filed for this visit.  Subjective Assessment - 01/17/19 1600    Subjective  Seems shoulder is moving some better. She is sleeping better and not taking the muscle relaxant in the past couple of days. Only has pain outside of exercises when reaching for her back pocket    Currently in Pain?  No/denies                       [facility] Adult PT Treatment/Exercise - 01/17/19 0001      Shoulder Exercises: Supine   External Rotation  AAROM;Left;5 reps   hand behind head    Flexion  AAROM;Left;5 reps   assist with Rt UE      Shoulder Exercises: Standing   Other Standing Exercises  yellow theratube for UE work in various shoulder ranges ~ 2 min in several sets       Shoulder Exercises: Pulleys   Flexion  --   10 sec hold x 5 reps    Scaption  --   10 sec hold x 5 reps    Other Pulley Exercises  ER holding pulley at head height bilat x 10-15 reps     Other Pulley Exercises  IR standing 20 sec x 3 reps       Shoulder Exercises:  ROM/Strengthening   Other ROM/Strengthening Exercises  quardraped child's pose 20 sec x 3; thread the needle 20 sec x 3; walking arms to side 20 sec x 3       Shoulder Exercises: Stretch   Corner Stretch  2 reps;30 seconds   3 positions    Wall Stretch - ABduction  2 reps;30 seconds   horizontal abduction stretch T position at wall    Table Stretch - Flexion  5 reps;10 seconds      Moist Heat Therapy   Number Minutes Moist Heat  15 Minutes   during exercise and 8 min post manual work      Manual Therapy   Manual therapy comments  pt supine and sitting     Joint Mobilization  Lt shoulder PA and AP and inferior glide; GH mobs in varying planes of motion     Soft tissue mobilization  deep tissue mobilization through upper trap ad posterior shoulder girdle/scapular area into the  pecs; biceps; deltoid; teres Lt upper quarter pt sitting and supine     Myofascial Release  posterior scapular and upper trap area     Passive ROM  Lt shoulder flexion, scaption, and ER; extension; horizontal abduction              PT Education - 01/17/19 1624    Education Details  HEP    Person(s) Educated  Patient    Methods  Explanation;Demonstration;Tactile cues;Verbal cues;Handout    Comprehension  Verbalized understanding;Returned demonstration;Verbal cues required;Tactile cues required       PT Short Term Goals - 12/15/18 1557      PT SHORT TERM GOAL #1   Title  Independent in initial HEP 01/25/2019    Time  6    Period  Weeks    Status  New      PT SHORT TERM GOAL #2   Title  patient reports sleeping without awakening due to shoulder pain 01/25/2019    Time  6    Period  Weeks    Status  New        PT Long Term Goals - 12/15/18 1553      PT LONG TERM GOAL #1   Title  Improve posture and alignment with patient to demonstrate improved upright posture with posterior shoulder girdle engaged 03/09/2019    Time  12    Period  Weeks    Status  New      PT LONG TERM GOAL #2   Title   Increase AROM Lt shoulder to equal Rt shoulder 03/09/2019    Time  12    Period  Weeks    Status  New      PT LONG TERM GOAL #3   Title  Decrease pain with patient to report ability to reach up and behind back with minimal to no pain 03/09/2019    Time  12    Period  Weeks      PT LONG TERM GOAL #4   Title  Independent in HEP 03/09/2019    Time  12    Period  Weeks    Status  New      PT LONG TERM GOAL #5   Title  Improve FOTO to </= 37% limitation 03/09/2019    Time  12    Period  Weeks    Status  New            Plan - 01/17/19 1600    Clinical Impression Statement  Gradual improvement in mobility and ROM. Progressing with exercise including strengthening and ROM. Pain is improving and pt is sleeping better.    Stability/Clinical Decision Making  Stable/Uncomplicated    Rehab Potential  Good    PT Frequency  2x / week    PT Duration  6 weeks    PT Treatment/Interventions  Patient/family education;ADLs/Self Care Home Management;Cryotherapy;Electrical Stimulation;Iontophoresis 35m/ml Dexamethasone;Moist Heat;Ultrasound;Manual techniques;Neuromuscular re-education;Functional mobility training;Therapeutic activities;Therapeutic exercise    PT Next Visit Plan  review HEP; progress with PROM; scapular stabilization; continue focus on manual work/PROM/stretching; postural correction; modalities as indicated Note to MD for MD appt Tuesday 01/09/2019    PT Home Exercise Plan  [email or web address]    Consulted and Agree with Plan of Care  Patient       Patient will benefit from skilled therapeutic intervention in order to improve the following deficits and impairments:  Pain, Increased fascial restricitons, Increased muscle spasms, Impaired UE functional use, Decreased strength, Decreased mobility, Decreased range of  motion, Decreased activity tolerance  Visit Diagnosis: 1. Acute pain of left shoulder   2. Scapular dyskinesis   3. Other symptoms and signs involving the musculoskeletal system         Problem List Patient Active Problem List   Diagnosis Date Noted  . Lumbar degenerative disc disease 10/04/2018  . Pre-operative clearance 08/23/2018  . Acute shoulder bursitis, left 05/26/2018  . Left knee swelling with Baker's cyst 09/30/2017  . Status post cardiac surgery 09/05/2013  . Osteoporosis 10/10/2010  . BENIGN POSITIONAL VERTIGO 04/05/2010  . Regional enteritis ([place]) 07/30/2009  . HEADACHE 07/30/2009  . POSTMENOPAUSAL STATUS 04/25/2009  . ANXIETY 09/20/2007  . Insomnia 09/20/2007    Kensli Bowley [name] PT, MPH  01/17/2019, 4:55 PM  [facility] [place], [place], [address] Phone: [phone]   Fax:  [phone]  Name: Diane Chavez MRN: [id number] Date of Birth: Mar 19, 1956

## 2019-01-17 NOTE — Patient Instructions (Addendum)
Corner stretch - same positions and hold time and reps as doorway stretch   Access Code: [email or web address]  URL: [email or web address]  Date: 01/17/2019  Prepared by: Gillermo Murdoch   Exercises  Seated Cervical Retraction - 10 reps - 1 sets - 3x daily - 7x weekly  Standing Scapular Retraction - 10 reps - 1 sets - 10 hold - 3x daily - 7x weekly  Elbow AROM Flexion & Extension Supinated Forearm - 5-10 reps - 1 sets - 10 sec hold - 2x daily - 7x weekly  Seated Shoulder Flexion Towel Slide at Table Top - 10 reps - 1 sets - 10 sec hold - 2x daily - 7x weekly  Standing Single Arm Shoulder External Rotation - 3 reps - 1 sets - 30 sec hold - 2x daily - 7x weekly  Circular Shoulder Pendulum with Table Support - 30-35 reps - 1 sets - 2x daily - 7x weekly  Standing Backward Shoulder Rolls - 10-15 reps - 1 sets - 2x daily - 7x weekly  Standing Shoulder Extension ROM with Dowel - 5-10 reps - 1 sets - 10 sec hold - 2x daily - 7x weekly  Standing Shoulder Internal Rotation Stretch with Towel - 5-10 reps - 1 sets - 10 sec hold - 2x daily - 7x weekly  Seated Shoulder Flexion AAROM with Pulley Behind - 3 reps - 1 sets - 30 sec hold - 2x daily - 7x weekly  Seated Shoulder Scaption AAROM with Pulley at Side - 10 reps - 1 sets - 10 sec hold - 2x daily - 7x weekly  Seated Shoulder Scaption AAROM with Pulley at Side - 10 reps - 1 sets - 10 sec hold - 2x daily - 7x weekly  Shoulder External Rotation and Scapular Retraction with Resistance - 10 reps - 3 sets - 2x daily - 7x weekly  Scapular Retraction with Resistance - 10 reps - 3 sets - 2x daily - 7x weekly  Scapular Retraction with Resistance Advanced - 10 reps - 3 sets - 2x daily - 7x weekly  Child's Pose Stretch - 3 reps - 1 sets - 30 sec hold - 2x daily - 7x weekly  Child's Pose with Thread the Needle - 3 reps - 1 sets - 30 sec hold - 2x daily - 7x weekly  Childs Pose Side Bend - 3 reps - 1 sets - 30 sec hold - 2x daily - 7x weekly

## 2019-01-20 ENCOUNTER — Other Ambulatory Visit: Payer: Self-pay

## 2019-01-20 ENCOUNTER — Encounter: Payer: Self-pay | Admitting: Rehabilitative and Restorative Service Providers"

## 2019-01-20 ENCOUNTER — Ambulatory Visit (INDEPENDENT_AMBULATORY_CARE_PROVIDER_SITE_OTHER): Payer: BC Managed Care – PPO | Admitting: Rehabilitative and Restorative Service Providers"

## 2019-01-20 DIAGNOSIS — R29898 Other symptoms and signs involving the musculoskeletal system: Secondary | ICD-10-CM | POA: Diagnosis not present

## 2019-01-20 DIAGNOSIS — G2589 Other specified extrapyramidal and movement disorders: Secondary | ICD-10-CM | POA: Diagnosis not present

## 2019-01-20 DIAGNOSIS — M25512 Pain in left shoulder: Secondary | ICD-10-CM | POA: Diagnosis not present

## 2019-01-20 NOTE — Therapy (Signed)
Diane Chavez Diane Chavez Diane Chavez, [place], [address] Phone: [phone]   Fax:  [phone]  Physical Therapy Treatment  Patient Details  Name: Diane Chavez MRN: [id number] Date of Birth: [birth year] Referring Provider (PT): Dr Ophelia Charter    Encounter Date: 01/20/2019  PT End of Session - 01/20/19 1756    Visit Number  11    Number of Visits  24    Date for PT Re-Evaluation  03/09/19    PT Start Time  8250    PT Stop Time  1749    PT Time Calculation (min)  55 min    Activity Tolerance  Patient tolerated treatment well       Past Medical History:  Diagnosis Date  . Anxiety   . Crohn disease ([place])    Dr. Laverta [place]  . Depression    on prozac previously  . Hyperlipidemia   . Miscarriage     Past Surgical History:  Procedure Laterality Date  . CARDIAC SURGERY     as a child  . EYE SURGERY  10/30/2009   repair of detached retina  . TONSILLECTOMY  1965    There were no vitals filed for this visit.  Subjective Assessment - 01/20/19 1758    Subjective  Thinks shoulder is getting better. Hardest is reaching behind her back    Currently in Pain?  No/denies                       [facility] Adult PT Treatment/Exercise - 01/20/19 0001      Shoulder Exercises: Supine   External Rotation  AAROM;Left;5 reps   hand behind head    Flexion  AAROM;Left;5 reps   assist with Rt UE      Shoulder Exercises: Pulleys   Flexion  --   10 sec hold x 5 reps    Scaption  --   10 sec hold x 5 reps    Other Pulley Exercises  ER holding pulley at head height bilat x 10-15 reps       Shoulder Exercises: Stretch   Corner Stretch  2 reps;30 seconds   3 positions    Internal Rotation Stretch  5 reps   10 sec hold with strap    Wall Stretch - ABduction  2 reps;30 seconds   horizontal abduction stretch T position at wall    Table Stretch - Flexion  5 reps;10 seconds      Moist Heat Therapy   Number Minutes Moist Heat   15 Minutes    Moist Heat Location  Shoulder      Manual Therapy   Manual therapy comments  pt supine and sidelying     Joint Mobilization  Lt shoulder PA and AP and inferior glide; GH mobs in varying planes of motion     Soft tissue mobilization  deep tissue mobilization through upper trap ad posterior shoulder girdle/scapular area into the pecs; biceps; deltoid; teres Lt upper quarter pt sitting and supine     Myofascial Release  posterior scapular and upper trap area     Scapular Mobilization  Lt     Passive ROM  Lt shoulder flexion, scaption, IR;  ER; extension; horizontal abduction                PT Short Term Goals - 12/15/18 1557      PT SHORT TERM GOAL #1   Title  Independent in initial HEP 01/25/2019  Time  6    Period  Weeks    Status  New      PT SHORT TERM GOAL #2   Title  patient reports sleeping without awakening due to shoulder pain 01/25/2019    Time  6    Period  Weeks    Status  New        PT Long Term Goals - 12/15/18 1553      PT LONG TERM GOAL #1   Title  Improve posture and alignment with patient to demonstrate improved upright posture with posterior shoulder girdle engaged 03/09/2019    Time  12    Period  Weeks    Status  New      PT LONG TERM GOAL #2   Title  Increase AROM Lt shoulder to equal Rt shoulder 03/09/2019    Time  12    Period  Weeks    Status  New      PT LONG TERM GOAL #3   Title  Decrease pain with patient to report ability to reach up and behind back with minimal to no pain 03/09/2019    Time  12    Period  Weeks      PT LONG TERM GOAL #4   Title  Independent in HEP 03/09/2019    Time  12    Period  Weeks    Status  New      PT LONG TERM GOAL #5   Title  Improve FOTO to </= 37% limitation 03/09/2019    Time  12    Period  Weeks    Status  New            Plan - 01/20/19 1757    Clinical Impression Statement  Continued improvement in pain and ROM/mobility and function. Remains limited at end ranges -  greatest in IR.    Stability/Clinical Decision Making  Stable/Uncomplicated    Rehab Potential  Good    PT Frequency  2x / week    PT Duration  6 weeks    PT Treatment/Interventions  Patient/family education;ADLs/Self Care Home Management;Cryotherapy;Electrical Stimulation;Iontophoresis 43m/ml Dexamethasone;Moist Heat;Ultrasound;Manual techniques;Neuromuscular re-education;Functional mobility training;Therapeutic activities;Therapeutic exercise    PT Next Visit Plan  review HEP; progress with PROM; scapular stabilization; continue focus on manual work/PROM/stretching; postural correction; modalities as indicated    PT Home Exercise Plan  [email or web address]    Consulted and Agree with Plan of Care  Patient       Patient will benefit from skilled therapeutic intervention in order to improve the following deficits and impairments:  Pain, Increased fascial restricitons, Increased muscle spasms, Impaired UE functional use, Decreased strength, Decreased mobility, Decreased range of motion, Decreased activity tolerance  Visit Diagnosis: 1. Acute pain of left shoulder   2. Scapular dyskinesis   3. Other symptoms and signs involving the musculoskeletal system        Problem List Patient Active Problem List   Diagnosis Date Noted  . Lumbar degenerative disc disease 10/04/2018  . Pre-operative clearance 08/23/2018  . Acute shoulder bursitis, left 05/26/2018  . Left knee swelling with Baker's cyst 09/30/2017  . Status post cardiac surgery 09/05/2013  . Osteoporosis 10/10/2010  . BENIGN POSITIONAL VERTIGO 04/05/2010  . [place] 07/[place]  . HEADACHE 07/30/2009  . POSTMENOPAUSAL STATUS 04/25/2009  . ANXIETY 09/20/2007  . Insomnia 09/20/2007    [name] PNilda SimmerPT, MPH  01/20/2019, 6:02 PM  [facility] [address]  [place], [place], [address] Phone: [phone]   Fax:  [phone]  Name: Diane Chavez MRN: [id number] Date  of Birth: Oct 18, 1955

## 2019-01-24 ENCOUNTER — Other Ambulatory Visit: Payer: Self-pay

## 2019-01-24 ENCOUNTER — Ambulatory Visit (INDEPENDENT_AMBULATORY_CARE_PROVIDER_SITE_OTHER): Payer: BC Managed Care – PPO | Admitting: Rehabilitative and Restorative Service Providers"

## 2019-01-24 ENCOUNTER — Encounter: Payer: Self-pay | Admitting: Rehabilitative and Restorative Service Providers"

## 2019-01-24 DIAGNOSIS — G2589 Other specified extrapyramidal and movement disorders: Secondary | ICD-10-CM

## 2019-01-24 DIAGNOSIS — R29898 Other symptoms and signs involving the musculoskeletal system: Secondary | ICD-10-CM | POA: Diagnosis not present

## 2019-01-24 DIAGNOSIS — M25512 Pain in left shoulder: Secondary | ICD-10-CM

## 2019-01-24 NOTE — Patient Instructions (Signed)

## 2019-01-24 NOTE — Therapy (Signed)
[place], [place], [address] Phone: [phone]   Fax:  [phone]  Physical Therapy Treatment  Patient Details  Name: Diane Chavez MRN: [id number] Date of Birth: 11/19/1955 Referring Provider (PT): Dr Ophelia Charter    Encounter Date: 01/24/2019  PT End of Session - 01/24/19 1600    Visit Number  12    Number of Visits  24    Date for PT Re-Evaluation  03/09/19    PT Start Time  8315    PT Stop Time  1646    PT Time Calculation (min)  49 min    Activity Tolerance  Patient tolerated treatment well       Past Medical History:  Diagnosis Date  . Anxiety   . Crohn disease ([place])    Dr. Laverta Baltimore  . Depression    on prozac previously  . Hyperlipidemia   . Miscarriage     Past Surgical History:  Procedure Laterality Date  . CARDIAC SURGERY     as a child  . EYE SURGERY  10/30/2009   repair of detached retina  . TONSILLECTOMY  1965    There were no vitals filed for this visit.  Subjective Assessment - 01/24/19 1601    Subjective  Working on exercises a lot over the weekend - not as much chance to work on them today. One spot that hurts.in the front top of the shoulder    Currently in Pain?  No/denies                       [facility] Adult PT Treatment/Exercise - 01/24/19 0001      Shoulder Exercises: Prone   Other Prone Exercises  scap squeeze 5 sec x 10; scap squeeze with row position UE's 5 sec x 5; positioned in W for stretch and lift       Shoulder Exercises: Pulleys   Flexion  --   10 sec hold x 5 reps PT holding scap to inc GH mvt     Scaption  --   10 sec hold x 5 reps    Other Pulley Exercises  ER holding pulley at head height bilat x 10-15 reps       Shoulder Exercises: Stretch   Internal Rotation Stretch  5 reps   10 sec hold with strap    Other Shoulder Stretches  doorway stretch 30 sec x 2 each position       Moist Heat Therapy   Number Minutes Moist Heat   15 Minutes    Moist Heat Location  Shoulder      Iontophoresis   Type of Iontophoresis  Dexamethasone    Location  anterior superior Lt shoulder     Dose  120 mAmp/1 cc    Time  12 hours       Manual Therapy   Manual therapy comments  pt supine and prone     Joint Mobilization  Lt shoulder PA and AP and inferior glide; GH mobs in varying planes of motion     Soft tissue mobilization  deep tissue mobilization through periscapula musculature - upper trap ad posterior shoulder girdle/scapular area into the pecs; biceps; deltoid; teres Lt upper quarter pt sitting and supine     Myofascial Release  posterior scapular and upper trap area     Scapular Mobilization  Lt     Passive ROM  Lt shoulder flexion, scaption, IR;  ER; extension;  horizontal abduction              PT Education - 01/24/19 1601    Education Details  [place]    Person(s) Educated  Patient    Methods  Explanation    Comprehension  Verbalized understanding       PT Short Term Goals - 01/24/19 1609      PT SHORT TERM GOAL #1   Title  Independent in initial HEP 01/25/2019    Time  6    Period  Weeks    Status  Achieved      PT SHORT TERM GOAL #2   Title  patient reports sleeping without awakening due to shoulder pain 01/25/2019    Time  6    Status  Achieved        PT Long Term Goals - 12/15/18 1553      PT LONG TERM GOAL #1   Title  Improve posture and alignment with patient to demonstrate improved upright posture with posterior shoulder girdle engaged 03/09/2019    Time  12    Period  Weeks    Status  New      PT LONG TERM GOAL #2   Title  Increase AROM Lt shoulder to equal Rt shoulder 03/09/2019    Time  12    Period  Weeks    Status  New      PT LONG TERM GOAL #3   Title  Decrease pain with patient to report ability to reach up and behind back with minimal to no pain 03/09/2019    Time  12    Period  Weeks      PT LONG TERM GOAL #4   Title  Independent in HEP 03/09/2019    Time  12    Period   Weeks    Status  New      PT LONG TERM GOAL #5   Title  Improve FOTO to </= 37% limitation 03/09/2019    Time  12    Period  Weeks    Status  New            Plan - 01/24/19 1600    Clinical Impression Statement  Improving ROM and functional mobility. continues to demonstrate scapular dyskinesis but scapular stability is improving. Trial of iont to address painful area anterior shoulder.Short term goals accomplished.    Stability/Clinical Decision Making  Stable/Uncomplicated    Rehab Potential  Good    PT Frequency  2x / week    PT Duration  6 weeks    PT Treatment/Interventions  Patient/family education;ADLs/Self Care Home Management;Cryotherapy;Electrical Stimulation;Iontophoresis 39m/ml Dexamethasone;Moist Heat;Ultrasound;Manual techniques;Neuromuscular re-education;Functional mobility training;Therapeutic activities;Therapeutic exercise    PT Next Visit Plan  review HEP; progress with PROM; scapular stabilization; continue focus on manual work/PROM/stretching; postural correction; modalities as indicated; assess trial of ionto    PT Home Exercise Plan  [email or web address]    Consulted and Agree with Plan of Care  Patient       Patient will benefit from skilled therapeutic intervention in order to improve the following deficits and impairments:  Pain, Increased fascial restricitons, Increased muscle spasms, Impaired UE functional use, Decreased strength, Decreased mobility, Decreased range of motion, Decreased activity tolerance  Visit Diagnosis: 1. Acute pain of left shoulder   2. Scapular dyskinesis   3. Other symptoms and signs involving the musculoskeletal system        Problem List Patient Active Problem List   Diagnosis Date Noted  .  Lumbar degenerative disc disease 10/04/2018  . Pre-operative clearance 08/23/2018  . Acute shoulder bursitis, left 05/26/2018  . Left knee swelling with Baker's cyst 09/30/2017  . Status post cardiac surgery 09/05/2013  . Osteoporosis  10/10/2010  . BENIGN POSITIONAL VERTIGO 04/05/2010  . 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 HEADACHE 07/30/2009  . POSTMENOPAUSAL STATUS 04/25/2009  . ANXIETY 09/20/2007  . Insomnia 09/20/2007    Cerrone Debold [name] PT, MPH  01/24/2019, 4:55 PM  [facility] [address] [place], [place], [address] Phone: [phone]   Fax:  [phone]  Name: Diane Chavez MRN: [id number] Date of Birth: March 07, 1956

## 2019-01-27 ENCOUNTER — Ambulatory Visit (INDEPENDENT_AMBULATORY_CARE_PROVIDER_SITE_OTHER): Payer: BC Managed Care – PPO | Admitting: Physical Therapy

## 2019-01-27 ENCOUNTER — Other Ambulatory Visit: Payer: Self-pay

## 2019-01-27 DIAGNOSIS — M25512 Pain in left shoulder: Secondary | ICD-10-CM | POA: Diagnosis not present

## 2019-01-27 DIAGNOSIS — G2589 Other specified extrapyramidal and movement disorders: Secondary | ICD-10-CM | POA: Diagnosis not present

## 2019-01-27 DIAGNOSIS — R29898 Other symptoms and signs involving the musculoskeletal system: Secondary | ICD-10-CM

## 2019-01-27 NOTE — Patient Instructions (Signed)
Resisted External Rotation: in Neutral - Bilateral  PALMS UP!!! Sit or stand, tubing in both hands, elbows at sides, bent to 90, forearms forward. Pinch shoulder blades together and rotate forearms out. Keep elbows at sides. Repeat __10__ times per set. Do __2-3__ sets per session. Do __3-4__ sessions per week.  Do this in front of mirror to avoid substitution.

## 2019-01-27 NOTE — Therapy (Signed)
[place], [place], [address] Phone: [phone]   Fax:  [phone]  Physical Therapy Treatment  Patient Details  Name: Diane Chavez MRN: [id number] Date of Birth: [birth year] Referring Provider (PT): Dr Ophelia Charter    Encounter Date: 01/27/2019  PT End of Session - 01/27/19 1657    Visit Number  13    Number of Visits  24    Date for PT Re-Evaluation  03/09/19    PT Start Time  2671    PT Stop Time  1748    PT Time Calculation (min)  55 min    Activity Tolerance  Patient tolerated treatment well    Behavior During Therapy  [facility] for tasks assessed/performed       Past Medical History:  Diagnosis Date  . Anxiety   . Crohn disease ([place])    Dr. Laverta Baltimore  . Depression    on prozac previously  . Hyperlipidemia   . Miscarriage     Past Surgical History:  Procedure Laterality Date  . CARDIAC SURGERY     as a child  . EYE SURGERY  10/30/2009   repair of detached retina  . TONSILLECTOMY  1965    There were no vitals filed for this visit.  Subjective Assessment - 01/27/19 1657    Subjective  Pt reports she had no pain in shoulder the day she had ionto patch on.  She's unsure if it helped much.  She still has difficulty reaching behind back.  Her sleep has improved, where she is not waking due to pain.    Pertinent History  osteoporosis; cardiac surgery 1964; 1969; [place]; detached retina 2011    Currently in Pain?  No/denies    Pain Score  0-No pain         [facility] PT Assessment - 01/27/19 0001      Assessment   Medical Diagnosis  Lt shoulder biceps tendon repair; SAD; DCR     Referring Provider (PT)  Dr Ophelia Charter     Onset Date/Surgical Date  11/29/18   pain since fall of 2019    Hand Dominance  Left    Next MD Visit  02/22/19    Prior Therapy  yes 7 visits here for shd pain 12/19       AROM   Left Shoulder Flexion  125 Degrees      PROM   Left Shoulder Flexion  133 Degrees   seated  with pulleys   Left Shoulder ABduction  153 Degrees   scaption with pulleys     [facility] Adult PT Treatment/Exercise - 01/27/19 0001      Shoulder Exercises: Standing   External Rotation  Both;Left;10 reps;Theraband    Theraband Level (Shoulder External Rotation)  Level 1 (Yellow)   mirror for feedback / posture     Shoulder Exercises: Pulleys   Flexion  --   10 sec hold x 10 reps   Scaption  --   10 sec hold x 10 reps     Shoulder Exercises: Stretch   Internal Rotation Stretch  3 reps   20 sec hold with strap    Other Shoulder Stretches  doorway stretch 30 sec x 1 each position; childs pose x 2 reps; thread the needle with Lt arm x 20 sec.   Verbally reviewed strengthening exercises from HEP      External Rotation Stretch Limitations  shoulder ext stretch holding door frame x 20 sec x  2 reps;  Lt tricep stretch with arm sliding up door frame x 2 reps       Modalities   Modalities  Electrical Stimulation;Moist Heat      Moist Heat Therapy   Number Minutes Moist Heat  10 Minutes    Moist Heat Location  Shoulder      Electrical Stimulation   Electrical Stimulation Location  posterior Lt shoulder    Electrical Stimulation Action  IFC    Electrical Stimulation Parameters   to tolerance    Electrical Stimulation Goals  Pain      Iontophoresis   Type of Iontophoresis  --   pt declined     Manual Therapy   Manual therapy comments  pt seated, arm supported.     Soft tissue mobilization  IASTM and TPR to Rt posterior shoulder and surrounding muscles.              PT Education - 01/27/19 1746    Education Details  HEP    Person(s) Educated  Patient    Methods  Explanation;Handout;Demonstration;Verbal cues    Comprehension  Verbalized understanding;Returned demonstration       PT Short Term Goals - 01/24/19 1609      PT SHORT TERM GOAL #1   Title  Independent in initial HEP 01/25/2019    Time  6    Period  Weeks    Status  Achieved      PT SHORT TERM GOAL #2    Title  patient reports sleeping without awakening due to shoulder pain 01/25/2019    Time  6    Status  Achieved        PT Long Term Goals - 01/27/19 1747      PT LONG TERM GOAL #1   Title  Improve posture and alignment with patient to demonstrate improved upright posture with posterior shoulder girdle engaged 03/09/2019    Time  12    Period  Weeks    Status  Partially Met      PT LONG TERM GOAL #2   Title  Increase AROM Lt shoulder to equal Rt shoulder 03/09/2019    Time  12    Period  Weeks    Status  On-going      PT LONG TERM GOAL #3   Title  Decrease pain with patient to report ability to reach up and behind back with minimal to no pain 03/09/2019    Baseline  able to reach arm up behind back, but painful    Time  12    Period  Weeks    Status  Partially Met      PT LONG TERM GOAL #4   Title  Independent in HEP 03/09/2019    Time  12    Period  Weeks    Status  On-going      PT LONG TERM GOAL #5   Title  Improve FOTO to </= 37% limitation 03/09/2019    Time  12    Period  Weeks    Status  On-going            Plan - 01/27/19 1748    Clinical Impression Statement  No significant change with ionto patch.  Lt shoulder AROM/PROM gradually improving.  Pt very point tender at infraspinatus/ teres origin on scapula; reduced with use of MHP/estim at end of session.  Pt progressing towards LTG.    Stability/Clinical Decision Making  Stable/Uncomplicated    Rehab Potential  Good    PT Frequency  2x / week    PT Duration  6 weeks    PT Treatment/Interventions  Patient/family education;ADLs/Self Care Home Management;Cryotherapy;Electrical Stimulation;Iontophoresis 83m/ml Dexamethasone;Moist Heat;Ultrasound;Manual techniques;Neuromuscular re-education;Functional mobility training;Therapeutic activities;Therapeutic exercise    PT Next Visit Plan  continue scapular stabilization; continue focus on manual work/PROM/stretching; postural correction; modalities as indicated.    PT  Home Exercise Plan  [email or web address]    Consulted and Agree with Plan of Care  Patient       Patient will benefit from skilled therapeutic intervention in order to improve the following deficits and impairments:  Pain, Increased fascial restricitons, Increased muscle spasms, Impaired UE functional use, Decreased strength, Decreased mobility, Decreased range of motion, Decreased activity tolerance  Visit Diagnosis: 1. Acute pain of left shoulder   2. Scapular dyskinesis   3. Other symptoms and signs involving the musculoskeletal system        Problem List Patient Active Problem List   Diagnosis Date Noted  . Lumbar degenerative disc disease 10/04/2018  . Pre-operative clearance 08/23/2018  . Acute shoulder bursitis, left 05/26/2018  . Left knee swelling with Baker's cyst 09/30/2017  . Status post cardiac surgery 09/05/2013  . Osteoporosis 10/10/2010  . BENIGN POSITIONAL VERTIGO 04/05/2010  . [place] 07/30[place]  . HEADACHE 07/30/2009  . POSTMENOPAUSAL STATUS 04/25/2009  . ANXIETY 09/20/2007  . Insomnia 09/20/2007   JKerin Perna PTA 01/27/19 6:04 PM  [address] [address]Phone: [phone]  Fax:  [phone] Name: Diane FRANKMRN: [id number] of Birth: [birth year]

## 2019-01-31 ENCOUNTER — Encounter: Payer: BC Managed Care – PPO | Admitting: Rehabilitative and Restorative Service Providers"

## 2019-02-03 ENCOUNTER — Encounter: Payer: Self-pay | Admitting: Physical Therapy

## 2019-02-03 ENCOUNTER — Ambulatory Visit (INDEPENDENT_AMBULATORY_CARE_PROVIDER_SITE_OTHER): Payer: BC Managed Care – PPO | Admitting: Physical Therapy

## 2019-02-03 ENCOUNTER — Other Ambulatory Visit: Payer: Self-pay

## 2019-02-03 DIAGNOSIS — M25512 Pain in left shoulder: Secondary | ICD-10-CM | POA: Diagnosis not present

## 2019-02-03 DIAGNOSIS — R29898 Other symptoms and signs involving the musculoskeletal system: Secondary | ICD-10-CM

## 2019-02-03 DIAGNOSIS — G2589 Other specified extrapyramidal and movement disorders: Secondary | ICD-10-CM

## 2019-02-03 DIAGNOSIS — I83891 Varicose veins of right lower extremities with other complications: Secondary | ICD-10-CM | POA: Diagnosis not present

## 2019-02-03 NOTE — Therapy (Addendum)
[facility] [address] [place], [place], [address] Phone: [phone]   Fax:  [phone]  Physical Therapy Treatment  Patient Details  Name: Diane Chavez MRN: [id number] Date of Birth: 08/19/1955 Referring Provider (PT): Dr Ophelia Charter    Encounter Date: 02/03/2019  PT End of Session - 02/03/19 1614    Visit Number  14    Number of Visits  24    Date for PT Re-Evaluation  03/09/19    PT Start Time  1615    PT Stop Time  1706   MHP last 10 min   PT Time Calculation (min)  51 min    Activity Tolerance  Patient tolerated treatment well    Behavior During Therapy  [facility] for tasks assessed/performed       Past Medical History:  Diagnosis Date  . Anxiety   . Crohn disease ([place])    Dr. Laverta Baltimore  . Depression    on prozac previously  . Hyperlipidemia   . Miscarriage     Past Surgical History:  Procedure Laterality Date  . CARDIAC SURGERY     as a child  . EYE SURGERY  10/30/2009   repair of detached retina  . TONSILLECTOMY  1965    There were no vitals filed for this visit.  Subjective Assessment - 02/03/19 1618    Subjective  Pt reports her Lt shoulder has been intermittently achy. She found her TENS unit last night, but hasn't used it yet.  She reports no new changes since last visit.    Pertinent History  osteoporosis; cardiac surgery 1964; 1969; [place]; detached retina 2011    Currently in Pain?  Yes    Pain Score  5     Pain Location  Shoulder    Pain Orientation  Left    Pain Descriptors / Indicators  Aching    Aggravating Factors   certain movement    Pain Relieving Factors  heating pad.         [facility] PT Assessment - 02/03/19 0001      Assessment   Medical Diagnosis  Lt shoulder biceps tendon repair; SAD; DCR     Referring Provider (PT)  Dr Ophelia Charter     Onset Date/Surgical Date  11/29/18   pain since fall of 2019    Hand Dominance  Left    Next MD Visit  02/22/19    Prior Therapy  yes 7 visits  here for shd pain 12/19       Observation/Other Assessments   Focus on Therapeutic Outcomes (FOTO)   32% limitation       AROM   Overall AROM Comments  AROM now pain freee in functronal ranges throughout     Left Shoulder Extension  42 Degrees    Left Shoulder Flexion  130 Degrees    Left Shoulder ABduction  122 Degrees   scapular plane   Left Shoulder Internal Rotation  --   Thumb to T8   Left Shoulder External Rotation  82 Degrees   arm abdct to 90 deg     Strength   Overall Strength Comments  strength is functional Lt UE       Palpation   Palpation comment  some persistent muscular tightness Lt upper quarter especially tight through the teres/lats/pecs/upper trap        [facility] Adult PT Treatment/Exercise - 02/03/19 0001      Shoulder Exercises: Standing   Flexion  Strengthening;Left;10 reps  lifting wt to overhead shelf, slowly returning to counter   Shoulder Flexion Weight (lbs)  2    Row  Strengthening;Both;10 reps    Theraband Level (Shoulder Row)  Level 3 (Green)    Other Standing Exercises  wall angel x 5 reps;  back of Lt palm against wall, (abdct ~70 deg) CW/ CCW x 10 each direction - fatigues quickly.       Shoulder Exercises: Pulleys   Flexion  3 minutes   10 sec holds   Scaption  3 minutes   10 sec holds     Shoulder Exercises: ROM/Strengthening   UBE (Upper Arm Bike)  L1: 1.5 forward, 30 sec backwards       Shoulder Exercises: Stretch   Internal Rotation Stretch  3 reps   Rt hand assisting LUE, 30 sec, simulate unhooking bra   Wall Stretch - ABduction  2 rep;20 seconds    Other Shoulder Stretches  midlevel doorway stretch x 30 sec x 3 reps    External Rotation Stretch Limitations  Lt shoulder ext stretch x 15 sec x 3 reps       Moist Heat Therapy   Number Minutes Moist Heat  10 Minutes    Moist Heat Location  Shoulder      Electrical Stimulation   Electrical Stimulation Location  --   declined     Manual Therapy   Soft tissue mobilization  STM  to Rt posterior shoulder and shoulder girdle muscles, to decrease fascial tightness and trigger points.                PT Short Term Goals - 01/24/19 1609      PT SHORT TERM GOAL #1   Title  Independent in initial HEP 01/25/2019    Time  6    Period  Weeks    Status  Achieved      PT SHORT TERM GOAL #2   Title  patient reports sleeping without awakening due to shoulder pain 01/25/2019    Time  6    Status  Achieved        PT Long Term Goals - 02/03/19 1717      PT LONG TERM GOAL #1   Title  Improve posture and alignment with patient to demonstrate improved upright posture with posterior shoulder girdle engaged 03/09/2019    Time  12    Period  Weeks    Status  Partially Met      PT LONG TERM GOAL #2   Title  Increase AROM Lt shoulder to equal Rt shoulder 03/09/2019    Time  12    Period  Weeks    Status  Partially Met      PT LONG TERM GOAL #3   Title  Decrease pain with patient to report ability to reach up and behind back with minimal to no pain 03/09/2019    Baseline  able to reach arm up behind back, but painful    Time  12    Period  Weeks    Status  Partially Met      PT LONG TERM GOAL #4   Title  Independent in HEP 03/09/2019    Time  12    Period  Weeks    Status  Achieved      PT LONG TERM GOAL #5   Title  Improve FOTO to </= 37% limitation 03/09/2019    Time  12    Period  Weeks  Status  Achieved            Plan - 02/03/19 1737    Clinical Impression Statement  Pt demonstrated improved Lt shoulder ROM.  Pt tolerated new strengthening exercises well, reporting fatigue but not increase in pain.  FOTO score has improved to 32% limited; she has met her FOTO goal.  Pt requests to hold therapy at this time, and would like to work on HEP at home.    Rehab Potential  Good    PT Frequency  2x / week    PT Duration  6 weeks    PT Treatment/Interventions  Patient/family education;ADLs/Self Care Home Management;Cryotherapy;Electrical  Stimulation;Iontophoresis 68m/ml Dexamethasone;Moist Heat;Ultrasound;Manual techniques;Neuromuscular re-education;Functional mobility training;Therapeutic activities;Therapeutic exercise    PT Next Visit Plan  will hold therapy until 03/06/19 per pt request.  If pt doesnt' return by then, will d/c.    PNeuse Forestand Agree with Plan of Care  Patient       Patient will benefit from skilled therapeutic intervention in order to improve the following deficits and impairments:  Pain, Increased fascial restricitons, Increased muscle spasms, Impaired UE functional use, Decreased strength, Decreased mobility, Decreased range of motion, Decreased activity tolerance  Visit Diagnosis: Acute pain of left shoulder  Scapular dyskinesis  Other symptoms and signs involving the musculoskeletal system     Problem List Patient Active Problem List   Diagnosis Date Noted  . Lumbar degenerative disc disease 10/04/2018  . Pre-operative clearance 08/23/2018  . Acute shoulder bursitis, left 05/26/2018  . Left knee swelling with Baker's cyst 09/30/2017  . Status post cardiac surgery 09/05/2013  . Osteoporosis 10/10/2010  . BENIGN POSITIONAL VERTIGO 04/05/2010  . [place] 07/30[place]  . HEADACHE 07/30/2009  . POSTMENOPAUSAL STATUS 04/25/2009  . ANXIETY 09/20/2007  . Insomnia 09/20/2007   JKerin Perna PTA 02/03/19 5:59 PM  [place] [address] [address]Phone: [phone]  Fax:  [phone] Name: RSHENELL ROGALSKIMRN: [id number] of Birth: [birth year] PHYSICAL THERAPY DISCHARGE SUMMARY  Visits from Start of Care: 14  Current functional level related to goals / functional outcomes: See last progress note for discharge status   Remaining deficits: Needs to continue with stretching and strengthening    Education / Equipment: HEP  Plan: Patient  agrees to discharge.  Patient goals were partially met. Patient is being discharged due to being pleased with the current functional level.  ?????    Celyn P. HHelene KelpPT, MPH 02/18/19 9:15 AM

## 2019-02-07 ENCOUNTER — Encounter: Payer: BC Managed Care – PPO | Admitting: Rehabilitative and Restorative Service Providers"

## 2019-02-10 ENCOUNTER — Encounter: Payer: BC Managed Care – PPO | Admitting: Physical Therapy

## 2019-02-11 DIAGNOSIS — I83892 Varicose veins of left lower extremities with other complications: Secondary | ICD-10-CM | POA: Diagnosis not present

## 2019-02-14 ENCOUNTER — Encounter: Payer: Self-pay | Admitting: Family Medicine

## 2019-02-14 ENCOUNTER — Encounter: Payer: BC Managed Care – PPO | Admitting: Rehabilitative and Restorative Service Providers"

## 2019-02-15 DIAGNOSIS — I83892 Varicose veins of left lower extremities with other complications: Secondary | ICD-10-CM | POA: Diagnosis not present

## 2019-02-17 ENCOUNTER — Encounter: Payer: BC Managed Care – PPO | Admitting: Rehabilitative and Restorative Service Providers"

## 2019-02-23 DIAGNOSIS — I83891 Varicose veins of right lower extremities with other complications: Secondary | ICD-10-CM | POA: Diagnosis not present

## 2019-02-24 DIAGNOSIS — I83891 Varicose veins of right lower extremities with other complications: Secondary | ICD-10-CM | POA: Diagnosis not present

## 2019-02-25 DIAGNOSIS — I83892 Varicose veins of left lower extremities with other complications: Secondary | ICD-10-CM | POA: Diagnosis not present

## 2019-03-11 DIAGNOSIS — I83891 Varicose veins of right lower extremities with other complications: Secondary | ICD-10-CM | POA: Diagnosis not present

## 2019-03-15 DIAGNOSIS — I83891 Varicose veins of right lower extremities with other complications: Secondary | ICD-10-CM | POA: Diagnosis not present

## 2019-03-19 DIAGNOSIS — S0990XA Unspecified injury of head, initial encounter: Secondary | ICD-10-CM | POA: Diagnosis not present

## 2019-03-19 DIAGNOSIS — M542 Cervicalgia: Secondary | ICD-10-CM | POA: Diagnosis not present

## 2019-03-19 DIAGNOSIS — S0003XA Contusion of scalp, initial encounter: Secondary | ICD-10-CM | POA: Diagnosis not present

## 2019-03-19 DIAGNOSIS — S4992XA Unspecified injury of left shoulder and upper arm, initial encounter: Secondary | ICD-10-CM | POA: Diagnosis not present

## 2019-03-19 DIAGNOSIS — S40012A Contusion of left shoulder, initial encounter: Secondary | ICD-10-CM | POA: Diagnosis not present

## 2019-03-19 DIAGNOSIS — S161XXA Strain of muscle, fascia and tendon at neck level, initial encounter: Secondary | ICD-10-CM | POA: Diagnosis not present

## 2019-03-19 DIAGNOSIS — G8911 Acute pain due to trauma: Secondary | ICD-10-CM | POA: Diagnosis not present

## 2019-03-22 ENCOUNTER — Inpatient Hospital Stay: Payer: BC Managed Care – PPO | Admitting: Family Medicine

## 2019-03-22 ENCOUNTER — Telehealth: Payer: Self-pay | Admitting: Family Medicine

## 2019-03-22 NOTE — Telephone Encounter (Signed)
See if can come in early tomorrow before 8 or we can put on other providers schedule.

## 2019-03-22 NOTE — Telephone Encounter (Signed)
Patient went to [facility] on Saturday. Fell and hit head and left shoulder that she had operated on. States that she needs to do a hospital in a few days if it wasn't getting better. She got a CAT scan done and an x-ray. She doesn't feel any better. No open slots for this with PCP. Please advise.

## 2019-03-22 NOTE — Telephone Encounter (Signed)
Patient coming in tomorrow morning at 7:10am. Voiced understanding and chose the appointment time. No further questions at this time.

## 2019-03-23 ENCOUNTER — Ambulatory Visit (INDEPENDENT_AMBULATORY_CARE_PROVIDER_SITE_OTHER): Payer: BC Managed Care – PPO | Admitting: Family Medicine

## 2019-03-23 ENCOUNTER — Other Ambulatory Visit: Payer: Self-pay

## 2019-03-23 ENCOUNTER — Encounter: Payer: Self-pay | Admitting: Family Medicine

## 2019-03-23 VITALS — BP 135/74 | HR 67 | Ht 69.0 in | Wt 126.0 lb

## 2019-03-23 DIAGNOSIS — M898X1 Other specified disorders of bone, shoulder: Secondary | ICD-10-CM

## 2019-03-23 DIAGNOSIS — M25512 Pain in left shoulder: Secondary | ICD-10-CM | POA: Diagnosis not present

## 2019-03-23 DIAGNOSIS — Z23 Encounter for immunization: Secondary | ICD-10-CM | POA: Diagnosis not present

## 2019-03-23 NOTE — Progress Notes (Signed)
Established Patient Office Visit  Subjective:  Patient ID: Diane Chavez, female    DOB: 1955-12-17  Age: 63 y.o. MRN: [id number]  CC:  Chief Complaint  Patient presents with  . Follow-up    HPI Diane Chavez presents for Left shoulder pain.  She was actually trying to hang something for a curtain had was standing on a wooden chest.  She said she actually squatted down to get ready to sort of jumped off of it and actually fell backwards..  She hit her posterior left shoulder blade, shoulder, right buttock area, and the back of her head.  Her husband took her to the emergency department as he was quite worried about her.  They did do a head CT and cervical CT which essentially was normal just showed some arthritis in her neck.  She is feeling well in that regard just a little bit of soreness at the base of the head but actually feels like her neck feels better than it did before she fell.  But she still having problems and pain in that left shoulder and over the scapula.  She also feels like she is had decreased range of motion.  She had just had surgery on that shoulder in June and has been going to physical therapy throughout the end of August.  She has been using meloxicam for pain relief and that has been helpful.  She is also been using the Norflex as needed and she says that has been helpful as well.  Past Medical History:  Diagnosis Date  . Anxiety   . Crohn disease ([place])    Dr. Laverta Baltimore  . Depression    on prozac previously  . Hyperlipidemia   . Miscarriage     Past Surgical History:  Procedure Laterality Date  . CARDIAC SURGERY     as a child  . EYE SURGERY  10/30/2009   repair of detached retina  . TONSILLECTOMY  1965    Family History  Problem Relation Age of Onset  . Alzheimer's disease Mother     Social History   Socioeconomic History  . Marital status: Married    Spouse name: [name]  . Number of children: 3  . Years of education: Not on file  . Highest  education level: Not on file  Occupational History  . 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 Financial resource strain: Not on file  . Food insecurity    Worry: Not on file    Inability: Not on file  . Transportation needs    Medical: Not on file    Non-medical: Not on file  Tobacco Use  . Smoking status: Former Smoker    Quit date: 06/16/1974    Years since quitting: 44.7  . Smokeless tobacco: Never Used  Substance and Sexual Activity  . Alcohol use: Yes    Alcohol/week: 7.0 standard drinks    Types: 7 Glasses of wine per week  . Drug use: No  . Sexual activity: Not on file  Lifestyle  . Physical activity    Days per week: Not on file    Minutes per session: Not on file  . Stress: Not on file  Relationships  . Social Herbalist on phone: Not on file    Gets together: Not on file    Attends religious service: Not on file    Active member of club or organization: Not on file  Attends meetings of clubs or organizations: Not on file    Relationship status: Not on file  . Intimate partner violence    Fear of current or ex partner: Not on file    Emotionally abused: Not on file    Physically abused: Not on file    Forced sexual activity: Not on file  Other Topics Concern  . Not on file  Social History Narrative   Lives with husband.Regular exercise-no.    Outpatient Medications Prior to Visit  Medication Sig Dispense Refill  . B Complex-Folic Acid (B COMPLEX PLUS PO) Take 1 tablet by mouth daily.      Marland Kitchen BORON PO Take by mouth.    . Calcium Carbonate-Vit D-Min (CALCIUM 1200 PO) Take by mouth.      . cholecalciferol (VITAMIN D) 1000 units tablet Take 1,000 Units by mouth daily.    Marland Kitchen ibuprofen (ADVIL) 800 MG tablet TK 1 T PO TID    . LUTEIN PO Take 1 tablet by mouth daily.    . mesalamine (LIALDA) 1.2 g EC tablet Take by mouth daily with breakfast.    . Omega-3 Fatty Acids (FISH OIL PO) Take 2 capsules by mouth daily.    .  orphenadrine (NORFLEX) 100 MG tablet TK 1 T PO BID PRN FOR UP TO 15 DAYS    . VITAMIN K PO Take by mouth.    . meloxicam (MOBIC) 15 MG tablet One tab PO qAM with breakfast for 2 weeks, then daily prn pain. (Patient not taking: Reported on 03/23/2019) 30 tablet 3  . cyclobenzaprine (FLEXERIL) 10 MG tablet Take 1 tablet (10 mg total) by mouth 3 (three) times daily as needed for muscle spasms. 30 tablet 0  . naproxen (NAPROSYN) 500 MG tablet Take 1 tablet (500 mg total) by mouth 2 (two) times daily with a meal. 30 tablet 0  . predniSONE (DELTASONE) 50 MG tablet One tab PO daily for 5 days. 5 tablet 0   No facility-administered medications prior to visit.     No Known Allergies  ROS Review of Systems    Objective:    Physical Exam  Constitutional: She is oriented to person, place, and time. She appears well-developed and well-nourished.  HENT:  Head: Normocephalic and atraumatic.  Eyes: Conjunctivae and EOM are normal.  Cardiovascular: Normal rate.  Pulmonary/Chest: Effort normal.  Musculoskeletal:     Comments: Left shoulder is nontender on exam.  No increased warmth or swelling.  She is able to extend forward to the right at 90 degrees and not much past that.  If she extends her arm outward she is able to get to about 110 degrees.  She is able to reach behind her back but says it feels painful in her shoulder to do so.  And she had significant difficulty reaching across to her opposite shoulder.  She is a little bit tender over 2 places on the scapula.  1 where there is a bruise and one near the distal end.  But no clicking or popping or severe pain with palpation.  Neurological: She is alert and oriented to person, place, and time.  Skin: Skin is dry. No pallor.  Psychiatric: She has a normal mood and affect. Her behavior is normal.  Vitals reviewed.   BP 135/74   Pulse 67   Ht 5' 9"  (1.753 m)   Wt 126 lb (57.2 kg)   LMP 06/11/2000   SpO2 100%   BMI 18.61 kg/m  Wt Readings from  Last 3 Encounters:  03/23/19 126 lb (57.2 kg)  10/04/18 125 lb (56.7 kg)  08/23/18 120 lb (54.4 kg)     Health Maintenance Due  Topic Date Due  . MAMMOGRAM  10/15/2017    There are no preventive care reminders to display for this patient.  Lab Results  Component Value Date   TSH 0.58 10/15/2017   Lab Results  Component Value Date   WBC 5.1 08/23/2018   HGB 13.3 08/23/2018   HCT 39.6 08/23/2018   MCV 94.1 08/23/2018   PLT 190 08/23/2018   Lab Results  Component Value Date   NA 137 08/23/2018   K 4.2 08/23/2018   CO2 31 08/23/2018   GLUCOSE 81 08/23/2018   BUN 15 08/23/2018   CREATININE 0.72 08/23/2018   BILITOT 0.5 08/23/2018   ALKPHOS 59 09/26/2016   AST 22 08/23/2018   ALT 18 08/23/2018   PROT 7.0 08/23/2018   ALBUMIN 4.1 09/26/2016   CALCIUM 9.6 08/23/2018   Lab Results  Component Value Date   CHOL 235 (H) 10/15/2017   Lab Results  Component Value Date   HDL 83 10/15/2017   Lab Results  Component Value Date   LDLCALC 138 (H) 10/15/2017   Lab Results  Component Value Date   TRIG 46 10/15/2017   Lab Results  Component Value Date   CHOLHDL 2.8 10/15/2017   No results found for: HGBA1C    Assessment & Plan:   Problem List Items Addressed This Visit    None    Visit Diagnoses    Acute pain of left shoulder    -  Primary   Need for immunization against influenza       Relevant Orders   Flu Vaccine QUAD 36+ mos IM (Completed)   Pain of left scapula          Left shoulder pain secondary to fall status post surgery a couple of months ago.  Recommend referral back to PT to work on increasing range of motion and improve pain.  Okay to continue anti-inflammatory.  Recommend more frequent icing.  Also okay to refill her muscle relaxer in a week or 2 if needed, since she does find it somewhat helpful she just says it is a little sedating.  She is only been using it at night.  If not improving over the next couple of weeks then will consider more  definitive x-rays.  Head injury-she is actually feeling much better and has not had any residual symptoms but a little soreness right at the base of the skull near the neck.  Head CT was negative.  No orders of the defined types were placed in this encounter.   Follow-up: Return if symptoms worsen or fail to improve.    Beatrice Lecher, MD

## 2019-03-24 ENCOUNTER — Ambulatory Visit (INDEPENDENT_AMBULATORY_CARE_PROVIDER_SITE_OTHER): Payer: BC Managed Care – PPO | Admitting: Physical Therapy

## 2019-03-24 ENCOUNTER — Other Ambulatory Visit: Payer: Self-pay

## 2019-03-24 ENCOUNTER — Encounter: Payer: Self-pay | Admitting: Physical Therapy

## 2019-03-24 DIAGNOSIS — R29898 Other symptoms and signs involving the musculoskeletal system: Secondary | ICD-10-CM | POA: Diagnosis not present

## 2019-03-24 DIAGNOSIS — M25512 Pain in left shoulder: Secondary | ICD-10-CM | POA: Diagnosis not present

## 2019-03-24 DIAGNOSIS — G2589 Other specified extrapyramidal and movement disorders: Secondary | ICD-10-CM

## 2019-03-24 NOTE — Therapy (Signed)
[place], [place], [address] Phone: [phone]   Fax:  [phone]  Physical Therapy Evaluation  Patient Details  Name: Diane Chavez MRN: [id number] Date of Birth: 1955/10/27 Referring Provider (PT): [name], MD   Encounter Date: 03/24/2019  PT End of Session - 03/24/19 0844    Visit Number  1    Number of Visits  12    Date for PT Re-Evaluation  05/05/19    PT Start Time  0801    PT Stop Time  0838    PT Time Calculation (min)  37 min    Activity Tolerance  Patient tolerated treatment well    Behavior During Therapy  [facility] for tasks assessed/performed       Past Medical History:  Diagnosis Date  . Anxiety   . Crohn disease ([place])    Dr. Laverta Baltimore  . Depression    on prozac previously  . Hyperlipidemia   . Miscarriage     Past Surgical History:  Procedure Laterality Date  . CARDIAC SURGERY     as a child  . EYE SURGERY  10/30/2009   repair of detached retina  . TONSILLECTOMY  1965    There were no vitals filed for this visit.   Subjective Assessment - 03/24/19 0804    Subjective  Pt states she had a fall on 03/19/19 from approx 3' falling back on the headboard with her Lt shoulder and head.  Presents today with increased pain in Lt shoulder and scapula.  Pt feels ROM is now limited from where she was at prior PT sessions.    Pertinent History  osteoporosis; cardiac surgery 1964; 1969; [place]; detached retina 2011; Lt shoulder sx in June (biceps tenodisis, SAD and DCR)    Diagnostic tests  CT head and neck negative    Patient Stated Goals  improve the pain    Currently in Pain?  Yes    Pain Score  4    up to 6/10   Pain Location  Scapula    Pain Orientation  Left    Pain Descriptors / Indicators  Aching    Pain Type  Acute pain    Pain Onset  In the past 7 days    Pain Frequency  Constant    Aggravating Factors   movement, otherwise constant ache    Pain Relieving  Factors  ice, muscle relaxer         [facility] PT Assessment - 03/24/19 0808      Assessment   Medical Diagnosis  M89.8X1 (ICD-10-CM) - Pain of left scapula    Referring Provider (PT)  [name], MD    Onset Date/Surgical Date  03/19/19   surgery 11/29/2018   Hand Dominance  Left    Next MD Visit  04/18/2019    Prior Therapy  recently following Lt shoulder surgery      Precautions   Precautions  None      Restrictions   Weight Bearing Restrictions  No      Balance Screen   Has the patient fallen in the past 6 months  Yes    How many times?  1    Has the patient had a decrease in activity level because of a fear of falling?   No    Is the patient reluctant to leave their home because of a fear of falling?   No      Prior Function  Level of Independence  Independent    Vocation  Full time employment    Vocation Requirements  office work    Leisure  reading, riding; walking on treadmill (working on 1x/day)      Cognition   Overall Cognitive Status  Within Functional Limits for tasks assessed      Observation/Other Assessments   Focus on Therapeutic Outcomes (FOTO)   48 (52% limited; predicted 28% limited)      Posture/Postural Control   Posture/Postural Control  Postural limitations    Postural Limitations  Rounded Shoulders;Forward head      ROM / Strength   AROM / PROM / Strength  AROM;Strength      AROM   Left Shoulder Flexion  85 Degrees    Left Shoulder ABduction  88 Degrees    Left Shoulder Internal Rotation  --   FIR to T12     PROM   Left Shoulder Flexion  134 Degrees    Left Shoulder ABduction  108 Degrees      Strength   Overall Strength Comments  pain with all shoulder testing    Strength Assessment Site  Shoulder;Elbow    Right/Left Shoulder  Left    Left Shoulder Flexion  3-/5    Left Shoulder ABduction  3-/5    Left Shoulder Internal Rotation  3+/5    Left Shoulder External Rotation  2/5    Right/Left Elbow  Left    Left Elbow  Flexion  5/5    Left Elbow Extension  3/5   with pain     Palpation   Palpation comment  muscular tightness Lt upper quarter especially tight through the teres/lats/pecs/upper trap; pain along medial and lateral border of scapula/rhomboids/infraspinatus                Objective measurements completed on examination: See above findings.      [facility] Adult PT Treatment/Exercise - 03/24/19 0808      Self-Care   Self-Care  Other Self-Care Comments    Other Self-Care Comments   reviewed cane exercises, pulleys and use of 1 lb weight with pendulum for gentle distraction; scapular retraction reviewed.      Exercises   Exercises  Shoulder      Manual Therapy   Joint Mobilization  gentle distraction Lt shoulder, gentle grades 1-2 A/P mobs             PT Education - 03/24/19 0844    Education Details  HEP    Person(s) Educated  Patient    Methods  Explanation;Handout    Comprehension  Verbalized understanding       PT Short Term Goals - 03/24/19 1042      PT SHORT TERM GOAL #1   Title  n/a      PT SHORT TERM GOAL #2   Title  n/a        PT Long Term Goals - 03/24/19 1042      PT LONG TERM GOAL #1   Title  Improve posture and alignment with patient to demonstrate improved upright posture with posterior shoulder girdle engaged    Status  New    Target Date  05/05/19      PT LONG TERM GOAL #2   Title  Increase AROM Lt shoulder to equal Rt shoulder    Status  New    Target Date  05/05/19      PT LONG TERM GOAL #3   Title  Decrease pain with patient to  report ability to reach up and behind back with minimal to no pain    Status  New    Target Date  05/05/19      PT LONG TERM GOAL #4   Title  Independent in HEP    Status  New    Target Date  05/05/19      PT LONG TERM GOAL #5   Title  Improve FOTO to </= 28% limitation    Status  New    Target Date  05/05/19             Plan - 03/24/19 1037    Clinical Impression Statement  Pt is a 63 y/o  female who returns to OPPT s/p fall ~ 1.5 weeks ago onto Lt shoulder, which she was recently just d/c'ed from PT s/p Lt shoulder surgery.  Pt demonstrates decreased strength and ROM with Lt scapular winging and increased trigger points to area from fall affecting functional mobility.  HEP issued today to address ROM then plan to revisit strengthening program once ROM restored.  Lt shoulder ER strength significantly decreased and will need to monitor for improvement, and if no improvement may need further imaging to rule out tear.    Personal Factors and Comorbidities  Past/Current Experience;Comorbidity 3+    Comorbidities  Lt shoulder SAD/DCR, anxiety, depression, migraines    Examination-Activity Limitations  Lift;Hygiene/Grooming;Dressing;Carry;Reach Overhead    Examination-Participation Restrictions  Other;Driving;Laundry;Cleaning   work   Stability/Clinical Decision Making  Evolving/Moderate complexity    Clinical Decision Making  Moderate    Rehab Potential  Good    PT Frequency  2x / week    PT Duration  6 weeks    PT Treatment/Interventions  Patient/family education;ADLs/Self Care Home Management;Cryotherapy;Electrical Stimulation;Iontophoresis 26m/ml Dexamethasone;Moist Heat;Ultrasound;Manual techniques;Neuromuscular re-education;Functional mobility training;Therapeutic activities;Therapeutic exercise    PT Next Visit Plan  continue with ROM and scapular strengthening, progress strengthening program once ROM restored    PT Home Exercise Plan  Access Code: [email or web address]   Consulted and Agree with Plan of Care  Patient       Patient will benefit from skilled therapeutic intervention in order to improve the following deficits and impairments:  Pain, Increased fascial restricitons, Increased muscle spasms, Impaired UE functional use, Decreased strength, Decreased mobility, Decreased range of motion, Decreased activity tolerance  Visit Diagnosis: Acute pain of left shoulder - Plan: PT plan of  care cert/re-cert  Other symptoms and signs involving the musculoskeletal system - Plan: PT plan of care cert/re-cert  Scapular dyskinesis - Plan: PT plan of care cert/re-cert     Problem List Patient Active Problem List   Diagnosis Date Noted  . Lumbar degenerative disc disease 10/04/2018  . Acute shoulder bursitis, left 05/26/2018  . Left knee swelling with Baker's cyst 09/30/2017  . Status post cardiac surgery 09/05/2013  . Epiretinal membrane 12/10/2012  . Osteoporosis 10/10/2010  . BENIGN POSITIONAL VERTIGO 04/05/2010  . Regional enteritis ([place] 07/30/2009  . HEADACHE 07/30/2009  . Headache 07/30/2009  . POSTMENOPAUSAL STATUS 04/25/2009  . ANXIETY 09/20/2007  . Insomnia 09/20/2007      SLaureen Abrahams PT, DPT 03/24/19 10:48 AM      [facility] [address]Phone: [phone]  Fax:  [phone] Name: RILYSE TREMAINMRN: [id number] of Birth: [birth year]

## 2019-03-24 NOTE — Patient Instructions (Signed)
Access Code: [email or web address]  URL: [email or web address]  Date: 03/24/2019  Prepared by: Faustino Congress   Exercises  Supine Shoulder Flexion with [place] - 10 reps - 1 sets - 3-5 sec hold - 2x daily - 7x weekly  Standing Bilateral Shoulder Internal Rotation AAROM with Dowel - 10 reps - 1 sets - 3-5 sec hold - 1x daily - 7x weekly  Shoulder Scaption AAROM with Dowel - 10 reps - 1 sets - 3-5 sec hold - 2x daily - 7x weekly  Seated Shoulder Flexion AAROM with Pulley Behind - 3-5 sec hold - 3 min - 1x daily - 7x weekly  Seated Shoulder Scaption AAROM with Pulley at Side - 3-5 sec hold - 3 min - 1x daily - 7x weekly  Seated Scapular Retraction - 10 reps - 1 sets - 5 sec hold - 2x daily - 7x weekly  Circular Shoulder Pendulum with Table Support - 10 reps - 1 sets - 1x daily - 7x weekly

## 2019-03-25 DIAGNOSIS — I83892 Varicose veins of left lower extremities with other complications: Secondary | ICD-10-CM | POA: Diagnosis not present

## 2019-03-28 ENCOUNTER — Ambulatory Visit (INDEPENDENT_AMBULATORY_CARE_PROVIDER_SITE_OTHER): Payer: BC Managed Care – PPO | Admitting: Physical Therapy

## 2019-03-28 ENCOUNTER — Encounter: Payer: Self-pay | Admitting: Physical Therapy

## 2019-03-28 ENCOUNTER — Other Ambulatory Visit: Payer: Self-pay

## 2019-03-28 DIAGNOSIS — R29898 Other symptoms and signs involving the musculoskeletal system: Secondary | ICD-10-CM

## 2019-03-28 DIAGNOSIS — G2589 Other specified extrapyramidal and movement disorders: Secondary | ICD-10-CM | POA: Diagnosis not present

## 2019-03-28 DIAGNOSIS — M25512 Pain in left shoulder: Secondary | ICD-10-CM

## 2019-03-28 NOTE — Patient Instructions (Signed)
Access Code: [email or web address]  URL: [email or web address]  Date: 03/28/2019  Prepared by: Kerin Perna   Exercises  Added:  Isometric Shoulder Flexion at [place] - 10 reps - 2 sets - 5 hold - 2x daily - 7x weekly  Isometric Shoulder Extension at [place] 10 reps - 1 sets - 5 hold - 2x daily - 7x weekly  Standing Isometric Shoulder Internal Rotation at [place] - 10 reps - 1 sets - 5 hold - 2x daily - 7x weekly  Isometric Shoulder External Rotation at Wall - 10 reps - 1 sets - 5 hold - 2x daily - 7x weekly

## 2019-03-28 NOTE — Therapy (Signed)
[place], [place], [address] Phone: [phone]   Fax:  [phone]  Physical Therapy Treatment  Patient Details  Name: Diane Chavez MRN: [id number] Date of Birth: 1955/07/23 Referring Provider (PT): [name], MD   Encounter Date: 03/28/2019  PT End of Session - 03/28/19 1559    Visit Number  2    Number of Visits  12    Date for PT Re-Evaluation  05/05/19    PT Start Time  7939    PT Stop Time  1648    PT Time Calculation (min)  53 min    Activity Tolerance  Patient tolerated treatment well    Behavior During Therapy  [facility] for tasks assessed/performed       Past Medical History:  Diagnosis Date  . Anxiety   . Crohn disease ([place])    Dr. Laverta Baltimore  . Depression    on prozac previously  . Hyperlipidemia   . Miscarriage     Past Surgical History:  Procedure Laterality Date  . CARDIAC SURGERY     as a child  . EYE SURGERY  10/30/2009   repair of detached retina  . TONSILLECTOMY  1965    There were no vitals filed for this visit.  Subjective Assessment - 03/28/19 1601    Subjective  Pt reports she has been doing her cane and pulley exercises. Using Rt arm to do most ADLs.  No new changes since last visit.    Currently in Pain?  Yes    Pain Score  4    up to 6/10.   Pain Location  Shoulder    Pain Orientation  Left    Pain Descriptors / Indicators  Sharp    Aggravating Factors   certain movements    Pain Relieving Factors  ice, muscle relaxer.         [facility] PT Assessment - 03/28/19 0001      Assessment   Medical Diagnosis  M89.8X1 (ICD-10-CM) - Pain of left scapula    Referring Provider (PT)  [name], MD    Onset Date/Surgical Date  03/19/19   surgery 11/29/2018   Hand Dominance  Left    Next MD Visit  04/18/2019    Prior Therapy  recently following Lt shoulder surgery      PROM   Left Shoulder Flexion  134 Degrees   supine, AAROM with cane   Left Shoulder External Rotation  80 Degrees   AAROM with cane in supported scaption      [facility] Adult PT Treatment/Exercise - 03/28/19 0001      Shoulder Exercises: Supine   Flexion  AAROM;Both;10 reps   cane   ABduction  AAROM;Left;10 reps   cane     Shoulder Exercises: Standing   Internal Rotation  Both;5 reps   cane behind back   ABduction  AAROM;Left;5 reps   cane, mirror for posture feedback   Extension  AAROM;Both;10 reps   cane     Shoulder Exercises: Pulleys   Flexion  --   5 reps x 10 sec    Scaption  --   5 reps x 10      Shoulder Exercises: Isometric Strengthening   Flexion  --   5" x 10   Extension  --   5" x 10   External Rotation  --   5" x 10   Internal Rotation  --   5" x 10  ABduction  --   5" x 10     Modalities   Modalities  Vasopneumatic      Moist Heat Therapy   Number Minutes Moist Heat  10 Minutes    Moist Heat Location  Cervical   during vaso     Vasopneumatic   Number Minutes Vasopneumatic   10 minutes    Vasopnuematic Location   Shoulder   Lt   Vasopneumatic Pressure  Low    Vasopneumatic Temperature   34 deg       Manual Therapy   Soft tissue mobilization  STM to Lt infraspinatus, lat and pec major       Kinesiotix   Create Space  I strip of [place] tape applied in x pattern over Lt infraspinatus with 20% stretch to decompress area and decrease pain, improve proprioception.              PT Education - 03/28/19 1657    Education Details  HEP -added isometrics    Person(s) Educated  Patient    Methods  Explanation;Demonstration;Handout    Comprehension  Returned demonstration;Verbalized understanding       PT Short Term Goals - 03/24/19 1042      PT SHORT TERM GOAL #1   Title  n/a      PT SHORT TERM GOAL #2   Title  n/a        PT Long Term Goals - 03/24/19 1042      PT LONG TERM GOAL #1   Title  Improve posture and alignment with patient to demonstrate improved upright posture with posterior shoulder  girdle engaged    Status  New    Target Date  05/05/19      PT LONG TERM GOAL #2   Title  Increase AROM Lt shoulder to equal Rt shoulder    Status  New    Target Date  05/05/19      PT LONG TERM GOAL #3   Title  Decrease pain with patient to report ability to reach up and behind back with minimal to no pain    Status  New    Target Date  05/05/19      PT LONG TERM GOAL #4   Title  Independent in HEP    Status  New    Target Date  05/05/19      PT LONG TERM GOAL #5   Title  Improve FOTO to </= 28% limitation    Status  New    Target Date  05/05/19            Plan - 03/28/19 1653    Clinical Impression Statement  Pt tolerated AAROM well; required minor cues to dial back contraction of isometric to remain pain free.  Pt unsure if she liked compression component of vaso at end of session; will likely tolerate ice/ heat pack better next visit.  Goals are ongoing.    Personal Factors and Comorbidities  Past/Current Experience;Comorbidity 3+    Comorbidities  Lt shoulder SAD/DCR, anxiety, depression, migraines    Examination-Activity Limitations  Lift;Hygiene/Grooming;Dressing;Carry;Reach Overhead    Examination-Participation Restrictions  Other;Driving;Laundry;Cleaning   work   Stability/Clinical Decision Making  Evolving/Moderate complexity    Rehab Potential  Good    PT Frequency  2x / week    PT Duration  6 weeks    PT Treatment/Interventions  Patient/family education;ADLs/Self Care Home Management;Cryotherapy;Electrical Stimulation;Iontophoresis 79m/ml Dexamethasone;Moist Heat;Ultrasound;Manual techniques;Neuromuscular re-education;Functional mobility training;Therapeutic activities;Therapeutic exercise    PT  Next Visit Plan  continue with ROM and scapular strengthening, progress strengthening program once ROM restored    PT Home Exercise Plan  Access Code: [email or web address]    Consulted and Agree with Plan of Care  Patient       Patient will benefit from skilled therapeutic  intervention in order to improve the following deficits and impairments:  Pain, Increased fascial restricitons, Increased muscle spasms, Impaired UE functional use, Decreased strength, Decreased mobility, Decreased range of motion, Decreased activity tolerance  Visit Diagnosis: Acute pain of left shoulder  Other symptoms and signs involving the musculoskeletal system  Scapular dyskinesis     Problem List Patient Active Problem List   Diagnosis Date Noted  . Lumbar degenerative disc disease 10/04/2018  . Acute shoulder bursitis, left 05/26/2018  . Left knee swelling with Baker's cyst 09/30/2017  . Status post cardiac surgery 09/05/2013  . Epiretinal membrane 12/10/2012  . Osteoporosis 10/10/2010  . BENIGN POSITIONAL VERTIGO 04/05/2010  . Regional enteritis ([place]) 07/30/2009  . HEADACHE 07/30/2009  . Headache 07/30/2009  . POSTMENOPAUSAL STATUS 04/25/2009  . ANXIETY 09/20/2007  . Insomnia 09/20/2007   Kerin Perna, PTA 03/28/19 5:00 PM  [place], [place], [address] Phone: [phone]   Fax:  [phone]  Name: Diane Chavez MRN: [id number] Date of Birth: 27-Nov-1955

## 2019-03-30 ENCOUNTER — Other Ambulatory Visit: Payer: Self-pay

## 2019-03-30 ENCOUNTER — Ambulatory Visit (INDEPENDENT_AMBULATORY_CARE_PROVIDER_SITE_OTHER): Payer: BC Managed Care – PPO | Admitting: Physical Therapy

## 2019-03-30 DIAGNOSIS — G2589 Other specified extrapyramidal and movement disorders: Secondary | ICD-10-CM | POA: Diagnosis not present

## 2019-03-30 DIAGNOSIS — M25512 Pain in left shoulder: Secondary | ICD-10-CM

## 2019-03-30 DIAGNOSIS — R29898 Other symptoms and signs involving the musculoskeletal system: Secondary | ICD-10-CM | POA: Diagnosis not present

## 2019-03-30 NOTE — Therapy (Signed)
[place], [place], [address] Phone: [phone]   Fax:  [phone]  Physical Therapy Treatment  Patient Details  Name: Diane Chavez MRN: [id number] Date of Birth: [birth year] Referring Provider (PT): [name], MD   Encounter Date: 03/30/2019  PT End of Session - 03/30/19 1145    Visit Number  3    Number of Visits  12    Date for PT Re-Evaluation  05/05/19    PT Start Time  2409    PT Stop Time  1236    PT Time Calculation (min)  49 min       Past Medical History:  Diagnosis Date  . Anxiety   . Crohn disease ([place])    Dr. Laverta Baltimore  . Depression    on prozac previously  . Hyperlipidemia   . Miscarriage     Past Surgical History:  Procedure Laterality Date  . CARDIAC SURGERY     as a child  . EYE SURGERY  10/30/2009   repair of detached retina  . TONSILLECTOMY  1965    There were no vitals filed for this visit.  Subjective Assessment - 03/30/19 1150    Subjective  Diane Chavez reports she is more sore since last visit.  She did not like the vaso; "the thing that crushed me".  Had to take a muscle relaxer to sleep.    Currently in Pain?  Yes    Pain Score  4     Pain Location  Shoulder    Pain Orientation  Left    Pain Descriptors / Indicators  Throbbing         [facility] PT Assessment - 03/30/19 0001      Assessment   Medical Diagnosis  M89.8X1 (ICD-10-CM) - Pain of left scapula    Referring Provider (PT)  [name], MD    Onset Date/Surgical Date  03/19/19   surgery 11/29/2018   Hand Dominance  Left    Next MD Visit  04/18/2019    Prior Therapy  recently following Lt shoulder surgery      AROM   Left Shoulder Extension  30 Degrees    Left Shoulder Flexion  110 Degrees      PROM   Left Shoulder Flexion  140 Degrees   supine AAROM   Left Shoulder ABduction  110 Degrees   scaption, supine with cane      [facility] Adult PT Treatment/Exercise - 03/30/19  0001      Shoulder Exercises: Supine   Flexion  AAROM;Both;10 reps   cane   ABduction  AAROM;Left;10 reps   cane     Shoulder Exercises: Seated   Extension  --      Shoulder Exercises: Sidelying   External Rotation  AROM;Left;10 reps    Flexion  AAROM;Left;10 reps      Shoulder Exercises: Standing   Internal Rotation  AAROM;10 reps;Both   assist from RUE   Flexion  Left;10 reps;AAROM;AROM   Reaching to shoulder and above eye level shelf   Extension  AAROM;10 reps;Left   cane   Row  Strengthening;Left;12 reps;Theraband    Theraband Level (Shoulder Row)  Level 2 (Red)    Retraction  --    Theraband Level (Shoulder Retraction)  --    Retraction Limitations  --    Other Standing Exercises  Standing pendulum with 1lb weight, CW/CCW x 10 each, 2 sets    Other Standing Exercises  Scap retraction;  holding ball w/ diaganol,elbow flexion/ext., forearm pronation/supination      Shoulder Exercises: Therapy Ball   Flexion  Left;10 reps   hand on ball on wall   Flexion Limitations  shoulder at 70 degrees flexion CW/CCW      Moist Heat Therapy   Number Minutes Moist Heat  10 Minutes    Moist Heat Location  Shoulder   Lt      Electrical Stimulation   Electrical Stimulation Location  Lt posterior shoulder    Electrical Stimulation Action  IFC    Electrical Stimulation Parameters  Intensity to pt tolerance    Electrical Stimulation Goals  Pain               PT Short Term Goals - 03/24/19 1042      PT SHORT TERM GOAL #1   Title  n/a      PT SHORT TERM GOAL #2   Title  n/a        PT Long Term Goals - 03/24/19 1042      PT LONG TERM GOAL #1   Title  Improve posture and alignment with patient to demonstrate improved upright posture with posterior shoulder girdle engaged    Status  New    Target Date  05/05/19      PT LONG TERM GOAL #2   Title  Increase AROM Lt shoulder to equal Rt shoulder    Status  New    Target Date  05/05/19      PT LONG TERM GOAL #3    Title  Decrease pain with patient to report ability to reach up and behind back with minimal to no pain    Status  New    Target Date  05/05/19      PT LONG TERM GOAL #4   Title  Independent in HEP    Status  New    Target Date  05/05/19      PT LONG TERM GOAL #5   Title  Improve FOTO to </= 28% limitation    Status  New    Target Date  05/05/19            Plan - 03/30/19 1229    Clinical Impression Statement  Pt tolerated AAROM exercises with mild increase pain in Lt shoulder; pain reduced with short rest breaks between exercises. . Pt L shoulder flexion increased. Pt tolerated heat and e-stim better than vasopneumatic. Pt making progress toward all goals with good effort.    Personal Factors and Comorbidities  Past/Current Experience;Comorbidity 3+    Comorbidities  Lt shoulder SAD/DCR, anxiety, depression, migraines    Examination-Activity Limitations  Lift;Hygiene/Grooming;Dressing;Carry;Reach Overhead    Examination-Participation Restrictions  Other;Driving;Laundry;Cleaning    Rehab Potential  Poor    PT Frequency  2x / week    PT Duration  6 weeks    PT Treatment/Interventions  Patient/family education;ADLs/Self Care Home Management;Cryotherapy;Electrical Stimulation;Iontophoresis 65m/ml Dexamethasone;Moist Heat;Ultrasound;Manual techniques;Neuromuscular re-education;Functional mobility training;Therapeutic activities;Therapeutic exercise    PT Next Visit Plan  continue with ROM and scapular strengthening, progress strengthening program once ROM restored; modalities as needed    PT Home Exercise Plan  Access Code: [email or web address]   Consulted and Agree with Plan of Care  Patient       Patient will benefit from skilled therapeutic intervention in order to improve the following deficits and impairments:  Pain, Increased fascial restricitons, Increased muscle spasms, Impaired UE functional use, Decreased strength, Decreased mobility, Decreased range of motion, Decreased activity  tolerance  Visit Diagnosis: Acute pain of left shoulder  Other symptoms and signs involving the musculoskeletal system  Scapular dyskinesis     Problem List Patient Active Problem List   Diagnosis Date Noted  . Lumbar degenerative disc disease 10/04/2018  . Acute shoulder bursitis, left 05/26/2018  . Left knee swelling with Baker's cyst 09/30/2017  . Status post cardiac surgery 09/05/2013  . Epiretinal membrane 12/10/2012  . Osteoporosis 10/10/2010  . BENIGN POSITIONAL VERTIGO 04/05/2010  . Regional enteritis ([place]) 07/30/2009  . HEADACHE 07/30/2009  . Headache 07/30/2009  . POSTMENOPAUSAL STATUS 04/25/2009  . ANXIETY 09/20/2007  . Insomnia 09/20/2007   Kerin Perna, PTA 03/30/19 2:26 PM  [place], [place], [address] Phone: [phone]   Fax:  [phone]  Name: Diane Chavez MRN: [id number] Date of Birth: 1956/03/14

## 2019-04-04 ENCOUNTER — Encounter: Payer: BC Managed Care – PPO | Admitting: Physical Therapy

## 2019-04-07 ENCOUNTER — Ambulatory Visit (INDEPENDENT_AMBULATORY_CARE_PROVIDER_SITE_OTHER): Payer: BC Managed Care – PPO | Admitting: Physical Therapy

## 2019-04-07 ENCOUNTER — Encounter: Payer: Self-pay | Admitting: Physical Therapy

## 2019-04-07 ENCOUNTER — Other Ambulatory Visit: Payer: Self-pay

## 2019-04-07 DIAGNOSIS — G2589 Other specified extrapyramidal and movement disorders: Secondary | ICD-10-CM

## 2019-04-07 DIAGNOSIS — R29898 Other symptoms and signs involving the musculoskeletal system: Secondary | ICD-10-CM

## 2019-04-07 DIAGNOSIS — M25512 Pain in left shoulder: Secondary | ICD-10-CM

## 2019-04-07 NOTE — Patient Instructions (Signed)
Flexibility: Corner Stretch    Standing in corner with hands just above shoulder level and feet _?___ inches from corner, lean forward until a comfortable stretch is felt across chest. Hold __15-30__ seconds. Repeat __2__ times per set. Do __1__ sets per session. Do __1__ sessions per day.  Strengthening: Resisted Internal Rotation   Hold tubing in left hand, elbow at side and forearm out. Rotate forearm in across body. Repeat _10___ times per set. Do __2__ sets per session. Do __1__ sessions per day.  Strengthening: Resisted Extension   Hold tubing in left hand, arm forward. Pull arm back, elbow straight. Repeat __10__ times per set. Do __2_ sets per session. Do ____ sessions per day.   Low Row: Single Arm   Face anchor in stride stance. Palm up, pull arm back while squeezing shoulder blades together. Repeat 10__ times per set. Repeat with other arm. Do _2-3_ sets per session. Do 3__ sessions per week.  Press: Thumb Up (Single Arm)   Face away from anchor in stride stance, leg forward opposite exercising arm. Press arm forward with thumb up. Repeat 10 times per set.  Do _2-3_ sets per session. Do _3_ sessions per week.   Resisted External Rotation: in Neutral - Bilateral  PALMS UP!!! Sit or stand, tubing in both hands, elbows at sides, bent to 90, forearms forward. Pinch shoulder blades together and rotate forearms out. Keep elbows at sides. Repeat __10__ times per set. Do __2-3__ sets per session. Do __3-4__ sessions per week.

## 2019-04-07 NOTE — Therapy (Signed)
[place] [address] [place], [place], [address] Phone: [phone]   Fax:  [phone]  Physical Therapy Treatment  Patient Details  Name: Diane Chavez MRN: [id number] Date of Birth: Jul 09, 1955 Referring Provider (PT): [name], MD   Encounter Date: 04/07/2019  PT End of Session - 04/07/19 1653    Visit Number  4    Number of Visits  12    Date for PT Re-Evaluation  05/05/19    PT Start Time  3299    PT Stop Time  1728    PT Time Calculation (min)  34 min       Past Medical History:  Diagnosis Date  . Anxiety   . Crohn disease ([place])    Dr. Laverta [place]  . Depression    on prozac previously  . Hyperlipidemia   . Miscarriage     Past Surgical History:  Procedure Laterality Date  . CARDIAC SURGERY     as a child  . EYE SURGERY  10/30/2009   repair of detached retina  . TONSILLECTOMY  1965    There were no vitals filed for this visit.  Subjective Assessment - 04/07/19 1656    Subjective  "My motion is getting better".  She complains of pain with certain motions.  Overall, happy with progress.    Currently in Pain?  Yes    Pain Score  2     Pain Location  Shoulder    Pain Orientation  Left    Pain Descriptors / Indicators  Dull    Aggravating Factors   certain motions    Pain Relieving Factors  ice/heat         [facility] PT Assessment - 04/07/19 0001      Assessment   Medical Diagnosis  M89.8X1 (ICD-10-CM) - Pain of left scapula    Referring Provider (PT)  [name], MD    Onset Date/Surgical Date  03/19/19   surgery 11/29/2018   Hand Dominance  Left    Next MD Visit  04/18/2019    Prior Therapy  recently following Lt shoulder surgery      AROM   Left Shoulder Extension  50 Degrees    Left Shoulder Flexion  121 Degrees    Left Shoulder ABduction  115 Degrees    Left Shoulder Internal Rotation  --   FIR to T9   Left Shoulder External Rotation  85 Degrees   abdct 80 deg       Strength   Left Shoulder External Rotation  3+/5       [facility] Adult PT Treatment/Exercise - 04/07/19 0001      Shoulder Exercises: Standing   External Rotation  Strengthening;Both;10 reps;Left;5 reps    Theraband Level (Shoulder External Rotation)  Level 1 (Yellow)    Internal Rotation  Strengthening;Left;10 reps;Theraband    Theraband Level (Shoulder Internal Rotation)  Level 1 (Yellow)    Flexion  Strengthening;Left;10 reps   [place] 4- forward punch to 90 deg flexion    Theraband Level (Shoulder Flexion)  Level 1 (Yellow)    Extension  Strengthening;Left;10 reps;Theraband    Theraband Level (Shoulder Extension)  Level 1 (Yellow)    Row  Strengthening;Left;10 reps;Theraband    Theraband Level (Shoulder Row)  Level 2 (Red)      Shoulder Exercises: Pulleys   Flexion  --   10 reps x 10 sec    Scaption  --   10 reps x 10  Shoulder Exercises: Stretch   Corner Stretch  2 reps;30 seconds    Other Shoulder Stretches  bilat bicep stretch holding door frame x 30 sec x 2 reps       Modalities   Modalities  --   pt declined modalities of heat/estim     Iontophoresis   Type of Iontophoresis  Dexamethasone    Location  Lt lateral shoulder (supraspinatus)     Dose  1.0 cc    Time  80 mA stat patch, 6 hr                PT Short Term Goals - 03/24/19 1042      PT SHORT TERM GOAL #1   Title  n/a      PT SHORT TERM GOAL #2   Title  n/a        PT Long Term Goals - 03/24/19 1042      PT LONG TERM GOAL #1   Title  Improve posture and alignment with patient to demonstrate improved upright posture with posterior shoulder girdle engaged    Status  New    Target Date  05/05/19      PT LONG TERM GOAL #2   Title  Increase AROM Lt shoulder to equal Rt shoulder    Status  New    Target Date  05/05/19      PT LONG TERM GOAL #3   Title  Decrease pain with patient to report ability to reach up and behind back with minimal to no pain    Status  New    Target Date   05/05/19      PT LONG TERM GOAL #4   Title  Independent in HEP    Status  New    Target Date  05/05/19      PT LONG TERM GOAL #5   Title  Improve FOTO to </= 28% limitation    Status  New    Target Date  05/05/19            Plan - 04/07/19 1737    Clinical Impression Statement  Pt demonstrated improved Lt shoulder AROM and ER strength.  Pt able to tolerate light resistance exercises with yellow band without increase in symptoms. Progressing well towards stated therapy goals.    Personal Factors and Comorbidities  Past/Current Experience;Comorbidity 3+    Comorbidities  Lt shoulder SAD/DCR, anxiety, depression, migraines    Examination-Activity Limitations  Lift;Hygiene/Grooming;Dressing;Carry;Reach Overhead    Examination-Participation Restrictions  Other;Driving;Laundry;Cleaning    Rehab Potential  Poor    PT Frequency  2x / week    PT Duration  6 weeks    PT Treatment/Interventions  Patient/family education;ADLs/Self Care Home Management;Cryotherapy;Electrical Stimulation;Iontophoresis 70m/ml Dexamethasone;Moist Heat;Ultrasound;Manual techniques;Neuromuscular re-education;Functional mobility training;Therapeutic activities;Therapeutic exercise    PT Next Visit Plan  assess response to ionto; continue progressive strengthening and ROM for Lt shoulder.    PT Home Exercise Plan  Access Code: [email or web address]   Consulted and Agree with Plan of Care  Patient       Patient will benefit from skilled therapeutic intervention in order to improve the following deficits and impairments:  Pain, Increased fascial restricitons, Increased muscle spasms, Impaired UE functional use, Decreased strength, Decreased mobility, Decreased range of motion, Decreased activity tolerance  Visit Diagnosis: Acute pain of left shoulder  Other symptoms and signs involving the musculoskeletal system  Scapular dyskinesis     Problem List Patient Active Problem List   Diagnosis Date Noted  .  Lumbar  degenerative disc disease 10/04/2018  . Acute shoulder bursitis, left 05/26/2018  . Left knee swelling with Baker's cyst 09/30/2017  . Status post cardiac surgery 09/05/2013  . Epiretinal membrane 12/10/2012  . Osteoporosis 10/10/2010  . BENIGN POSITIONAL VERTIGO 04/05/2010  . 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 HEADACHE 07/30/2009  . Headache 07/30/2009  . POSTMENOPAUSAL STATUS 04/25/2009  . ANXIETY 09/20/2007  . Insomnia 09/20/2007   Kerin Perna, PTA 04/07/19 5:47 PM  [place] [address] [place], [place], [address] Phone: [phone]   Fax:  [phone]  Name: Diane Chavez MRN: [id number] Date of Birth: 1956-06-11

## 2019-04-08 DIAGNOSIS — I83891 Varicose veins of right lower extremities with other complications: Secondary | ICD-10-CM | POA: Diagnosis not present

## 2019-04-14 ENCOUNTER — Encounter: Payer: BC Managed Care – PPO | Admitting: Physical Therapy

## 2019-04-18 ENCOUNTER — Ambulatory Visit (INDEPENDENT_AMBULATORY_CARE_PROVIDER_SITE_OTHER): Payer: BC Managed Care – PPO

## 2019-04-18 ENCOUNTER — Encounter: Payer: Self-pay | Admitting: Family Medicine

## 2019-04-18 ENCOUNTER — Other Ambulatory Visit: Payer: Self-pay

## 2019-04-18 ENCOUNTER — Ambulatory Visit (INDEPENDENT_AMBULATORY_CARE_PROVIDER_SITE_OTHER): Payer: BC Managed Care – PPO | Admitting: Family Medicine

## 2019-04-18 VITALS — BP 120/72 | HR 63 | Ht 69.0 in | Wt 120.0 lb

## 2019-04-18 DIAGNOSIS — Z1231 Encounter for screening mammogram for malignant neoplasm of breast: Secondary | ICD-10-CM | POA: Diagnosis not present

## 2019-04-18 DIAGNOSIS — E7800 Pure hypercholesterolemia, unspecified: Secondary | ICD-10-CM | POA: Diagnosis not present

## 2019-04-18 DIAGNOSIS — M25512 Pain in left shoulder: Secondary | ICD-10-CM | POA: Diagnosis not present

## 2019-04-18 DIAGNOSIS — M810 Age-related osteoporosis without current pathological fracture: Secondary | ICD-10-CM

## 2019-04-18 DIAGNOSIS — M542 Cervicalgia: Secondary | ICD-10-CM | POA: Diagnosis not present

## 2019-04-18 DIAGNOSIS — Z0000 Encounter for general adult medical examination without abnormal findings: Secondary | ICD-10-CM

## 2019-04-18 DIAGNOSIS — M19012 Primary osteoarthritis, left shoulder: Secondary | ICD-10-CM | POA: Diagnosis not present

## 2019-04-18 MED ORDER — ORPHENADRINE CITRATE ER 100 MG PO TB12: 100.0000 mg | ORAL_TABLET | Freq: Two times a day (BID) | ORAL | 0 refills | Status: DC | PRN

## 2019-04-18 NOTE — Patient Instructions (Signed)
Health Maintenance, Female Adopting a healthy lifestyle and getting preventive care are important in promoting health and wellness. Ask your health care provider about:  The right schedule for you to have regular tests and exams.  Things you can do on your own to prevent diseases and keep yourself healthy. What should I know about diet, weight, and exercise? Eat a healthy diet   Eat a diet that includes plenty of vegetables, fruits, low-fat dairy products, and lean protein.  Do not eat a lot of foods that are high in solid fats, added sugars, or sodium. Maintain a healthy weight Body mass index (BMI) is used to identify weight problems. It estimates body fat based on height and weight. Your health care provider can help determine your BMI and help you achieve or maintain a healthy weight. Get regular exercise Get regular exercise. This is one of the most important things you can do for your health. Most adults should:  Exercise for at least 150 minutes each week. The exercise should increase your heart rate and make you sweat (moderate-intensity exercise).  Do strengthening exercises at least twice a week. This is in addition to the moderate-intensity exercise.  Spend less time sitting. Even light physical activity can be beneficial. Watch cholesterol and blood lipids Have your blood tested for lipids and cholesterol at 62 years of age, then have this test every 5 years. Have your cholesterol levels checked more often if:  Your lipid or cholesterol levels are high.  You are older than 62 years of age.  You are at high risk for heart disease. What should I know about cancer screening? Depending on your health history and family history, you may need to have cancer screening at various ages. This may include screening for:  Breast cancer.  Cervical cancer.  Colorectal cancer.  Skin cancer.  Lung cancer. What should I know about heart disease, diabetes, and high blood  pressure? Blood pressure and heart disease  High blood pressure causes heart disease and increases the risk of stroke. This is more likely to develop in people who have high blood pressure readings, are of African descent, or are overweight.  Have your blood pressure checked: ? Every 3-5 years if you are 18-39 years of age. ? Every year if you are 40 years old or older. Diabetes Have regular diabetes screenings. This checks your fasting blood sugar level. Have the screening done:  Once every three years after age 40 if you are at a normal weight and have a low risk for diabetes.  More often and at a younger age if you are overweight or have a high risk for diabetes. What should I know about preventing infection? Hepatitis B If you have a higher risk for hepatitis B, you should be screened for this virus. Talk with your health care provider to find out if you are at risk for hepatitis B infection. Hepatitis C Testing is recommended for:  Everyone born from 1945 through 1965.  Anyone with known risk factors for hepatitis C. Sexually transmitted infections (STIs)  Get screened for STIs, including gonorrhea and chlamydia, if: ? You are sexually active and are younger than 62 years of age. ? You are older than 62 years of age and your health care provider tells you that you are at risk for this type of infection. ? Your sexual activity has changed since you were last screened, and you are at increased risk for chlamydia or gonorrhea. Ask your health care provider if   you are at risk.  Ask your health care provider about whether you are at high risk for HIV. Your health care provider may recommend a prescription medicine to help prevent HIV infection. If you choose to take medicine to prevent HIV, you should first get tested for HIV. You should then be tested every 3 months for as long as you are taking the medicine. Pregnancy  If you are about to stop having your period (premenopausal) and  you may become pregnant, seek counseling before you get pregnant.  Take 400 to 800 micrograms (mcg) of folic acid every day if you become pregnant.  Ask for birth control (contraception) if you want to prevent pregnancy. Osteoporosis and menopause Osteoporosis is a disease in which the bones lose minerals and strength with aging. This can result in bone fractures. If you are 65 years old or older, or if you are at risk for osteoporosis and fractures, ask your health care provider if you should:  Be screened for bone loss.  Take a calcium or vitamin D supplement to lower your risk of fractures.  Be given hormone replacement therapy (HRT) to treat symptoms of menopause. Follow these instructions at home: Lifestyle  Do not use any products that contain nicotine or tobacco, such as cigarettes, e-cigarettes, and chewing tobacco. If you need help quitting, ask your health care provider.  Do not use street drugs.  Do not share needles.  Ask your health care provider for help if you need support or information about quitting drugs. Alcohol use  Do not drink alcohol if: ? Your health care provider tells you not to drink. ? You are pregnant, may be pregnant, or are planning to become pregnant.  If you drink alcohol: ? Limit how much you use to 0-1 drink a day. ? Limit intake if you are breastfeeding.  Be aware of how much alcohol is in your drink. In the [place] one drink equals one 12 oz bottle of beer (355 mL), one 5 oz glass of wine (148 mL), or one 1 oz glass of hard liquor (44 mL). General instructions  Schedule regular health, dental, and eye exams.  Stay current with your vaccines.  Tell your health care provider if: ? You often feel depressed. ? You have ever been abused or do not feel safe at home. Summary  Adopting a healthy lifestyle and getting preventive care are important in promoting health and wellness.  Follow your health care provider's instructions about healthy  diet, exercising, and getting tested or screened for diseases.  Follow your health care provider's instructions on monitoring your cholesterol and blood pressure. This information is not intended to replace advice given to you by your health care provider. Make sure you discuss any questions you have with your health care provider. Document Released: 12/16/2010 Document Revised: 05/26/2018 Document Reviewed: 05/26/2018 Elsevier Patient Education  2020 [employer] Inc[employer]  

## 2019-04-18 NOTE — Progress Notes (Signed)
Subjective:     Diane Chavez is a 63 y.o. female and is here for a comprehensive physical exam. The patient reports problems - see below. She is active.    She does complain of left shoulder pain.  She says ever since her surgery it still just continue to bother her and she still continued to have pain.  She also complains of neck pain that started bothering her a few days ago in fact the last 2 nights she is actually taken a muscle relaxer that does seem to help some.  She has Norflex at home.  She has been using heat some as well.  She wonders if it could be related to her shoulder.  She denies any recent injury or trauma.  He does have a follow-up with GI coming up soon.  She says her husband gets on her because he feels like she does not eat enough but she feels like there is a lot of triggers for her GI system.  And she is been having more problems with her stomach recently and stomach pain.  Social History   Socioeconomic History  . Marital status: Married    Spouse name: [name]  . Number of children: 3  . Years of education: Not on file  . Highest education level: Not on file  Occupational History  . 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 Financial resource strain: Not on file  . Food insecurity    Worry: Not on file    Inability: Not on file  . Transportation needs    Medical: Not on file    Non-medical: Not on file  Tobacco Use  . Smoking status: Former Smoker    Quit date: 06/16/1974    Years since quitting: 44.8  . Smokeless tobacco: Never Used  Substance and Sexual Activity  . Alcohol use: Yes    Alcohol/week: 7.0 standard drinks    Types: 7 Glasses of wine per week  . Drug use: No  . Sexual activity: Not on file  Lifestyle  . Physical activity    Days per week: Not on file    Minutes per session: Not on file  . Stress: Not on file  Relationships  . Social Herbalist on phone: Not on file    Gets together: Not on file   Attends religious service: Not on file    Active member of club or organization: Not on file    Attends meetings of clubs or organizations: Not on file    Relationship status: Not on file  . Intimate partner violence    Fear of current or ex partner: Not on file    Emotionally abused: Not on file    Physically abused: Not on file    Forced sexual activity: Not on file  Other Topics Concern  . Not on file  Social History Narrative   Lives with husband.Regular exercise-no.   Health Maintenance  Topic Date Due  . MAMMOGRAM  10/15/2017  . COLONOSCOPY  07/12/2019  . PAP SMEAR-Modifier  09/11/2019  . TETANUS/TDAP  11/01/2019  . INFLUENZA VACCINE  Completed  . Hepatitis C Screening  Completed  . HIV Screening  Completed    The following portions of the patient's history were reviewed and updated as appropriate: allergies, current medications, past family history, past medical history, past social history, past surgical history and problem list.  Review of Systems A comprehensive review of systems was  negative.   Objective:    BP 120/72   Pulse 63   Ht 5' 9"  (1.753 m)   Wt 120 lb (54.4 kg)   LMP 06/11/2000   SpO2 98%   BMI 17.72 kg/m  General appearance: alert, cooperative and appears stated age Head: Normocephalic, without obvious abnormality, atraumatic Eyes: conj clear, EOMi, PEERLA Ears: normal TM's and external ear canals both ears Nose: Nares normal. Septum midline. Mucosa normal. No drainage or sinus tenderness. Throat: lips, mucosa, and tongue normal; teeth and gums normal Neck: no adenopathy, no carotid bruit, no JVD, supple, symmetrical, trachea midline and thyroid not enlarged, symmetric, no tenderness/mass/nodules Back: symmetric, no curvature. ROM normal. No CVA tenderness. Lungs: clear to auscultation bilaterally Breasts: normal appearance, no masses or tenderness Heart: regular rate and rhythm, S1, S2 normal, no murmur, click, rub or gallop Abdomen: soft,  non-tender; bowel sounds normal; no masses,  no organomegaly Extremities: extremities normal, atraumatic, no cyanosis or edema Pulses: 2+ and symmetric Skin: Skin color, texture, turgor normal. No rashes or lesions Lymph nodes: Cervical, supraclavicular, and axillary nodes normal. Neurologic: Alert and oriented X 3, normal strength and tone. Normal symmetric reflexes. Normal coordination and gait    Assessment:    Healthy female exam.     Plan:     See After Visit Summary for Counseling Recommendations   Keep up a regular exercise program and make sure you are eating a healthy diet Try to eat 4 servings of dairy a day, or if you are lactose intolerant take a calcium with vitamin D daily.  Your vaccines are up to date.  She is due for tetanus next year. Due for colonoscopy in January.  She actually has a follow-up with GI next month and so we will asked them about it. Due for pap smear next year.   Due for labs today.  Left shoulder pain -she just finished up physical therapy in October for her left shoulder pain.  Unfortunately just continues to bother her especially after recent fall.  Please see note from the beginning of October about 4 weeks ago.  He just had surgery in June.  We discussed maybe going back and have another consult with the surgeon just to make sure that everything is okay.  We did refill her Norflex.  Cervical pain-given handout with stretches to do on her own for her cervical spine.  Also refilled her muscle relaxer.  Sounds most consistent with musculoskeletal strain.  PT should help.

## 2019-04-19 LAB — COMPLETE METABOLIC PANEL WITH GFR
AG Ratio: 1.9 (calc) (ref 1.0–2.5)
ALT: 9 U/L (ref 6–29)
AST: 16 U/L (ref 10–35)
Albumin: 4.7 g/dL (ref 3.6–5.1)
Alkaline phosphatase (APISO): 74 U/L (ref 37–153)
BUN: 14 mg/dL (ref 7–25)
CO2: 27 mmol/L (ref 20–32)
Calcium: 10.1 mg/dL (ref 8.6–10.4)
Chloride: 102 mmol/L (ref 98–110)
Creat: 0.82 mg/dL (ref 0.50–0.99)
GFR, Est African American: 89 mL/min/{1.73_m2} (ref >=60)
GFR, Est Non African American: 77 mL/min/{1.73_m2} (ref >=60)
Globulin: 2.5 g/dL (calc) (ref 1.9–3.7)
Glucose, Bld: 82 mg/dL (ref 65–99)
Potassium: 4.3 mmol/L (ref 3.5–5.3)
Sodium: 138 mmol/L (ref 135–146)
Total Bilirubin: 0.6 mg/dL (ref 0.2–1.2)
Total Protein: 7.2 g/dL (ref 6.1–8.1)

## 2019-04-19 LAB — CBC
HCT: 42.5 % (ref 35.0–45.0)
Hemoglobin: 14.3 g/dL (ref 11.7–15.5)
MCH: 31.5 pg (ref 27.0–33.0)
MCHC: 33.6 g/dL (ref 32.0–36.0)
MCV: 93.6 fL (ref 80.0–100.0)
MPV: 9.9 fL (ref 7.5–12.5)
Platelets: 213 10*3/uL (ref 140–400)
RBC: 4.54 10*6/uL (ref 3.80–5.10)
RDW: 12.1 % (ref 11.0–15.0)
WBC: 5.9 10*3/uL (ref 3.8–10.8)

## 2019-04-19 LAB — LIPID PANEL
Cholesterol: 238 mg/dL — ABNORMAL HIGH (ref <200)
HDL: 75 mg/dL (ref >=50)
LDL Cholesterol (Calc): 145 mg/dL (calc) — ABNORMAL HIGH
Non-HDL Cholesterol (Calc): 163 mg/dL (calc) — ABNORMAL HIGH (ref <130)
Total CHOL/HDL Ratio: 3.2 (calc) (ref <5.0)
Triglycerides: 78 mg/dL (ref <150)

## 2019-04-21 ENCOUNTER — Other Ambulatory Visit: Payer: Self-pay

## 2019-04-21 ENCOUNTER — Ambulatory Visit (INDEPENDENT_AMBULATORY_CARE_PROVIDER_SITE_OTHER): Payer: BC Managed Care – PPO | Admitting: Physical Therapy

## 2019-04-21 ENCOUNTER — Encounter: Payer: Self-pay | Admitting: Physical Therapy

## 2019-04-21 DIAGNOSIS — M25512 Pain in left shoulder: Secondary | ICD-10-CM | POA: Diagnosis not present

## 2019-04-21 DIAGNOSIS — R29898 Other symptoms and signs involving the musculoskeletal system: Secondary | ICD-10-CM | POA: Diagnosis not present

## 2019-04-21 DIAGNOSIS — G2589 Other specified extrapyramidal and movement disorders: Secondary | ICD-10-CM | POA: Diagnosis not present

## 2019-04-21 NOTE — Therapy (Signed)
[place], [place], [address] Phone: [phone]   Fax:  [phone]  Physical Therapy Treatment  Patient Details  Name: Diane Chavez MRN: [id number] Date of Birth: September 05, 1955 Referring Provider (PT): [name], MD   Encounter Date: 04/21/2019  PT End of Session - 04/21/19 1643    Visit Number  5    Number of Visits  12    Date for PT Re-Evaluation  05/05/19    PT Start Time  1640    PT Stop Time  1726    PT Time Calculation (min)  46 min       Past Medical History:  Diagnosis Date  . Anxiety   . Crohn disease ([place])    Dr. Laverta Baltimore  . Depression    on prozac previously  . Hyperlipidemia   . Miscarriage     Past Surgical History:  Procedure Laterality Date  . CARDIAC SURGERY     as a child  . EYE SURGERY  10/30/2009   repair of detached retina  . TONSILLECTOMY  1965    There were no vitals filed for this visit.  Subjective Assessment - 04/21/19 1829    Subjective  Pt reports she has been away due to stomach issues.  Her shoulder continues to have some pain at times. Her neck has been bothering her since last weekend; she attributes it to looking down at phone/tablet recently.    Currently in Pain?  No/denies    Pain Score  0-No pain         [facility] PT Assessment - 04/21/19 0001      Assessment   Medical Diagnosis  M89.8X1 (ICD-10-CM) - Pain of left scapula    Referring Provider (PT)  [name], MD    Onset Date/Surgical Date  03/19/19   surgery 11/29/2018   Hand Dominance  Left    Next MD Visit  PRN    Prior Therapy  recently following Lt shoulder surgery      AROM   Left Shoulder Extension  51 Degrees    Left Shoulder Flexion  135 Degrees    Left Shoulder ABduction  125 Degrees    Left Shoulder Internal Rotation  --   thumb to T8   Left Shoulder External Rotation  80 Degrees      PROM   Left Shoulder Flexion  156 Degrees    Left Shoulder  External Rotation  85 Degrees      Strength   Left Shoulder Flexion  4-/5    Left Shoulder Extension  5/5    Left Shoulder ABduction  4+/5    Left Shoulder Internal Rotation  4+/5    Left Shoulder External Rotation  4/5    Right/Left Elbow  Left    Left Elbow Flexion  5/5    Left Elbow Extension  5/5                   [facility] Adult PT Treatment/Exercise - 04/21/19 0001      Shoulder Exercises: Supine   External Rotation  AAROM;Left;5 reps   15 sec hold, cane   Theraband Level (Shoulder External Rotation)  Level 1 (Yellow)   x 5 reps for HEP   Flexion  AAROM;Both;5 reps   cane   Diagonals  Strengthening;Left;10 reps;Theraband    Theraband Level (Shoulder Diagonals)  Level 1 (Yellow)      Shoulder Exercises: Seated   Extension  Left;12 reps;Theraband  Theraband Level (Shoulder Extension)  Level 1 (Yellow)    Row  Strengthening;Left;12 reps    Theraband Level (Shoulder Row)  Level 1 (Yellow)    External Rotation  Strengthening;Both;15 reps    Theraband Level (Shoulder External Rotation)  Level 1 (Yellow)    Internal Rotation  Strengthening;Left;10 reps    Theraband Level (Shoulder Internal Rotation)  Level 1 (Yellow)    Flexion  Strengthening;Left;10 reps;5 reps   challenging, increased pain when finished.    Theraband Level (Shoulder Flexion)  Level 1 (Yellow)    Flexion Weight (lbs)  1    Abduction  Strengthening;Left;10 reps;Theraband    Theraband Level (Shoulder ABduction)  Level 1 (Yellow)    ABduction Weight (lbs)  1      Shoulder Exercises: Standing   Flexion  Strengthening;Left;10 reps    Theraband Level (Shoulder Flexion)  Level 1 (Yellow)   Rockwood 4    Diagonals  Strengthening;Left;5 reps    Theraband Level (Shoulder Diagonals)  Level 1 (Yellow)   mirror for form/posture; switched to supine     Shoulder Exercises: Pulleys   Flexion  --   10 reps x 10 sec    Scaption  --   10 reps x 10      Modalities   Modalities  --   decline; will use  heat and estim at home.      Manual Therapy   Soft tissue mobilization  STM to bilat UT, levator, cervical paraspinals, infraspinatus, rhomboid; subocciptial release.                PT Short Term Goals - 03/24/19 1042      PT SHORT TERM GOAL #1   Title  n/a      PT SHORT TERM GOAL #2   Title  n/a        PT Long Term Goals - 04/21/19 1735      PT LONG TERM GOAL #1   Title  Improve posture and alignment with patient to demonstrate improved upright posture with posterior shoulder girdle engaged    Status  On-going      PT LONG TERM GOAL #2   Title  Increase AROM Lt shoulder to equal Rt shoulder    Status  On-going      PT LONG TERM GOAL #3   Title  Decrease pain with patient to report ability to reach up and behind back with minimal to no pain    Status  On-going      PT LONG TERM GOAL #4   Title  Independent in HEP    Status  On-going      PT LONG TERM GOAL #5   Title  Improve FOTO to </= 28% limitation    Status  On-going            Plan - 04/21/19 1735    Clinical Impression Statement  Pt has been away from therapy due to illness.  Pt demonstrated good improvement in Lt shoulder ROM and strength. Limited tolerance for resisted shoulder flexion / scaption with resistance in sitting; will hold off on those for now.  Modified HEP with good tolerance.  Progressing well towards goals.    Personal Factors and Comorbidities  Past/Current Experience;Comorbidity 3+    Comorbidities  Lt shoulder SAD/DCR, anxiety, depression, migraines    Examination-Activity Limitations  Lift;Hygiene/Grooming;Dressing;Carry;Reach Overhead    Examination-Participation Restrictions  Other;Driving;Laundry;Cleaning    Rehab Potential  Poor    PT Frequency  2x / week  PT Duration  6 weeks    PT Treatment/Interventions  Patient/family education;ADLs/Self Care Home Management;Cryotherapy;Electrical Stimulation;Iontophoresis 70m/ml Dexamethasone;Moist Heat;Ultrasound;Manual  techniques;Neuromuscular re-education;Functional mobility training;Therapeutic activities;Therapeutic exercise    PT Next Visit Plan  continue progressive strengthening and ROM for Lt shoulder.    PT Home Exercise Plan  Access Code: [email or web address]   Consulted and Agree with Plan of Care  Patient       Patient will benefit from skilled therapeutic intervention in order to improve the following deficits and impairments:  Pain, Increased fascial restricitons, Increased muscle spasms, Impaired UE functional use, Decreased strength, Decreased mobility, Decreased range of motion, Decreased activity tolerance  Visit Diagnosis: Acute pain of left shoulder  Other symptoms and signs involving the musculoskeletal system  Scapular dyskinesis     Problem List Patient Active Problem List   Diagnosis Date Noted  . Lumbar degenerative disc disease 10/04/2018  . Acute shoulder bursitis, left 05/26/2018  . Left knee swelling with Baker's cyst 09/30/2017  . Status post cardiac surgery 09/05/2013  . Epiretinal membrane 12/10/2012  . Osteoporosis 10/10/2010  . BENIGN POSITIONAL VERTIGO 04/05/2010  . [place] enteritis [place] 07/30/2009  . HEADACHE 07/30/2009  . Headache 07/30/2009  . POSTMENOPAUSAL STATUS 04/25/2009  . ANXIETY 09/20/2007  . Insomnia 09/20/2007    JKerin Perna PTA 04/21/19 6:31 PM   [facility] [address]Phone: [phone]  Fax:  [phone] Name: Diane EVEMRN: [id number] of Birth: 3Oct 28, 1957

## 2019-04-21 NOTE — Patient Instructions (Signed)
Cane Exercise: Flexion    Lie on back, holding cane above chest. Keeping arms as straight as possible, lower cane toward floor beyond head. Hold _5-10___ seconds. Repeat __5__ times. Do ____ sessions per day.      Lie on back, affected arm out from side, elbow at 90, forearm raised.  Use right arm to push cane in left hand towards bed. cane to help return forearm to upright position. _5-10__ reps per set.  Sash   On back, knees bent, feet flat, left hand on left hip, right hand above left. Pull right arm DIAGONALLY (hip to shoulder) across chest. Bring right arm along head toward floor. Hold momentarily. Slowly return to starting position. Repeat __10-20_ times. Do with left arm. Band color ___yellow or red___   Shoulder Rotation: Double Arm   On back, knees bent, feet flat, elbows tucked at sides, bent 90, hands palms up. Pull hands apart and down toward floor, keeping elbows near sides. Hold momentarily. Slowly return to starting position. Repeat _10-20__ times. Band color __yellow____    Low Row: Single Arm   Face anchor in stride stance. Palm up, pull arm back while squeezing shoulder blades together. Repeat 10__ times per set. Repeat with other arm. Do _2-3_ sets per session. Do 3__ sessions per week.  Press: Thumb Up (Single Arm)   Face away from anchor in stride stance, leg forward opposite exercising arm. Press arm forward with thumb up. Repeat 10 times per set.  Do _2-3_ sets per session. Do _3_ sessions per week. Anchor Height: Chest   Rotation: Internal (Single Arm)   Side toward anchor in shoulder width stance with elbow bent to 90, forearm away from body. Thumb up, pull arm across body keeping elbow bent. Repeat _10_ times per set. Repeat with other arm. Do _2-3_ sets per session. Do _3_ sessions per week.  Resisted External Rotation: in Neutral - Bilateral  PALMS UP!!! Sit or stand, tubing in both hands, elbows at sides, bent to 90, forearms forward.  Pinch shoulder blades together and rotate forearms out. Keep elbows at sides. Repeat __10__ times per set. Do __2-3__ sets per session. Do __3-4__ sessions per week.  Resistive Band Rowing   With resistive band anchored in door, grasp both ends. Keeping elbows bent, pull back, squeezing shoulder blades together. Hold _3-5___ seconds. Repeat _10___ times, 2 sets. Do __3__ sessions per week.   Strengthening: Resisted Extension   Hold tubing with both hands, arms forward. Pull arms back, elbow straight. Repeat _10___ times per set. Do _2___ sets per session. Do _3___ sessions per week.

## 2019-04-22 DIAGNOSIS — I83892 Varicose veins of left lower extremities with other complications: Secondary | ICD-10-CM | POA: Diagnosis not present

## 2019-04-26 DIAGNOSIS — I83892 Varicose veins of left lower extremities with other complications: Secondary | ICD-10-CM | POA: Diagnosis not present

## 2019-04-28 ENCOUNTER — Ambulatory Visit (INDEPENDENT_AMBULATORY_CARE_PROVIDER_SITE_OTHER): Payer: BC Managed Care – PPO | Admitting: Physical Therapy

## 2019-04-28 ENCOUNTER — Other Ambulatory Visit: Payer: Self-pay

## 2019-04-28 DIAGNOSIS — M25512 Pain in left shoulder: Secondary | ICD-10-CM

## 2019-04-28 DIAGNOSIS — G2589 Other specified extrapyramidal and movement disorders: Secondary | ICD-10-CM

## 2019-04-28 DIAGNOSIS — R29898 Other symptoms and signs involving the musculoskeletal system: Secondary | ICD-10-CM | POA: Diagnosis not present

## 2019-04-28 NOTE — Patient Instructions (Signed)
Over Head Pull: Narrow Grip     [place] [phone]   On back, knees bent, feet flat, band across thighs, elbows straight but relaxed. Pull hands apart (start). Keeping elbows straight, bring arms up and over head, hands toward floor. Keep pull steady on band. Hold momentarily. Return slowly, keeping pull steady, back to start. Repeat __10_ times. Band color __red ____   Side Pull: Double Arm   On back, knees bent, feet flat. Arms perpendicular to body, shoulder level, elbows straight but relaxed. Pull arms out to sides, elbows straight. Resistance band comes across collarbones, hands toward floor. Hold momentarily. Slowly return to starting position. Repeat _10__ times. Band color __red__   Elmer Picker   On back, knees bent, feet flat, left hand on left hip, right hand above left. Pull right arm DIAGONALLY (hip to shoulder) across chest. Bring right arm along head toward floor. Hold momentarily. Slowly return to starting position. Repeat _10__ times. Do with left arm. Band color __red____   Shoulder Rotation: Double Arm   On back, knees bent, feet flat, elbows tucked at sides, bent 90, hands palms up. Pull hands apart and down toward floor, keeping elbows near sides. Hold momentarily. Slowly return to starting position. Repeat _10__ times. Band color ___red___

## 2019-04-28 NOTE — Therapy (Signed)
[name], [place], [address] Phone: [phone]   Fax:  [phone]  Physical Therapy Treatment  Patient Details  Name: Diane Chavez MRN: [id number] Date of Birth: [birth year] Referring Provider (PT): [name], MD   Encounter Date: 04/28/2019  PT End of Session - 04/28/19 1657    Visit Number  6    Number of Visits  12    Date for PT Re-Evaluation  05/05/19    PT Start Time  9937    PT Stop Time  1736    PT Time Calculation (min)  39 min    Activity Tolerance  Patient tolerated treatment well;No increased pain    Behavior During Therapy  WFL for tasks assessed/performed       Past Medical History:  Diagnosis Date  . Anxiety   . Crohn disease ([place])    Dr. Laverta Baltimore  . Depression    on prozac previously  . Hyperlipidemia   . Miscarriage     Past Surgical History:  Procedure Laterality Date  . CARDIAC SURGERY     as a child  . EYE SURGERY  10/30/2009   repair of detached retina  . TONSILLECTOMY  1965    There were no vitals filed for this visit.  Subjective Assessment - 04/28/19 1751    Subjective  Pt reports she is able to sleep better now.  Her Rt shoulder has been bothering her lately and she is worried it is getting weak.    Patient Stated Goals  improve the pain    Currently in Pain?  No/denies    Pain Score  0-No pain         [facility] PT Assessment - 04/28/19 0001      Assessment   Medical Diagnosis  M89.8X1 (ICD-10-CM) - Pain of left scapula    Referring Provider (PT)  [name], MD    Onset Date/Surgical Date  03/19/19   surgery 11/29/2018   Hand Dominance  Left    Next MD Visit  PRN    Prior Therapy  recently following Lt shoulder surgery      AROM   Left Shoulder Flexion  141 Degrees    Left Shoulder ABduction  144 Degrees    Left Shoulder Internal Rotation  --   thumb to T8   Left Shoulder External Rotation  93 Degrees      Strength   Left Shoulder Flexion  4/5    Left Shoulder Extension  5/5    Left Shoulder ABduction  4/5   with discomfort   Left Shoulder Internal Rotation  --   5-/5   Left Shoulder External Rotation  --   5-/5                  [facility] Adult PT Treatment/Exercise - 04/28/19 0001      Shoulder Exercises: Supine   Horizontal ABduction  Strengthening;Both;10 reps    Theraband Level (Shoulder Horizontal ABduction)  Level 2 (Red)    External Rotation  Strengthening;Both;10 reps    Theraband Level (Shoulder External Rotation)  Level 2 (Red)    Diagonals  Right;Left;10 reps    Theraband Level (Shoulder Diagonals)  Level 2 (Red)    Other Supine Exercises  pt shown bilat overhead flexion with red band for HEP.       Shoulder Exercises: Seated   Row  --    Theraband Level (Shoulder Row)  --  Flexion  --      Shoulder Exercises: Standing   Flexion  Strengthening;Left;Right;10 reps   2# difficult, limited to 5 reps   Shoulder Flexion Weight (lbs)  1,2    ABduction  Strengthening;Left;Right;10 reps;5 reps    Shoulder ABduction Weight (lbs)  1,2   2# difficult, limited to 5 reps   Extension  Strengthening;Both;10 reps    Theraband Level (Shoulder Extension)  Level 2 (Red)    Row  Both;10 reps    Theraband Level (Shoulder Row)  Level 2 (Red)   3 sec pause in retraction   Other Standing Exercises  Rockwood 4 flexion x 10 reps each arm with red band      Shoulder Exercises: Stretch   Other Shoulder Stretches  bilat bicep stretch holding door frame x 30 sec x 2 reps     Other Shoulder Stretches  3 position doorway stretch x 20 sec each position             PT Education - 04/28/19 1732    Education Details  HEP    Person(s) Educated  Patient    Methods  Explanation;Handout;Demonstration    Comprehension  Returned demonstration;Verbalized understanding       PT Short Term Goals - 03/24/19 1042      PT SHORT TERM GOAL #1   Title  n/a      PT SHORT TERM GOAL #2   Title   n/a        PT Long Term Goals - 04/28/19 1750      PT LONG TERM GOAL #1   Title  Improve posture and alignment with patient to demonstrate improved upright posture with posterior shoulder girdle engaged    Status  Achieved      PT LONG TERM GOAL #2   Title  Increase AROM Lt shoulder to equal Rt shoulder    Status  Partially Met      PT LONG TERM GOAL #3   Title  Decrease pain with patient to report ability to reach up and behind back with minimal to no pain    Status  Achieved      PT LONG TERM GOAL #4   Title  Independent in HEP    Status  On-going      PT LONG TERM GOAL #5   Title  Improve FOTO to </= 28% limitation    Status  On-going            Plan - 04/28/19 1748    Clinical Impression Statement  Good improvement in Lt shoulder ROM and strength; HEP modified again to address weak areas.  Improved tolerance for resisted shoulder flexion and scaption, however still weak in these motions on LUE.Able to tolerate increased resistance with all exercises.  Pt has met LTG 1 and 3, partially met LTG 2.  Anticipate holding therapy after next visit.    Personal Factors and Comorbidities  Past/Current Experience;Comorbidity 3+    Comorbidities  Lt shoulder SAD/DCR, anxiety, depression, migraines    Examination-Activity Limitations  Lift;Hygiene/Grooming;Dressing;Carry;Reach Overhead    Examination-Participation Restrictions  Other;Driving;Laundry;Cleaning    Rehab Potential  Poor    PT Frequency  2x / week    PT Duration  6 weeks    PT Treatment/Interventions  Patient/family education;ADLs/Self Care Home Management;Cryotherapy;Electrical Stimulation;Iontophoresis 55m/ml Dexamethasone;Moist Heat;Ultrasound;Manual techniques;Neuromuscular re-education;Functional mobility training;Therapeutic activities;Therapeutic exercise    PT Next Visit Plan  continue progressive strengthening and ROM for Lt shoulder. FOTO, assess readiness to d/c vs hold.  PT Home Exercise Plan  Access  Code: [email or web address]    Consulted and Agree with Plan of Care  Patient       Patient will benefit from skilled therapeutic intervention in order to improve the following deficits and impairments:  Pain, Increased fascial restricitons, Increased muscle spasms, Impaired UE functional use, Decreased strength, Decreased mobility, Decreased range of motion, Decreased activity tolerance  Visit Diagnosis: Acute pain of left shoulder  Other symptoms and signs involving the musculoskeletal system  Scapular dyskinesis     Problem List Patient Active Problem List   Diagnosis Date Noted  . Lumbar degenerative disc disease 10/04/2018  . Acute shoulder bursitis, left 05/26/2018  . Left knee swelling with Baker's cyst 09/30/2017  . Status post cardiac surgery 09/05/2013  . Epiretinal membrane 12/10/2012  . Osteoporosis 10/10/2010  . BENIGN POSITIONAL VERTIGO 04/05/2010  . 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 HEADACHE 07/30/2009  . Headache 07/30/2009  . POSTMENOPAUSAL STATUS 04/25/2009  . ANXIETY 09/20/2007  . Insomnia 09/20/2007   Kerin Perna, PTA 04/28/19 5:56 PM  [place], [place], [address] Phone: [phone]   Fax:  [phone]  Name: CARRA BRINDLEY MRN: [id number] Date of Birth: [birth year]

## 2019-05-02 DIAGNOSIS — Z79899 Other long term (current) drug therapy: Secondary | ICD-10-CM | POA: Diagnosis not present

## 2019-05-02 DIAGNOSIS — K219 Gastro-esophageal reflux disease without esophagitis: Secondary | ICD-10-CM | POA: Diagnosis not present

## 2019-05-02 DIAGNOSIS — K5100 Ulcerative (chronic) pancolitis without complications: Secondary | ICD-10-CM | POA: Diagnosis not present

## 2019-05-05 ENCOUNTER — Other Ambulatory Visit: Payer: Self-pay

## 2019-05-05 ENCOUNTER — Ambulatory Visit (INDEPENDENT_AMBULATORY_CARE_PROVIDER_SITE_OTHER): Payer: BC Managed Care – PPO | Admitting: Physical Therapy

## 2019-05-05 DIAGNOSIS — M25512 Pain in left shoulder: Secondary | ICD-10-CM

## 2019-05-05 DIAGNOSIS — G2589 Other specified extrapyramidal and movement disorders: Secondary | ICD-10-CM

## 2019-05-05 DIAGNOSIS — R29898 Other symptoms and signs involving the musculoskeletal system: Secondary | ICD-10-CM | POA: Diagnosis not present

## 2019-05-05 NOTE — Therapy (Addendum)
[place], [place], [address] Phone: [phone]   Fax:  [phone]  Physical Therapy Treatment  Patient Details  Name: Diane Chavez MRN: [id number] Date of Birth: [birth year] Referring Provider (PT): [name], MD   Encounter Date: 05/05/2019  PT End of Session - 05/05/19 1720    Visit Number  7    Number of Visits  12    Date for PT Re-Evaluation  05/05/19    PT Start Time  5009    PT Stop Time  3818    PT Time Calculation (min)  48 min    Activity Tolerance  Patient tolerated treatment well;No increased pain    Behavior During Therapy  WFL for tasks assessed/performed       Past Medical History:  Diagnosis Date  . Anxiety   . Crohn disease ([place])    Dr. Laverta Baltimore  . Depression    on prozac previously  . Hyperlipidemia   . Miscarriage     Past Surgical History:  Procedure Laterality Date  . CARDIAC SURGERY     as a child  . EYE SURGERY  10/30/2009   repair of detached retina  . TONSILLECTOMY  1965    There were no vitals filed for this visit.  Subjective Assessment - 05/05/19 1653    Subjective  Pt reports she realized if she exercises too close to bedtime, she doesn't sleep well.  Her Rt shoulder has been bothering her with reaching overhead and when she sleeps on it.  Lt continues to improve daily.    Currently in Pain?  Yes    Pain Score  2     Pain Location  Shoulder    Pain Orientation  Left;Right    Pain Descriptors / Indicators  Dull    Aggravating Factors   certain motions    Pain Relieving Factors  ice/heat         [facility] PT Assessment - 05/05/19 0001      Assessment   Medical Diagnosis  M89.8X1 (ICD-10-CM) - Pain of left scapula    Referring Provider (PT)  [name], MD    Onset Date/Surgical Date  03/19/19   surgery 11/29/2018   Hand Dominance  Left    Next MD Visit  PRN    Prior Therapy  recently following Lt shoulder surgery      AROM   Left Shoulder Flexion  139 Degrees   standing   Left Shoulder ABduction  139 Degrees      PROM   Left Shoulder Flexion  156 Degrees   supine     Strength   Left Shoulder Flexion  --   5-/5   Left Shoulder ABduction  4/5   with discomfort        [facility] Adult PT Treatment/Exercise - 05/05/19 0001      Shoulder Exercises: Supine   Horizontal ABduction  Strengthening;Both;10 reps   2 sets   Theraband Level (Shoulder Horizontal ABduction)  Level 1 (Yellow);Level 2 (Red)    External Rotation  Strengthening;Both;15 reps;Theraband    Theraband Level (Shoulder External Rotation)  Level 1 (Yellow)    Flexion  Strengthening;Both;10 reps   bilat overhead pull   Theraband Level (Shoulder Flexion)  Level 1 (Yellow)    Diagonals  Right;Left;Strengthening;10 reps;Theraband    Theraband Level (Shoulder Diagonals)  Level 2 (Red)      Shoulder Exercises: Standing   Flexion  Strengthening;Right;Left;10 reps  Theraband Level (Shoulder Flexion)  Level 1 (Yellow)    ABduction  Strengthening;Right;Left;10 reps    Theraband Level (Shoulder ABduction)  Level 1 (Yellow)    Extension  Strengthening;Both;15 reps    Theraband Level (Shoulder Extension)  Level 2 (Red)    Row  Strengthening;Both;15 reps    Theraband Level (Shoulder Row)  Level 2 (Red)    Other Standing Exercises  bicep curl with yellow band x 10 each arm.       Shoulder Exercises: ROM/Strengthening   UBE (Upper Arm Bike)  L1: 1 min each direction       Shoulder Exercises: Stretch   Other Shoulder Stretches  3 position doorway stretch x 20 sec each position x 2       Modalities   Modalities  Moist Heat      Moist Heat Therapy   Number Minutes Moist Heat  --   during supine exercises   Moist Heat Location  Cervical      Manual Therapy   Soft tissue mobilization  STM to bilat upper trap, levator, bilat cervical paraspinals to reduce tightness and improve ROM.                PT Short Term Goals - 03/24/19  1042      PT SHORT TERM GOAL #1   Title  n/a      PT SHORT TERM GOAL #2   Title  n/a        PT Long Term Goals - 04/28/19 1750      PT LONG TERM GOAL #1   Title  Improve posture and alignment with patient to demonstrate improved upright posture with posterior shoulder girdle engaged    Status  Achieved      PT LONG TERM GOAL #2   Title  Increase AROM Lt shoulder to equal Rt shoulder    Status  Partially Met      PT LONG TERM GOAL #3   Title  Decrease pain with patient to report ability to reach up and behind back with minimal to no pain    Status  Achieved      PT LONG TERM GOAL #4   Title  Independent in HEP    Status  On-going      PT LONG TERM GOAL #5   Title  Improve FOTO to </= 28% limitation    Status  On-going            Plan - 05/05/19 1759    Clinical Impression Statement  Pt's Lt shoulder ROM measured slightly less than last week, however her strength has improved in Lt shoulder.FOTO score improved to 35% limited (52% limited at intake).  Pt has partially met her goals and verbalized readiness to hold therapy while she continues to work on [employer].    Personal Factors and Comorbidities  Past/Current Experience;Comorbidity 3+    Comorbidities  Lt shoulder SAD/DCR, anxiety, depression, migraines    Examination-Activity Limitations  Lift;Hygiene/Grooming;Dressing;Carry;Reach Overhead    Examination-Participation Restrictions  Other;Driving;Laundry;Cleaning    Rehab Potential  Poor    PT Frequency  2x / week    PT Duration  6 weeks    PT Treatment/Interventions  Patient/family education;ADLs/Self Care Home Management;Cryotherapy;Electrical Stimulation;Iontophoresis 63m/ml Dexamethasone;Moist Heat;Ultrasound;Manual techniques;Neuromuscular re-education;Functional mobility training;Therapeutic activities;Therapeutic exercise    PT Next Visit Plan  spoke to supervising PT; will hold until 05/31/19.    PT Home Exercise Plan  Access Code: [email or web address]   Consulted and  Agree with Plan  of Care  Patient       Patient will benefit from skilled therapeutic intervention in order to improve the following deficits and impairments:  Pain, Increased fascial restricitons, Increased muscle spasms, Impaired UE functional use, Decreased strength, Decreased mobility, Decreased range of motion, Decreased activity tolerance  Visit Diagnosis: Acute pain of left shoulder  Other symptoms and signs involving the musculoskeletal system  Scapular dyskinesis     Problem List Patient Active Problem List   Diagnosis Date Noted  . Lumbar degenerative disc disease 10/04/2018  . Acute shoulder bursitis, left 05/26/2018  . Left knee swelling with Baker's cyst 09/30/2017  . Status post cardiac surgery 09/05/2013  . Epiretinal membrane 12/10/2012  . Osteoporosis 10/10/2010  . BENIGN POSITIONAL VERTIGO 04/05/2010  . 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 HEADACHE 07/30/2009  . Headache 07/30/2009  . POSTMENOPAUSAL STATUS 04/25/2009  . ANXIETY 09/20/2007  . Insomnia 09/20/2007   Kerin Perna, PTA 05/05/19 6:11 PM  [facility] [place], [place], [address] Phone: [phone]   Fax:  [phone]  Name: Diane Chavez MRN: [id number] Date of Birth: Oct 21, 1955  PHYSICAL THERAPY DISCHARGE SUMMARY  Visits from Start of Care: 7  Current functional level related to goals / functional outcomes: See progress note for discharge status    Remaining deficits: Unknown    Education / Equipment: HEP  Plan: Patient agrees to discharge.  Patient goals were partially met. Patient is being discharged due to being pleased with the current functional level.  ?????     Celyn P. Helene Kelp PT, MPH 09/12/19 3:16 PM

## 2019-05-16 ENCOUNTER — Ambulatory Visit: Payer: BC Managed Care – PPO | Admitting: Family Medicine

## 2019-05-16 DIAGNOSIS — I83891 Varicose veins of right lower extremities with other complications: Secondary | ICD-10-CM | POA: Diagnosis not present

## 2019-05-25 ENCOUNTER — Ambulatory Visit (INDEPENDENT_AMBULATORY_CARE_PROVIDER_SITE_OTHER): Payer: BLUE CROSS/BLUE SHIELD

## 2019-05-25 ENCOUNTER — Other Ambulatory Visit: Payer: Self-pay

## 2019-05-25 DIAGNOSIS — M8589 Other specified disorders of bone density and structure, multiple sites: Secondary | ICD-10-CM | POA: Diagnosis not present

## 2019-05-25 DIAGNOSIS — Z1231 Encounter for screening mammogram for malignant neoplasm of breast: Secondary | ICD-10-CM

## 2019-05-25 DIAGNOSIS — M810 Age-related osteoporosis without current pathological fracture: Secondary | ICD-10-CM | POA: Diagnosis not present

## 2019-05-25 DIAGNOSIS — Z780 Asymptomatic menopausal state: Secondary | ICD-10-CM | POA: Diagnosis not present

## 2019-06-28 DIAGNOSIS — I83891 Varicose veins of right lower extremities with other complications: Secondary | ICD-10-CM | POA: Diagnosis not present

## 2019-07-07 ENCOUNTER — Other Ambulatory Visit: Payer: BC Managed Care – PPO

## 2019-07-07 ENCOUNTER — Ambulatory Visit: Payer: BC Managed Care – PPO

## 2019-08-08 DIAGNOSIS — K5190 Ulcerative colitis, unspecified, without complications: Secondary | ICD-10-CM | POA: Diagnosis not present

## 2019-08-08 DIAGNOSIS — Z1211 Encounter for screening for malignant neoplasm of colon: Secondary | ICD-10-CM | POA: Diagnosis not present

## 2019-08-08 LAB — HM COLONOSCOPY

## 2019-08-21 ENCOUNTER — Encounter: Payer: Self-pay | Admitting: Family Medicine

## 2019-12-26 DIAGNOSIS — M9903 Segmental and somatic dysfunction of lumbar region: Secondary | ICD-10-CM | POA: Diagnosis not present

## 2019-12-26 DIAGNOSIS — M9905 Segmental and somatic dysfunction of pelvic region: Secondary | ICD-10-CM | POA: Diagnosis not present

## 2019-12-26 DIAGNOSIS — M545 Low back pain: Secondary | ICD-10-CM | POA: Diagnosis not present

## 2019-12-26 DIAGNOSIS — M25551 Pain in right hip: Secondary | ICD-10-CM | POA: Diagnosis not present

## 2019-12-27 DIAGNOSIS — M25512 Pain in left shoulder: Secondary | ICD-10-CM | POA: Diagnosis not present

## 2019-12-27 DIAGNOSIS — M25519 Pain in unspecified shoulder: Secondary | ICD-10-CM | POA: Diagnosis not present

## 2020-01-13 DIAGNOSIS — M545 Low back pain: Secondary | ICD-10-CM | POA: Diagnosis not present

## 2020-01-13 DIAGNOSIS — M25551 Pain in right hip: Secondary | ICD-10-CM | POA: Diagnosis not present

## 2020-01-13 DIAGNOSIS — M9903 Segmental and somatic dysfunction of lumbar region: Secondary | ICD-10-CM | POA: Diagnosis not present

## 2020-01-13 DIAGNOSIS — M9905 Segmental and somatic dysfunction of pelvic region: Secondary | ICD-10-CM | POA: Diagnosis not present

## 2020-01-20 DIAGNOSIS — M9903 Segmental and somatic dysfunction of lumbar region: Secondary | ICD-10-CM | POA: Diagnosis not present

## 2020-01-20 DIAGNOSIS — M9905 Segmental and somatic dysfunction of pelvic region: Secondary | ICD-10-CM | POA: Diagnosis not present

## 2020-01-20 DIAGNOSIS — M25551 Pain in right hip: Secondary | ICD-10-CM | POA: Diagnosis not present

## 2020-01-20 DIAGNOSIS — M545 Low back pain: Secondary | ICD-10-CM | POA: Diagnosis not present

## 2020-01-27 DIAGNOSIS — M545 Low back pain: Secondary | ICD-10-CM | POA: Diagnosis not present

## 2020-01-27 DIAGNOSIS — M9905 Segmental and somatic dysfunction of pelvic region: Secondary | ICD-10-CM | POA: Diagnosis not present

## 2020-01-27 DIAGNOSIS — M9903 Segmental and somatic dysfunction of lumbar region: Secondary | ICD-10-CM | POA: Diagnosis not present

## 2020-01-27 DIAGNOSIS — M25551 Pain in right hip: Secondary | ICD-10-CM | POA: Diagnosis not present

## 2020-02-29 ENCOUNTER — Telehealth: Payer: Self-pay | Admitting: Family Medicine

## 2020-02-29 NOTE — Telephone Encounter (Signed)
Left voicemail for patient with this information.

## 2020-02-29 NOTE — Telephone Encounter (Signed)
Please call patient and let her know that has been 5 years since her last Pap smear so we need to get her in and scheduled for a physical with Pap at her convenience.

## 2020-04-11 ENCOUNTER — Telehealth: Payer: Self-pay

## 2020-04-11 DIAGNOSIS — M5136 Other intervertebral disc degeneration, lumbar region: Secondary | ICD-10-CM

## 2020-04-11 DIAGNOSIS — M542 Cervicalgia: Secondary | ICD-10-CM

## 2020-04-11 DIAGNOSIS — M898X1 Other specified disorders of bone, shoulder: Secondary | ICD-10-CM

## 2020-04-11 MED ORDER — AMBULATORY NON FORMULARY MEDICATION: 0 refills | Status: DC

## 2020-04-11 NOTE — Telephone Encounter (Signed)
RX pended 

## 2020-04-11 NOTE — Telephone Encounter (Signed)
Request sent from husband via MyChart:  " [name], MD 5 hours ago (8:38 AM)  It should indicate that due to ongoing or chronic shoulder and neck pain that you're prescribing a hot tub to help alleviate the pain. Or something similar to that wording. Thank you! I can come by to pick up the document.    [name] and [name] 5 hours ago (8:31 AM)   Dr Madilyn Fireman states the following:  "Hi [name], I don't mind at all. Just find out if it has to have specific wording, etc. We don't have prescription pads anymore but print on watermarked prescription paper. "   [name], MD routed conversation to Fredda Hammed (9:39 AM)  [name], MD Yesterday (9:39 AM)     HI [name], I don't mind at all.  Just find out if it has to have specific wording, etc. We don't have prescription pads anymore but print on watermarked prescription paper.        Documentation   You  [name], MD Yesterday (8:16 AM)   For provider review. Will this need to be discussed/addressed at visit?   Message text   [name], [name], MD 3 days ago  Hi Dr Madilyn Fireman,  Diane Chavez has her annual check up e With you on November 3rd.  As you may know,  Dr. [name] recommended shoulder surgery  for last Summer. [name] performed the surgery. It went pretty well. [name] continues to have pain in her neck and shoulder.       The state will allow a tax exempt purchase on a hot tub with a written prescription fro our primary doctor on an RX pad. A DEA or License number as well as NPI number are required by the state. This would save [name] almost $400 in tax on the purchase. We hope to make the purchase this week. Is this something you can help [name] with? Thanks for your time.  Sincerely, [name] and [name] 336 [phone]

## 2020-04-11 NOTE — Telephone Encounter (Signed)
Printed. Thank you.  They can pick up anytime

## 2020-04-13 NOTE — Telephone Encounter (Signed)
Patients husband advised, will collect script today

## 2020-04-23 ENCOUNTER — Encounter: Payer: Self-pay | Admitting: Family Medicine

## 2020-04-23 ENCOUNTER — Other Ambulatory Visit: Payer: Self-pay

## 2020-04-23 ENCOUNTER — Encounter: Payer: BLUE CROSS/BLUE SHIELD | Admitting: Family Medicine

## 2020-04-23 ENCOUNTER — Ambulatory Visit (INDEPENDENT_AMBULATORY_CARE_PROVIDER_SITE_OTHER): Payer: BLUE CROSS/BLUE SHIELD | Admitting: Family Medicine

## 2020-04-23 ENCOUNTER — Other Ambulatory Visit (HOSPITAL_COMMUNITY)
Admission: RE | Admit: 2020-04-23 | Discharge: 2020-04-23 | Disposition: A | Discharge status: Home / Self Care | Payer: BLUE CROSS/BLUE SHIELD | Source: Ambulatory Visit | Attending: Family Medicine | Admitting: Family Medicine

## 2020-04-23 VITALS — BP 120/64 | HR 64 | Ht 69.0 in | Wt 124.0 lb

## 2020-04-23 DIAGNOSIS — Z23 Encounter for immunization: Secondary | ICD-10-CM | POA: Diagnosis not present

## 2020-04-23 DIAGNOSIS — Z0000 Encounter for general adult medical examination without abnormal findings: Secondary | ICD-10-CM

## 2020-04-23 DIAGNOSIS — Z124 Encounter for screening for malignant neoplasm of cervix: Secondary | ICD-10-CM

## 2020-04-23 DIAGNOSIS — L820 Inflamed seborrheic keratosis: Secondary | ICD-10-CM

## 2020-04-23 NOTE — Progress Notes (Addendum)
Subjective:     Diane Chavez is a 64 y.o. female and is here for a comprehensive physical exam. The patient reports no problems.  Pap smear 5 years ago.  She is doing well otherwise.  She does have a hyperkeratotic skin lesion on the right facial cheek in front of her ear that she is noticed for the last month or 2.  She says that it gets a little irritated if she actually bumps it but otherwise is not painful or bothersome..    Social History   Socioeconomic History  . Marital status: Married    Spouse name: [name]  . Number of children: 3  . Years of education: Not on file  . Highest education level: Not on file  Occupational History  . Occupation: Network engineer    Comment: [employer]  Tobacco Use  . Smoking status: Former Smoker    Quit date: 06/16/1974    Years since quitting: 45.8  . Smokeless tobacco: Never Used  Substance and Sexual Activity  . Alcohol use: Yes    Alcohol/week: 7.0 standard drinks    Types: 7 Glasses of wine per week  . Drug use: No  . Sexual activity: Not on file  Other Topics Concern  . Not on file  Social History Narrative   Lives with husband.Regular exercise-no.   Social Determinants of Health   Financial Resource Strain:   . Difficulty of Paying Living Expenses: Not on file  Food Insecurity:   . Worried About Charity fundraiser in the Last Year: Not on file  . Ran Out of Food in the Last Year: Not on file  Transportation Needs:   . Lack of Transportation (Medical): Not on file  . Lack of Transportation (Non-Medical): Not on file  Physical Activity:   . Days of Exercise per Week: Not on file  . Minutes of Exercise per Session: Not on file  Stress:   . Feeling of Stress : Not on file  Social Connections:   . Frequency of Communication with Friends and Family: Not on file  . Frequency of Social Gatherings with Friends and Family: Not on file  . Attends Religious Services: Not on file  . Active Member of Clubs or Organizations: Not on  file  . Attends Archivist Meetings: Not on file  . Marital Status: Not on file  Intimate Partner Violence:   . Fear of Current or Ex-Partner: Not on file  . Emotionally Abused: Not on file  . Physically Abused: Not on file  . Sexually Abused: Not on file   Health Maintenance  Topic Date Due  . PAP SMEAR-Modifier  09/11/2019  . MAMMOGRAM  05/24/2020  . COLONOSCOPY  08/07/2029  . TETANUS/TDAP  04/23/2030  . INFLUENZA VACCINE  Completed  . COVID-19 Vaccine  Completed  . Hepatitis C Screening  Completed  . HIV Screening  Completed    The following portions of the patient's history were reviewed and updated as appropriate: allergies, current medications, past family history, past medical history, past social history, past surgical history and problem list.  Review of Systems A comprehensive review of systems was negative.   Objective:    BP 120/64   Pulse 64   Ht 5' 9"  (1.753 m)   Wt 124 lb (56.2 kg)   LMP 06/11/2000   SpO2 97%   BMI 18.31 kg/m  General appearance: alert, cooperative and appears stated age Head: Normocephalic, without obvious abnormality, atraumatic Eyes: conjunctivae/corneas clear. PERRL, EOM's  intact. Fundi benign. Ears: normal TM's and external ear canals both ears Nose: Nares normal. Septum midline. Mucosa normal. No drainage or sinus tenderness. Throat: lips, mucosa, and tongue normal; teeth and gums normal Neck: no adenopathy, no carotid bruit, no JVD, supple, symmetrical, trachea midline and thyroid not enlarged, symmetric, no tenderness/mass/nodules Back: symmetric, no curvature. ROM normal. No CVA tenderness. Lungs: clear to auscultation bilaterally Breasts: normal appearance, no masses or tenderness Heart: Regular rate and rhythm with 2 out of 6 systolic murmur with a harsh S2. Abdomen: soft, non-tender; bowel sounds normal; no masses,  no organomegaly Pelvic: cervix normal in appearance, external genitalia normal, no adnexal masses or  tenderness, no cervical motion tenderness, rectovaginal septum normal, uterus normal size, shape, and consistency and vagina normal without discharge Extremities: extremities normal, atraumatic, no cyanosis or edema Pulses: 2+ and symmetric Skin: Skin color, texture, turgor normal. No rashes or lesions Lymph nodes: Cervical, supraclavicular, and axillary nodes normal. Neurologic: Alert and oriented X 3, normal strength and tone. Normal symmetric reflexes. Normal coordination and gait    Assessment:    Healthy female exam.      Plan:     See After Visit Summary for Counseling Recommendations   Keep up a regular exercise program and make sure you are eating a healthy diet Try to eat 4 servings of dairy a day, or if you are lactose intolerant take a calcium with vitamin D daily.  Your vaccines are up to date.  Tdap and shingles vaccine given today.  When to follow-up in 2 to 6 months for second shingles vaccine. Pap Smear performed today.  Lesion on right facial cheek is most consistent with a seborrheic keratosis.  Definitive treatment offered with cryotherapy.  Patient tolerated procedure well.  Lesion recurs then will need shave biopsy for further work-up.  Cryotherapy Procedure Note  Pre-operative Diagnosis: seb keratoses  Post-operative Diagnosis: same  Locations: right facial cheek  Indications: irritated   Anesthesia: not required    Procedure Details  Patient informed of risks (permanent scarring, infection, light or dark discoloration, bleeding, infection, weakness, numbness and recurrence of the lesion) and benefits of the procedure and verbal informed consent obtained.  The areas are treated with liquid nitrogen therapy, frozen until ice ball extended 8-9 mm beyond lesion, allowed to thaw, and treated again. The patient tolerated procedure well.  The patient was instructed on post-op care, warned that there may be blister formation, redness and pain. Recommend OTC  analgesia as needed for pain.  Condition: Stable  Complications: none.  Plan: 1. Instructed to keep the area dry and covered for 24-48h and clean thereafter. 2. Warning signs of infection were reviewed.   3. Recommended that the patient use OTC acetaminophen as needed for pain.  4. Return PRN

## 2020-04-24 LAB — CYTOLOGY - PAP
Comment: NEGATIVE
Diagnosis: NEGATIVE
High risk HPV: NEGATIVE

## 2020-04-26 DIAGNOSIS — Z0000 Encounter for general adult medical examination without abnormal findings: Secondary | ICD-10-CM | POA: Diagnosis not present

## 2020-04-27 LAB — COMPLETE METABOLIC PANEL WITH GFR
AG Ratio: 2 (calc) (ref 1.0–2.5)
ALT: 13 U/L (ref 6–29)
AST: 20 U/L (ref 10–35)
Albumin: 4.7 g/dL (ref 3.6–5.1)
Alkaline phosphatase (APISO): 65 U/L (ref 37–153)
BUN: 10 mg/dL (ref 7–25)
CO2: 30 mmol/L (ref 20–32)
Calcium: 9.8 mg/dL (ref 8.6–10.4)
Chloride: 100 mmol/L (ref 98–110)
Creat: 0.87 mg/dL (ref 0.50–0.99)
GFR, Est African American: 82 mL/min/{1.73_m2} (ref >=60)
GFR, Est Non African American: 71 mL/min/{1.73_m2} (ref >=60)
Globulin: 2.3 g/dL (calc) (ref 1.9–3.7)
Glucose, Bld: 82 mg/dL (ref 65–99)
Potassium: 4.3 mmol/L (ref 3.5–5.3)
Sodium: 137 mmol/L (ref 135–146)
Total Bilirubin: 0.6 mg/dL (ref 0.2–1.2)
Total Protein: 7 g/dL (ref 6.1–8.1)

## 2020-04-27 LAB — CBC
HCT: 41.8 % (ref 35.0–45.0)
Hemoglobin: 13.4 g/dL (ref 11.7–15.5)
MCH: 30.2 pg (ref 27.0–33.0)
MCHC: 32.1 g/dL (ref 32.0–36.0)
MCV: 94.1 fL (ref 80.0–100.0)
MPV: 9.8 fL (ref 7.5–12.5)
Platelets: 220 10*3/uL (ref 140–400)
RBC: 4.44 10*6/uL (ref 3.80–5.10)
RDW: 11.9 % (ref 11.0–15.0)
WBC: 4.2 10*3/uL (ref 3.8–10.8)

## 2020-04-27 LAB — LIPID PANEL
Cholesterol: 221 mg/dL — ABNORMAL HIGH (ref <200)
HDL: 78 mg/dL (ref >=50)
LDL Cholesterol (Calc): 127 mg/dL (calc) — ABNORMAL HIGH
Non-HDL Cholesterol (Calc): 143 mg/dL (calc) — ABNORMAL HIGH (ref <130)
Total CHOL/HDL Ratio: 2.8 (calc) (ref <5.0)
Triglycerides: 67 mg/dL (ref <150)

## 2020-04-27 LAB — TSH: TSH: 0.67 mIU/L (ref 0.40–4.50)

## 2020-09-05 DIAGNOSIS — H524 Presbyopia: Secondary | ICD-10-CM | POA: Diagnosis not present

## 2020-09-05 DIAGNOSIS — H33011 Retinal detachment with single break, right eye: Secondary | ICD-10-CM | POA: Diagnosis not present

## 2020-09-19 DIAGNOSIS — H35411 Lattice degeneration of retina, right eye: Secondary | ICD-10-CM | POA: Diagnosis not present

## 2020-09-19 DIAGNOSIS — H43811 Vitreous degeneration, right eye: Secondary | ICD-10-CM | POA: Diagnosis not present

## 2020-09-19 DIAGNOSIS — H35372 Puckering of macula, left eye: Secondary | ICD-10-CM | POA: Diagnosis not present

## 2020-09-19 DIAGNOSIS — H33321 Round hole, right eye: Secondary | ICD-10-CM | POA: Diagnosis not present

## 2020-10-31 DIAGNOSIS — H33321 Round hole, right eye: Secondary | ICD-10-CM | POA: Diagnosis not present

## 2020-10-31 DIAGNOSIS — H43811 Vitreous degeneration, right eye: Secondary | ICD-10-CM | POA: Diagnosis not present

## 2020-10-31 DIAGNOSIS — H35411 Lattice degeneration of retina, right eye: Secondary | ICD-10-CM | POA: Diagnosis not present

## 2020-10-31 DIAGNOSIS — H35372 Puckering of macula, left eye: Secondary | ICD-10-CM | POA: Diagnosis not present

## 2020-11-13 DIAGNOSIS — K5100 Ulcerative (chronic) pancolitis without complications: Secondary | ICD-10-CM | POA: Diagnosis not present

## 2021-01-31 DIAGNOSIS — H35372 Puckering of macula, left eye: Secondary | ICD-10-CM | POA: Diagnosis not present

## 2021-01-31 DIAGNOSIS — H31093 Other chorioretinal scars, bilateral: Secondary | ICD-10-CM | POA: Diagnosis not present

## 2021-01-31 DIAGNOSIS — H35411 Lattice degeneration of retina, right eye: Secondary | ICD-10-CM | POA: Diagnosis not present

## 2021-01-31 DIAGNOSIS — H43811 Vitreous degeneration, right eye: Secondary | ICD-10-CM | POA: Diagnosis not present

## 2021-02-05 ENCOUNTER — Encounter: Payer: Self-pay | Admitting: Family Medicine

## 2021-02-05 ENCOUNTER — Other Ambulatory Visit: Payer: Self-pay

## 2021-02-05 ENCOUNTER — Ambulatory Visit: Payer: BLUE CROSS/BLUE SHIELD | Admitting: Family Medicine

## 2021-02-05 VITALS — BP 106/58 | HR 60 | Temp 98.7°F | Ht 69.0 in | Wt 124.0 lb

## 2021-02-05 DIAGNOSIS — Z23 Encounter for immunization: Secondary | ICD-10-CM | POA: Diagnosis not present

## 2021-02-05 DIAGNOSIS — Z1231 Encounter for screening mammogram for malignant neoplasm of breast: Secondary | ICD-10-CM | POA: Diagnosis not present

## 2021-02-05 DIAGNOSIS — R413 Other amnesia: Secondary | ICD-10-CM | POA: Diagnosis not present

## 2021-02-05 NOTE — Patient Instructions (Signed)
Pneumococcal Conjugate Vaccine (PCV13): What You Need to Know 1. Why get vaccinated? Pneumococcal conjugate vaccine (PCV13) can prevent pneumococcal disease. Pneumococcal disease refers to any illness caused by pneumococcal bacteria. These bacteria can cause many types of illnesses, including pneumonia, which is an infection ofthe lungs. Pneumococcal bacteria are one of the most common causes of pneumonia. Besides pneumonia, pneumococcal bacteria can also cause: Ear infections Sinus infections Meningitis (infection of the tissue covering the brain and spinal cord) Bacteremia (infection of the blood) Anyone can get pneumococcal disease, but children under 42 years old, people with certain medical conditions, adults 47 years or older, and cigarettesmokers are at the highest risk. Most pneumococcal infections are mild. However, some can result in long-term problems, such as brain damage or hearing loss. Meningitis, bacteremia, andpneumonia caused by pneumococcal disease can be fatal. 2. PCV13 PCV13 protects against 13 types of bacteria that cause pneumococcal disease. Infants and young children usually need 4 doses of pneumococcal conjugate vaccine, at ages 59, 74, 42, and 12-15 months. Older children (through age 68 months) may be vaccinated if they did not receive the recommended doses. A dose of PCV13 is also recommended for adults and children 6 years or olderwith certain medical conditions if they did not already receive PCV13. This vaccine may be given to healthy adults 83 years or older who did not already receive PCV13, based on discussions between the patientand health care provider. 3. Talk with your health care provider Tell your vaccination provider if the person getting the vaccine: Has had an allergic reaction after a previous dose of PCV13, to an earlier pneumococcal conjugate vaccine known as PCV7, or to any vaccine containing diphtheria toxoid (for example, DTaP), or has any severe,  life-threatening allergies In some cases, your health care provider may decide to postpone PCV13vaccination until a future visit. People with minor illnesses, such as a cold, may be vaccinated. People who are moderately or severely ill should usually wait until they recover beforegetting PCV13. Your health care provider can give you more information. 4. Risks of a vaccine reaction Redness, swelling, pain, or tenderness where the shot is given, and fever, loss of appetite, fussiness (irritability), feeling tired, headache, and chills can happen after PCV13 vaccination. Young children may be at increased risk for seizures caused by fever after PCV13 if it is administered at the same time as inactivated influenza vaccine.Ask your health care provider for more information. People sometimes faint after medical procedures, including vaccination. Tellyour provider if you feel dizzy or have vision changes or ringing in the ears. As with any medicine, there is a very remote chance of a vaccine causing asevere allergic reaction, other serious injury, or death. 5. What if there is a serious problem? An allergic reaction could occur after the vaccinated person leaves the clinic. If you see signs of a severe allergic reaction (hives, swelling of the face and throat, difficulty breathing, a fast heartbeat, dizziness, or weakness), call 9-1-1 and get the person to the nearest hospital. For other signs that concern you, call your health care provider. Adverse reactions should be reported to the Vaccine Adverse Event Reporting System (VAERS). Your health care provider will usually file this report, or you can do it yourself. Visit the VAERS website at www.vaers.SamedayNews.es or call 438-393-2452. VAERS is only for reporting reactions, and VAERS staff members do not give medical advice. 6. The National Vaccine Injury Compensation Program The [employer] Vaccine Injury Compensation Program (VICP) is a federal program that was  created to  compensate people who may have been injured by certain vaccines. Claims regarding alleged injury or death due to vaccination have a time limit for filing, which may be as short as two years. Visit the VICP website at GoldCloset.com.ee or call 517 635 5062 to learn about the program and about filing a claim. 7. How can I learn more? Ask your health care provider. Call your local or state health department. Visit the website of the Food and Drug Administration (FDA) for vaccine package inserts and additional information at TraderRating.uy. Contact the Centers for Disease Control and Prevention (CDC): Call 412-049-1320 (1-800-CDC-INFO) or Visit CDC's website at http://hunter.com/. Vaccine Information Statement PCV13 (01/20/2020) This information is not intended to replace advice given to you by your health care provider. Make sure you discuss any questions you have with your healthcare provider. Document Revised: 03/08/2020 Document Reviewed: 03/08/2020 Elsevier Patient Education  2022 [employer].

## 2021-02-05 NOTE — Assessment & Plan Note (Addendum)
+   fam hx. We discussed further work-up today.  29 out of 30 score on the Mini-Mental status exam.  We will go ahead and get some additional labs just to rule out deficiencies etc.  We will go ahead and place referral to neuropsychiatry for further evaluation and work-up consider MRI in the future.

## 2021-02-05 NOTE — Progress Notes (Signed)
Established Patient Office Visit  Subjective:  Patient ID: Diane Chavez, female    DOB: Jul 06, 1955  Age: 65 y.o. MRN: [id number]  CC:  Chief Complaint  Patient presents with   Memory Loss    HPI Diane Chavez presents for concerns about her memory.  She says for a while now she is just noted she is struggling more with remembering more simple words and remembering people's names even though she knows them really well.  She said it would happen occasionally but now it is happening more frequently.  She is even had to tell the guys at work to make sure to email her the request instead of asking her because she has not been able to keep track she is having to write things down more often she is concerned because her mom was diagnosed with Alzheimer's in her early to mid 39s.  Sleep is fair.  She says in fact affect a little better than it was previously she had been taking an over-the-counter supplement and says she no longer is taking that.  She denies feeling down depressed or hopeless but does get nervous and anxious about things more than she used to.  She says even her husband's been concerned.  Past Medical History:  Diagnosis Date   Anxiety    Crohn disease ([place])    Dr. Laverta [place]   Depression    on prozac previously   Hyperlipidemia    Miscarriage     Past Surgical History:  Procedure Laterality Date   CARDIAC SURGERY     as a child   EYE SURGERY  10/30/2009   repair of detached retina   TONSILLECTOMY  1965    Family History  Problem Relation Age of Onset   Alzheimer's disease Mother     Social History   Socioeconomic History   Marital status: Married    Spouse name: [name]   Number of children: 3   Years of education: Not on file   Highest education level: Not on file  Occupational History   Occupation: Network engineer    Comment: [employer]  Tobacco Use   Smoking status: Former    Types: Cigarettes    Quit date: 06/16/1974    Years since quitting: 46.6    Smokeless tobacco: Never  Substance and Sexual Activity   Alcohol use: Yes    Alcohol/week: 7.0 standard drinks    Types: 7 Glasses of wine per week   Drug use: No   Sexual activity: Not on file  Other Topics Concern   Not on file  Social History Narrative   Lives with husband.Regular exercise-no.   Social Determinants of Health   Financial Resource Strain: Not on file  Food Insecurity: Not on file  Transportation Needs: Not on file  Physical Activity: Not on file  Stress: Not on file  Social Connections: Not on file  Intimate Partner Violence: Not on file    Outpatient Medications Prior to Visit  Medication Sig Dispense Refill   B Complex-Folic Acid (B COMPLEX PLUS PO) Take 1 tablet by mouth daily.       BORON PO Take by mouth.     Calcium Carbonate-Vit D-Min (CALCIUM 1200 PO) Take by mouth.       cholecalciferol (VITAMIN D) 1000 units tablet Take 1,000 Units by mouth daily.     LUTEIN PO Take 1 tablet by mouth daily.     mesalamine (LIALDA) 1.2 g EC tablet Take by mouth daily with breakfast.  Omega-3 Fatty Acids (FISH OIL PO) Take 2 capsules by mouth daily.     orphenadrine (NORFLEX) 100 MG tablet Take 1 tablet (100 mg total) by mouth 2 (two) times daily as needed for muscle spasms. 60 tablet 0   Probiotic CHEW Chew by mouth daily.     VITAMIN K PO Take by mouth.     No facility-administered medications prior to visit.    No Known Allergies  ROS Review of Systems    Objective:    Physical Exam Vitals reviewed.  Constitutional:      Appearance: She is well-developed.  HENT:     Head: Normocephalic and atraumatic.  Eyes:     Conjunctiva/sclera: Conjunctivae normal.  Cardiovascular:     Rate and Rhythm: Normal rate.  Pulmonary:     Effort: Pulmonary effort is normal.  Skin:    General: Skin is dry.     Coloration: Skin is not pale.  Neurological:     Mental Status: She is alert and oriented to person, place, and time.  Psychiatric:        Behavior:  Behavior normal.    BP (!) 106/58   Pulse 60   Temp 98.7 F (37.1 C)   Ht 5' 9"  (1.753 m)   Wt 124 lb (56.2 kg)   LMP 06/11/2000   SpO2 100%   BMI 18.31 kg/m  Wt Readings from Last 3 Encounters:  02/05/21 124 lb (56.2 kg)  04/23/20 124 lb (56.2 kg)  04/18/19 120 lb (54.4 kg)     Health Maintenance Due  Topic Date Due   MAMMOGRAM  05/24/2020    There are no preventive care reminders to display for this patient.  Lab Results  Component Value Date   TSH 0.61 02/05/2021   Lab Results  Component Value Date   WBC 4.6 02/05/2021   HGB 13.2 02/05/2021   HCT 38.0 02/05/2021   MCV 91.8 02/05/2021   PLT 223 02/05/2021   Lab Results  Component Value Date   NA 138 02/05/2021   K 4.4 02/05/2021   CO2 31 02/05/2021   GLUCOSE 85 02/05/2021   BUN 13 02/05/2021   CREATININE 0.67 02/05/2021   BILITOT 0.4 02/05/2021   ALKPHOS 59 09/26/2016   AST 19 02/05/2021   ALT 11 02/05/2021   PROT 7.4 02/05/2021   ALBUMIN 4.1 09/26/2016   CALCIUM 10.1 02/05/2021   EGFR 98 02/05/2021   Lab Results  Component Value Date   CHOL 221 (H) 04/26/2020   Lab Results  Component Value Date   HDL 78 04/26/2020   Lab Results  Component Value Date   LDLCALC 127 (H) 04/26/2020   Lab Results  Component Value Date   TRIG 67 04/26/2020   Lab Results  Component Value Date   CHOLHDL 2.8 04/26/2020   No results found for: HGBA1C    Assessment & Plan:   Problem List Items Addressed This Visit       Other   Memory change - Primary    + fam hx. We discussed further work-up today.  29 out of 30 score on the Mini-Mental status exam.  We will go ahead and get some additional labs just to rule out deficiencies etc.  We will go ahead and place referral to neuropsychiatry for further evaluation and work-up consider MRI in the future.      Relevant Orders   CBC (Completed)   COMPLETE METABOLIC PANEL WITH GFR (Completed)   TSH (Completed)   VITAMIN D  25 Hydroxy (Vit-D Deficiency,  Fractures) (Completed)   B12 (Completed)   RPR   Ambulatory referral to Neuropsychology   Other Visit Diagnoses     Screening mammogram for breast cancer       Relevant Orders   MM DIGITAL SCREENING BILATERAL   Need for pneumococcal vaccination       Relevant Orders   Pneumococcal conjugate vaccine 20-valent (Completed)      I do think she is more high risk because of her regional enteritis.  Recommend pneumonia vaccine today.  No orders of the defined types were placed in this encounter.   Follow-up: Return in about 3 months (around 05/08/2021) for Memory problems  .    Beatrice Lecher, MD

## 2021-02-06 LAB — COMPLETE METABOLIC PANEL WITH GFR
AG Ratio: 1.7 (calc) (ref 1.0–2.5)
ALT: 11 U/L (ref 6–29)
AST: 19 U/L (ref 10–35)
Albumin: 4.7 g/dL (ref 3.6–5.1)
Alkaline phosphatase (APISO): 62 U/L (ref 37–153)
BUN: 13 mg/dL (ref 7–25)
CO2: 31 mmol/L (ref 20–32)
Calcium: 10.1 mg/dL (ref 8.6–10.4)
Chloride: 100 mmol/L (ref 98–110)
Creat: 0.67 mg/dL (ref 0.50–1.05)
Globulin: 2.7 g/dL (calc) (ref 1.9–3.7)
Glucose, Bld: 85 mg/dL (ref 65–99)
Potassium: 4.4 mmol/L (ref 3.5–5.3)
Sodium: 138 mmol/L (ref 135–146)
Total Bilirubin: 0.4 mg/dL (ref 0.2–1.2)
Total Protein: 7.4 g/dL (ref 6.1–8.1)
eGFR: 98 mL/min/{1.73_m2} (ref >=60)

## 2021-02-06 LAB — CBC
HCT: 38 % (ref 35.0–45.0)
Hemoglobin: 13.2 g/dL (ref 11.7–15.5)
MCH: 31.9 pg (ref 27.0–33.0)
MCHC: 34.7 g/dL (ref 32.0–36.0)
MCV: 91.8 fL (ref 80.0–100.0)
MPV: 9.6 fL (ref 7.5–12.5)
Platelets: 223 10*3/uL (ref 140–400)
RBC: 4.14 10*6/uL (ref 3.80–5.10)
RDW: 12.2 % (ref 11.0–15.0)
WBC: 4.6 10*3/uL (ref 3.8–10.8)

## 2021-02-06 LAB — RPR: RPR Ser Ql: NONREACTIVE

## 2021-02-06 LAB — VITAMIN D 25 HYDROXY (VIT D DEFICIENCY, FRACTURES): Vit D, 25-Hydroxy: 92 ng/mL (ref 30–100)

## 2021-02-06 LAB — VITAMIN B12: Vitamin B-12: 459 pg/mL (ref 200–1100)

## 2021-02-06 LAB — TSH: TSH: 0.61 mIU/L (ref 0.40–4.50)

## 2021-02-06 NOTE — Progress Notes (Signed)
Your lab work is within acceptable range and there are no concerning findings.   ?

## 2021-02-27 ENCOUNTER — Other Ambulatory Visit: Payer: Self-pay

## 2021-02-27 ENCOUNTER — Ambulatory Visit (INDEPENDENT_AMBULATORY_CARE_PROVIDER_SITE_OTHER): Payer: BLUE CROSS/BLUE SHIELD

## 2021-02-27 DIAGNOSIS — Z1231 Encounter for screening mammogram for malignant neoplasm of breast: Secondary | ICD-10-CM

## 2021-03-05 NOTE — Progress Notes (Signed)
Please call patient. Normal mammogram.  Repeat in 1 year.  

## 2021-04-16 DIAGNOSIS — M5451 Vertebrogenic low back pain: Secondary | ICD-10-CM | POA: Diagnosis not present

## 2021-04-16 DIAGNOSIS — M25551 Pain in right hip: Secondary | ICD-10-CM | POA: Diagnosis not present

## 2021-04-16 DIAGNOSIS — M9905 Segmental and somatic dysfunction of pelvic region: Secondary | ICD-10-CM | POA: Diagnosis not present

## 2021-04-16 DIAGNOSIS — M9903 Segmental and somatic dysfunction of lumbar region: Secondary | ICD-10-CM | POA: Diagnosis not present

## 2021-04-22 DIAGNOSIS — M9903 Segmental and somatic dysfunction of lumbar region: Secondary | ICD-10-CM | POA: Diagnosis not present

## 2021-04-22 DIAGNOSIS — M25551 Pain in right hip: Secondary | ICD-10-CM | POA: Diagnosis not present

## 2021-04-22 DIAGNOSIS — M5451 Vertebrogenic low back pain: Secondary | ICD-10-CM | POA: Diagnosis not present

## 2021-04-22 DIAGNOSIS — M9905 Segmental and somatic dysfunction of pelvic region: Secondary | ICD-10-CM | POA: Diagnosis not present

## 2021-04-24 DIAGNOSIS — M25551 Pain in right hip: Secondary | ICD-10-CM | POA: Diagnosis not present

## 2021-04-24 DIAGNOSIS — M9903 Segmental and somatic dysfunction of lumbar region: Secondary | ICD-10-CM | POA: Diagnosis not present

## 2021-04-24 DIAGNOSIS — M5451 Vertebrogenic low back pain: Secondary | ICD-10-CM | POA: Diagnosis not present

## 2021-04-24 DIAGNOSIS — M9905 Segmental and somatic dysfunction of pelvic region: Secondary | ICD-10-CM | POA: Diagnosis not present

## 2021-05-13 ENCOUNTER — Ambulatory Visit: Payer: BLUE CROSS/BLUE SHIELD | Admitting: Family Medicine

## 2021-05-13 ENCOUNTER — Other Ambulatory Visit: Payer: Self-pay

## 2021-05-13 ENCOUNTER — Encounter: Payer: Self-pay | Admitting: Family Medicine

## 2021-05-13 VITALS — BP 118/60 | HR 65 | Wt 126.0 lb

## 2021-05-13 DIAGNOSIS — R413 Other amnesia: Secondary | ICD-10-CM | POA: Diagnosis not present

## 2021-05-13 NOTE — Assessment & Plan Note (Signed)
We will check on the neuropsychology referral.  She never heard back to me from them.  Referral was placed 3 months ago.  We will go ahead and move forward with MRIs we can get this approved with the insurance and hopefully get it done in the next couple of weeks.  Did not repeat a Mini-Mental [place] today but plan to check again in 6 months.

## 2021-05-13 NOTE — Progress Notes (Addendum)
Established Patient Office Visit  Subjective:  Patient ID: Diane Chavez, female    DOB: [birth year]  Age: 65 y.o. MRN: [id number]  CC:  Chief Complaint  Patient presents with   Memory Loss    HPI Diane Chavez presents for 81-monthfollow-up for memory concerns.  Mini-Mental status exam score of 29 out of 33 months ago.  We did some labs and [facility]  We discussed the possibility of an MRI.  She does not feel it is made any major changes she still having difficulty with word finding mostly.  And just feeling more anxious about going out in public by herself feeling like she might get more confused.  [facility]  They did join a new church and so they have become much more socially interactive she does try to do some mind games on her smart phone.  She is not currently exercising but says she does have a treadmill at home.  She denies any frequent headaches or other neurologic symptoms.  Though she has noted that sometimes when she closes her eyes she will see like a little white streak in her vision.  [place]lietz at the bottom of her visual field in both eyes.  She does have chronic neck pain and wonders if it could be contributing to some of her symptoms.  Husband was here for the visit.    Past Medical History:  Diagnosis Date   Anxiety    [place]    Dr. [place]ta [place]  Depression    on prozac previously   Hyperlipidemia    Miscarriage     Past Surgical History:  Procedure Laterality Date   CARDIAC SURGERY     as a child   EYE SURGERY  10/30/2009   repair of detached retina   TONSILLECTOMY  1965    Family History  Problem Relation Age of Onset   Alzheimer's disease Mother     Social History   Socioeconomic History   Marital status: Married    Spouse name: GDominica Severin  Number of children: 3   Years of education: Not on file   Highest education level: Not on file  Occupational  History   Occupation: SNetwork engineer   Comment: [employer] Tobacco Use   Smoking status: Former    Types: Cigarettes    Quit date: 06/16/1974    Years since quitting: 46.9   Smokeless tobacco: Never  Substance and Sexual Activity   Alcohol use: Yes    Alcohol/week: 7.0 standard drinks    Types: 7 Glasses of wine per week   Drug use: No   Sexual activity: Not on file  Other Topics Concern   Not on file  Social History Narrative   Lives with husband.Regular exercise-no.   Social Determinants of Health   Financial Resource Strain: Not on file  Food Insecurity: Not on file  Transportation Needs: Not on file  Physical Activity: Not on file  Stress: Not on file  Social Connections: Not on file  Intimate Partner Violence: Not on file    Outpatient Medications Prior to Visit  Medication Sig Dispense Refill   B Complex-Folic Acid (B COMPLEX PLUS PO) Take 1 tablet by mouth daily.       BORON PO Take by mouth.     Calcium Carbonate-Vit D-Min (CALCIUM 1200 PO) Take by mouth.       cholecalciferol (VITAMIN  D) 1000 units tablet Take 1,000 Units by mouth daily.     LUTEIN PO Take 1 tablet by mouth daily.     mesalamine (LIALDA) 1.2 g EC tablet Take by mouth daily with breakfast.     Omega-3 Fatty Acids (FISH OIL PO) Take 2 capsules by mouth daily.     orphenadrine (NORFLEX) 100 MG tablet Take 1 tablet (100 mg total) by mouth 2 (two) times daily as needed for muscle spasms. 60 tablet 0   VITAMIN K PO Take by mouth.     Probiotic CHEW Chew by mouth daily.     No facility-administered medications prior to visit.    No Known Allergies  ROS Review of Systems    Objective:    Physical Exam  BP 118/60   Pulse 65   Wt 126 lb (57.2 kg)   LMP 06/11/2000   SpO2 100%   BMI 18.61 kg/m  Wt Readings from Last 3 Encounters:  05/13/21 126 lb (57.2 kg)  02/05/21 124 lb (56.2 kg)  04/23/20 124 lb (56.2 kg)     Health Maintenance Due  Topic Date Due   COVID-19 Vaccine (4 -  Booster for Pfizer series) 03/29/2021    There are no preventive care reminders to display for this patient.  Lab Results  Component Value Date   TSH 0.61 02/05/2021   Lab Results  Component Value Date   WBC 4.6 02/05/2021   HGB 13.2 02/05/2021   HCT 38.0 02/05/2021   MCV 91.8 02/05/2021   PLT 223 02/05/2021   Lab Results  Component Value Date   NA 138 02/05/2021   K 4.4 02/05/2021   CO2 31 02/05/2021   GLUCOSE 85 02/05/2021   BUN 13 02/05/2021   CREATININE 0.67 02/05/2021   BILITOT 0.4 02/05/2021   ALKPHOS 59 09/26/2016   AST 19 02/05/2021   ALT 11 02/05/2021   PROT 7.4 02/05/2021   ALBUMIN 4.1 09/26/2016   CALCIUM 10.1 02/05/2021   EGFR 98 02/05/2021   Lab Results  Component Value Date   CHOL 221 (H) 04/26/2020   Lab Results  Component Value Date   HDL 78 04/26/2020   Lab Results  Component Value Date   LDLCALC 127 (H) 04/26/2020   Lab Results  Component Value Date   TRIG 67 04/26/2020   Lab Results  Component Value Date   CHOLHDL 2.8 04/26/2020   No results found for: HGBA1C    Assessment & Plan:   Problem List Items Addressed This Visit       Other   Memory change - Primary    We will check on the neuropsychology referral.  She never heard back to me from them.  Referral was placed 3 months ago.  We will go ahead and move forward with MRIs we can get this approved with the insurance and hopefully get it done in the next couple of weeks.  Did not repeat a Mini-Mental [place] today but plan to check again in 6 months.      Relevant Orders   MR Brain W Wo Contrast    No orders of the defined types were placed in this encounter.   Follow-up: No follow-ups on file.    Beatrice Lecher, MD

## 2021-05-13 NOTE — Addendum Note (Signed)
Addended by: Beatrice Lecher D on: 05/13/2021 04:12 PM   Modules accepted: Orders

## 2021-05-21 ENCOUNTER — Encounter: Payer: Self-pay | Admitting: Family Medicine

## 2021-05-27 ENCOUNTER — Other Ambulatory Visit: Payer: BC Managed Care – PPO

## 2021-07-24 ENCOUNTER — Encounter: Payer: BC Managed Care – PPO | Admitting: Sports Medicine

## 2021-07-26 ENCOUNTER — Ambulatory Visit (INDEPENDENT_AMBULATORY_CARE_PROVIDER_SITE_OTHER): Payer: BC Managed Care – PPO

## 2021-07-26 ENCOUNTER — Ambulatory Visit: Payer: BC Managed Care – PPO | Admitting: Sports Medicine

## 2021-07-26 ENCOUNTER — Other Ambulatory Visit: Payer: Self-pay

## 2021-07-26 DIAGNOSIS — M19011 Primary osteoarthritis, right shoulder: Secondary | ICD-10-CM | POA: Diagnosis not present

## 2021-07-26 DIAGNOSIS — M25511 Pain in right shoulder: Secondary | ICD-10-CM | POA: Diagnosis not present

## 2021-07-26 MED ORDER — MELOXICAM 15 MG PO TABS: ORAL_TABLET | ORAL | 3 refills | Status: DC

## 2021-07-26 NOTE — Progress Notes (Signed)
° ° °  Procedures performed today:    None.  Independent interpretation of notes and tests performed by another provider:   None.  Brief History, Exam, Impression, and Recommendations:    Osteoarthritis of right acromioclavicular joint Several weeks of pain right shoulder localized over the acromioclavicular joint. Positive crossarm sign, other shoulder exam maneuvers unremarkable. Adding x-rays, meloxicam, home conditioning, return to see me in 6 weeks, acromioclavicular injection if no better.  Chronic process with exacerbation and pharmacologic intervention  ___________________________________________ Gwen Her. [name], M.D., ABFM., CAQSM. Primary Care and [place] Instructor of [employer] [facility]

## 2021-07-26 NOTE — Assessment & Plan Note (Signed)
Several weeks of pain right shoulder localized over the acromioclavicular joint. Positive crossarm sign, other shoulder exam maneuvers unremarkable. Adding x-rays, meloxicam, home conditioning, return to see me in 6 weeks, acromioclavicular injection if no better.

## 2021-08-02 ENCOUNTER — Ambulatory Visit: Payer: BC Managed Care – PPO | Admitting: Sports Medicine

## 2021-08-19 ENCOUNTER — Other Ambulatory Visit: Payer: Self-pay

## 2021-08-19 ENCOUNTER — Other Ambulatory Visit: Payer: Self-pay | Admitting: Family Medicine

## 2021-08-19 ENCOUNTER — Encounter: Payer: Self-pay | Admitting: Family Medicine

## 2021-08-19 ENCOUNTER — Ambulatory Visit (INDEPENDENT_AMBULATORY_CARE_PROVIDER_SITE_OTHER): Payer: Medicare HMO | Admitting: Family Medicine

## 2021-08-19 VITALS — BP 124/62 | HR 70 | Resp 16 | Ht 69.0 in | Wt 129.0 lb

## 2021-08-19 DIAGNOSIS — M542 Cervicalgia: Secondary | ICD-10-CM | POA: Diagnosis not present

## 2021-08-19 DIAGNOSIS — R413 Other amnesia: Secondary | ICD-10-CM

## 2021-08-19 DIAGNOSIS — M19011 Primary osteoarthritis, right shoulder: Secondary | ICD-10-CM

## 2021-08-19 DIAGNOSIS — M25562 Pain in left knee: Secondary | ICD-10-CM

## 2021-08-19 MED ORDER — ORPHENADRINE CITRATE ER 100 MG PO TB12: 100.0000 mg | ORAL_TABLET | Freq: Two times a day (BID) | ORAL | 3 refills | Status: DC | PRN

## 2021-08-19 NOTE — Patient Instructions (Signed)
Followup after scan  ?

## 2021-08-19 NOTE — Progress Notes (Signed)
Acute Office Visit  Subjective:    Patient ID: Diane Chavez, female    DOB: [birth year], 66 y.o.   MRN: [id number]  Chief Complaint  Patient presents with   Neck Pain    Pain radiates across shoulders, 2 months.    Cognitive Concern    Discuss scan. Patient states she is having trouble remembering names and is getting more confused lately.     Knee Pain    Off and on for several months, tightness. Knee gave out once.     HPI Patient is in today for   A couple of different concerns.  She says that several weeks ago she actually woke up with a burning sensation going down her right arm starting at the shoulder and going all the way down she put ice on it and then took some ibuprofen and got some relief she has not had that happen since then but she did follow-up with Dr. Darene Lamer in mid February for that right shoulder.  That she has had chronic pain across her upper shoulders over both trapezius muscles and going up into her cervical spine she says that is been there for years and often takes meloxicam but not daily.    She wanted to let me know that she actually had 2 episodes of suddenly feeling hot and feeling like she was going to pass out.  Both times she was able to sit down and eat something to have some cool ice water and then felt better.  The episode just lasted for maybe 5 minutes total.  She never actually passed out.  This was not associated with an increase in her pain levels.  She denies any chest pain or palpitations around that time though she does have a history of recurrent palpitations.  She also wanted me to take a look at her left knee today.  She says its been a little tender medially and its been kind of a little achy and sore but last week she says it actually gave out while she was walking down the hall at work and then felt a sudden sharp pain from the outer left knee up to her left hip.  Past Medical History:  Diagnosis Date   Anxiety    Crohn disease ([place])     Dr. Laverta [place]   Depression    on prozac previously   Hyperlipidemia    Miscarriage     Past Surgical History:  Procedure Laterality Date   CARDIAC SURGERY     as a child   EYE SURGERY  10/30/2009   repair of detached retina   TONSILLECTOMY  1965    Family History  Problem Relation Age of Onset   Alzheimer's disease Mother     Social History   Socioeconomic History   Marital status: Married    Spouse name: [name]   Number of children: 3   Years of education: Not on file   Highest education level: Not on file  Occupational History   Occupation: Network engineer    Comment: [employer]  Tobacco Use   Smoking status: Former    Types: Cigarettes    Quit date: 06/16/1974    Years since quitting: 47.2   Smokeless tobacco: Never  Substance and Sexual Activity   Alcohol use: Yes    Alcohol/week: 7.0 standard drinks    Types: 7 Glasses of wine per week   Drug use: No   Sexual activity: Not on file  Other Topics Concern  Not on file  Social History Narrative   Lives with husband.Regular exercise-no.   Social Determinants of Health   Financial Resource Strain: Not on file  Food Insecurity: Not on file  Transportation Needs: Not on file  Physical Activity: Not on file  Stress: Not on file  Social Connections: Not on file  Intimate Partner Violence: Not on file    Outpatient Medications Prior to Visit  Medication Sig Dispense Refill   B Complex-Folic Acid (B COMPLEX PLUS PO) Take 1 tablet by mouth daily.       BORON PO Take by mouth.     Calcium Carbonate-Vit D-Min (CALCIUM 1200 PO) Take by mouth.       cholecalciferol (VITAMIN D) 1000 units tablet Take 1,000 Units by mouth daily.     LUTEIN PO Take 1 tablet by mouth daily.     meloxicam (MOBIC) 15 MG tablet One tab PO qAM with a meal for 2 weeks, then daily prn pain. 30 tablet 3   mesalamine (LIALDA) 1.2 g EC tablet Take by mouth daily with breakfast.     Omega-3 Fatty Acids (FISH OIL PO) Take 2 capsules by mouth daily.      VITAMIN K PO Take by mouth.     orphenadrine (NORFLEX) 100 MG tablet Take 1 tablet (100 mg total) by mouth 2 (two) times daily as needed for muscle spasms. 60 tablet 0   No facility-administered medications prior to visit.    No Known Allergies  Review of Systems     Objective:    Physical Exam Vitals and nursing note reviewed.  Constitutional:      Appearance: She is well-developed.  HENT:     Head: Normocephalic and atraumatic.  Eyes:     General:        Left eye: Discharge present. Cardiovascular:     Rate and Rhythm: Normal rate and regular rhythm.     Heart sounds: Normal heart sounds.  Pulmonary:     Effort: Pulmonary effort is normal.     Breath sounds: Normal breath sounds.  Musculoskeletal:     Comments: Tender over the lower cervical spine.  She has a very tight trapezius muscles bilaterally.  Left knee is tender medially and swollen medially.  Crepitus with flexion and extension.  No increased laxity with anterior drawer.  Skin:    General: Skin is warm and dry.  Neurological:     Mental Status: She is alert and oriented to person, place, and time.  Psychiatric:        Behavior: Behavior normal.    BP 124/62    Pulse 70    Resp 16    Ht 5' 9"  (1.753 m)    Wt 129 lb (58.5 kg)    LMP 06/11/2000    SpO2 100%    BMI 19.05 kg/m  Wt Readings from Last 3 Encounters:  08/19/21 129 lb (58.5 kg)  05/13/21 126 lb (57.2 kg)  02/05/21 124 lb (56.2 kg)    There are no preventive care reminders to display for this patient.  There are no preventive care reminders to display for this patient.   Lab Results  Component Value Date   TSH 0.61 02/05/2021   Lab Results  Component Value Date   WBC 4.6 02/05/2021   HGB 13.2 02/05/2021   HCT 38.0 02/05/2021   MCV 91.8 02/05/2021   PLT 223 02/05/2021   Lab Results  Component Value Date   NA 138 02/05/2021   K  4.4 02/05/2021   CO2 31 02/05/2021   GLUCOSE 85 02/05/2021   BUN 13 02/05/2021   CREATININE 0.67  02/05/2021   BILITOT 0.4 02/05/2021   ALKPHOS 59 09/26/2016   AST 19 02/05/2021   ALT 11 02/05/2021   PROT 7.4 02/05/2021   ALBUMIN 4.1 09/26/2016   CALCIUM 10.1 02/05/2021   EGFR 98 02/05/2021   Lab Results  Component Value Date   CHOL 221 (H) 04/26/2020   Lab Results  Component Value Date   HDL 78 04/26/2020   Lab Results  Component Value Date   LDLCALC 127 (H) 04/26/2020   Lab Results  Component Value Date   TRIG 67 04/26/2020   Lab Results  Component Value Date   CHOLHDL 2.8 04/26/2020   No results found for: HGBA1C     Assessment & Plan:   Problem List Items Addressed This Visit       Musculoskeletal and Integument   Osteoarthritis of right acromioclavicular joint - Primary   Relevant Medications   orphenadrine (NORFLEX) 100 MG tablet     Other   Memory change    Still really struggling with word finding and remembering small things that people tell her even at work she will have to tell her coworkers to send her an email otherwise she will forget what they have asked her to do.  We had discussed this previously back in the fall but she wanted to wait until she got on Medicare.  We will go ahead and place a new order for MRI.  And she wants to wait until we get those results before referring to neuropsych for further work-up.      Relevant Orders   MR Brain W Wo Contrast   COMPLETE METABOLIC PANEL WITH GFR   CBC   TSH   Other Visit Diagnoses     Acute pain of left knee       Relevant Orders   DG Knee Complete 4 Views Left   Neck pain       Relevant Orders   COMPLETE METABOLIC PANEL WITH GFR   CBC   TSH   DG Cervical Spine Complete      Pain-she is a little swollen medially and tender over the joint line I suspect it is consistent with arthritis but it did give out so there may be something more internal going on as well though I did not note any significant laxity in that knee.  Neck pain-we will get a plain film x-ray it looks like the last  time she had films that I could find was probably back in 2006.  She also has a lot of tightness in her upper trapezius muscles but has tried things like massage and PT without significant relief.    Meds ordered this encounter  Medications   orphenadrine (NORFLEX) 100 MG tablet    Sig: Take 1 tablet (100 mg total) by mouth 2 (two) times daily as needed for muscle spasms.    Dispense:  60 tablet    Refill:  3     Beatrice Lecher, MD

## 2021-08-19 NOTE — Assessment & Plan Note (Signed)
Still really struggling with word finding and remembering small things that people tell her even at work she will have to tell her coworkers to send her an email otherwise she will forget what they have asked her to do.  We had discussed this previously back in the fall but she wanted to wait until she got on Medicare.  We will go ahead and place a new order for MRI.  And she wants to wait until we get those results before referring to neuropsych for further work-up. ?

## 2021-08-20 ENCOUNTER — Ambulatory Visit (INDEPENDENT_AMBULATORY_CARE_PROVIDER_SITE_OTHER): Payer: Medicare HMO

## 2021-08-20 DIAGNOSIS — M5414 Radiculopathy, thoracic region: Secondary | ICD-10-CM | POA: Diagnosis not present

## 2021-08-20 DIAGNOSIS — M25562 Pain in left knee: Secondary | ICD-10-CM

## 2021-08-20 DIAGNOSIS — M542 Cervicalgia: Secondary | ICD-10-CM | POA: Diagnosis not present

## 2021-08-20 DIAGNOSIS — G8929 Other chronic pain: Secondary | ICD-10-CM

## 2021-08-20 DIAGNOSIS — M47812 Spondylosis without myelopathy or radiculopathy, cervical region: Secondary | ICD-10-CM | POA: Diagnosis not present

## 2021-08-20 LAB — COMPLETE METABOLIC PANEL WITH GFR
AG Ratio: 1.7 (calc) (ref 1.0–2.5)
ALT: 11 U/L (ref 6–29)
AST: 19 U/L (ref 10–35)
Albumin: 4.7 g/dL (ref 3.6–5.1)
Alkaline phosphatase (APISO): 71 U/L (ref 37–153)
BUN: 19 mg/dL (ref 7–25)
CO2: 30 mmol/L (ref 20–32)
Calcium: 10.8 mg/dL — ABNORMAL HIGH (ref 8.6–10.4)
Chloride: 97 mmol/L — ABNORMAL LOW (ref 98–110)
Creat: 0.77 mg/dL (ref 0.50–1.05)
Globulin: 2.7 g/dL (calc) (ref 1.9–3.7)
Glucose, Bld: 82 mg/dL (ref 65–99)
Potassium: 4.3 mmol/L (ref 3.5–5.3)
Sodium: 136 mmol/L (ref 135–146)
Total Bilirubin: 0.5 mg/dL (ref 0.2–1.2)
Total Protein: 7.4 g/dL (ref 6.1–8.1)
eGFR: 86 mL/min/{1.73_m2} (ref >=60)

## 2021-08-20 LAB — CBC
HCT: 40.5 % (ref 35.0–45.0)
Hemoglobin: 13.7 g/dL (ref 11.7–15.5)
MCH: 31.2 pg (ref 27.0–33.0)
MCHC: 33.8 g/dL (ref 32.0–36.0)
MCV: 92.3 fL (ref 80.0–100.0)
MPV: 9.7 fL (ref 7.5–12.5)
Platelets: 219 10*3/uL (ref 140–400)
RBC: 4.39 10*6/uL (ref 3.80–5.10)
RDW: 12.2 % (ref 11.0–15.0)
WBC: 5.6 10*3/uL (ref 3.8–10.8)

## 2021-08-20 LAB — TSH: TSH: 0.71 mIU/L (ref 0.40–4.50)

## 2021-08-20 NOTE — Progress Notes (Signed)
Hi Diane Chavez, no sign of fracture or dislocation of the knee.  There is a little bit of calcification of the medial meniscus cartilage likely due to arthritis.  No large bone spurs which is good.  We could consider formal physical therapy and I do think that could be helpful.  Or if you prefer we can always get you back in again with Dr. Darene Lamer to take a little further look at that knee.  Just let me know what you think

## 2021-08-20 NOTE — Progress Notes (Signed)
Hi Malina, the x-ray of your neck compared to old films from 2006 over a decade ago does show some arthritis that is coming up really close to the spaces where the nerve roots come out of the spine at C4-5, C5-6 levels.  And it looks a little bit worse on the left side at C5-6 level.  I do think referring you to Ortho or sports med could be helpful for your neck.  I cannot remember if you said that you have ever had injections for your neck but it could be helpful if there really is some pinching or narrowing where that nerve root exits that could be triggering some of your pain and discomfort.  We could always have Dr. Darene Lamer address your neck at the next visit as well.

## 2021-08-20 NOTE — Progress Notes (Signed)
Hi [place], overall your labs look good.  Your calcium level was just slightly elevated but if you are taking a calcium supplement that would not be unusual.  Your thyroid and blood count are normal.

## 2021-08-21 ENCOUNTER — Encounter: Payer: Self-pay | Admitting: Family Medicine

## 2021-08-22 ENCOUNTER — Other Ambulatory Visit: Payer: Self-pay

## 2021-08-22 ENCOUNTER — Ambulatory Visit (INDEPENDENT_AMBULATORY_CARE_PROVIDER_SITE_OTHER): Payer: Medicare HMO | Admitting: Sports Medicine

## 2021-08-22 DIAGNOSIS — M5030 Other cervical disc degeneration, unspecified cervical region: Secondary | ICD-10-CM

## 2021-08-22 MED ORDER — PREDNISONE 50 MG PO TABS: ORAL_TABLET | ORAL | 0 refills | Status: DC

## 2021-08-22 NOTE — Progress Notes (Signed)
? ? ?  Procedures performed today:   ? ?None. ? ?Independent interpretation of notes and tests performed by another provider:  ? ?Cervical spine x-rays personally reviewed, she has moderate to severe DDD at multiple levels worst at C5-C6. ? ?Brief History, Exam, Impression, and Recommendations:   ? ?DDD (degenerative disc disease), cervical ?This is a very pleasant 66 year old female, she has started having increasing axial pain in her neck, mid cervical spine with radiation into both trapezii, up into the occiput. ?She did get some x-rays with her PCP that showed moderate cervical DDD worst at the C5-C6 level, nothing overtly radicular. ?On exam she has normal reflexes, normal strength, we will do 5 days of prednisone, she can restart her meloxicam afterwards, home physical therapy, I did show her how to do traction, return to see me in 4 to 6 weeks, MRI for interventional planning if not better. ? ?Chronic process with exacerbation and pharmacologic intervention ? ?___________________________________________ ?Diane Her. [name], M.D., ABFM., CAQSM. ?Primary Care and Sports Medicine ?[place] ? ?Adjunct Instructor of Family Medicine  ?[employer] ?

## 2021-08-22 NOTE — Assessment & Plan Note (Addendum)
This is a very pleasant 66 year old female, she has started having increasing axial pain in her neck, mid cervical spine with radiation into both trapezii, up into the occiput. ?She did get some x-rays with her PCP that showed moderate cervical DDD worst at the C5-C6 level, nothing overtly radicular. ?On exam she has normal reflexes, normal strength, we will do 5 days of prednisone, she can restart her meloxicam afterwards, home physical therapy, I did show her how to do traction, return to see me in 4 to 6 weeks, MRI for interventional planning if not better. ?

## 2021-08-26 ENCOUNTER — Other Ambulatory Visit: Payer: Self-pay

## 2021-08-26 ENCOUNTER — Ambulatory Visit (INDEPENDENT_AMBULATORY_CARE_PROVIDER_SITE_OTHER): Payer: Medicare HMO

## 2021-08-26 DIAGNOSIS — R4182 Altered mental status, unspecified: Secondary | ICD-10-CM | POA: Diagnosis not present

## 2021-08-26 DIAGNOSIS — R413 Other amnesia: Secondary | ICD-10-CM | POA: Diagnosis not present

## 2021-08-26 DIAGNOSIS — R412 Retrograde amnesia: Secondary | ICD-10-CM

## 2021-08-26 MED ORDER — GADOBUTROL 1 MMOL/ML IV SOLN
7.0000 mL | Freq: Once | INTRAVENOUS | Status: AC | PRN
  Administered 2021-08-26: 7 mL via INTRAVENOUS

## 2021-08-27 ENCOUNTER — Encounter: Payer: Self-pay | Admitting: Family Medicine

## 2021-08-27 NOTE — Progress Notes (Signed)
Hi Zykiria, brain MRI overall looks good.  No worrisome findings such as mass, sign of stroke or fluid on the brain.  No abnormal characteristics.  Do you feel comfortable moving forward with the neuropsychiatric evaluation?  Just let me know.

## 2021-10-03 ENCOUNTER — Ambulatory Visit: Payer: Medicare HMO | Admitting: Sports Medicine

## 2021-11-13 DIAGNOSIS — R450 Nervousness: Secondary | ICD-10-CM | POA: Diagnosis not present

## 2021-11-13 DIAGNOSIS — R079 Chest pain, unspecified: Secondary | ICD-10-CM | POA: Diagnosis not present

## 2021-11-13 DIAGNOSIS — F419 Anxiety disorder, unspecified: Secondary | ICD-10-CM | POA: Diagnosis not present

## 2021-11-13 DIAGNOSIS — K5900 Constipation, unspecified: Secondary | ICD-10-CM | POA: Diagnosis not present

## 2021-11-13 DIAGNOSIS — R1013 Epigastric pain: Secondary | ICD-10-CM | POA: Diagnosis not present

## 2021-11-13 DIAGNOSIS — Z79899 Other long term (current) drug therapy: Secondary | ICD-10-CM | POA: Diagnosis not present

## 2021-11-18 ENCOUNTER — Encounter: Payer: Self-pay | Admitting: Family Medicine

## 2021-11-21 ENCOUNTER — Ambulatory Visit (INDEPENDENT_AMBULATORY_CARE_PROVIDER_SITE_OTHER): Payer: Medicare HMO | Admitting: Family Medicine

## 2021-11-21 ENCOUNTER — Encounter: Payer: Self-pay | Admitting: Family Medicine

## 2021-11-21 VITALS — BP 109/62 | HR 63 | Resp 16 | Ht 69.0 in | Wt 129.0 lb

## 2021-11-21 DIAGNOSIS — K50014 Crohn's disease of small intestine with abscess: Secondary | ICD-10-CM | POA: Diagnosis not present

## 2021-11-21 DIAGNOSIS — R9431 Abnormal electrocardiogram [ECG] [EKG]: Secondary | ICD-10-CM

## 2021-11-21 DIAGNOSIS — Z79899 Other long term (current) drug therapy: Secondary | ICD-10-CM | POA: Diagnosis not present

## 2021-11-21 DIAGNOSIS — E871 Hypo-osmolality and hyponatremia: Secondary | ICD-10-CM | POA: Diagnosis not present

## 2021-11-21 DIAGNOSIS — R1013 Epigastric pain: Secondary | ICD-10-CM | POA: Diagnosis not present

## 2021-11-21 DIAGNOSIS — I440 Atrioventricular block, first degree: Secondary | ICD-10-CM | POA: Diagnosis not present

## 2021-11-21 DIAGNOSIS — K50012 Crohn's disease of small intestine with intestinal obstruction: Secondary | ICD-10-CM | POA: Diagnosis not present

## 2021-11-21 NOTE — Telephone Encounter (Signed)
Addressed during office visit today.

## 2021-11-21 NOTE — Progress Notes (Signed)
Established Patient Office Visit  Subjective   Patient ID: Diane Chavez, female    DOB: Jan 06, 1956  Age: 66 y.o. MRN: [id number]  Chief Complaint  Patient presents with   ER Follow up     HPI  ER follow-up for abnormal heart rhythm.  She went to the emergency department at [facility] on May 31.  She had been having some intermittent palpitations for about 4 weeks.  She had described them as a "pounding" feeling in the epigastric area.  She had recently had a change in medications from mesalamine to sulfasalazine.  She had been also having some GI symptoms including increasing constipation and does have a history of ulcerative colitis.    CBC was normal at that time sodium was little borderline low at 131.  Kidney and liver function were normal.  Magnesium lipase and thyroid and troponin were normal.  Chest x-ray was negative.  EKG just showed a accelerated junctional rhythm with a rate of 74 and a normal QT interval.  She did follow-up with her GI Dr. Glennon Hamilton today for her Crohn's disease.  Hopefully be able to switch her back to the mesalamine which she has done better with.  The sulfasalazine has not really controlled her symptoms.  There are also scheduling her for an upper GI.  Since the episode that led up to the emergency department she is still having some intermittent palpitations.  She just feels like they are in the epigastric area so a little bit lower.    ROS    Objective:     BP 109/62   Pulse 63   Resp 16   Ht 5' 9"  (1.753 m)   Wt 129 lb (58.5 kg)   LMP 06/11/2000   SpO2 100%   BMI 19.05 kg/m    Physical Exam Vitals and nursing note reviewed.  Constitutional:      Appearance: She is well-developed.  HENT:     Head: Normocephalic and atraumatic.  Cardiovascular:     Rate and Rhythm: Normal rate and regular rhythm.     Heart sounds: Normal heart sounds.  Pulmonary:     Effort: Pulmonary effort is normal.     Breath sounds: Normal breath sounds.   Skin:    General: Skin is warm and dry.  Neurological:     Mental Status: She is alert and oriented to person, place, and time.  Psychiatric:        Behavior: Behavior normal.      No results found for any visits on 11/21/21.    The 10-year ASCVD risk score (Arnett DK, et al., 2019) is: 3.7%    Assessment & Plan:   Problem List Items Addressed This Visit   None Visit Diagnoses     Abnormal EKG    -  Primary   Relevant Orders   Ambulatory referral to Cardiology   Hyponatremia       Relevant Orders   BASIC METABOLIC PANEL WITH GFR   Ambulatory referral to Cardiology   First degree AV block       Relevant Orders   Ambulatory referral to Cardiology      Abnormal EKG - I am unable to see the actual tracing so just going by the interpretation in the chart.  They reported that she had a junctional rhythm.  We discussed doing an updated EKG today there are lots of different causes for junctional rhythm or junctional escape rhythm.  She did have a recent medication  change but I do not see that listed on the side effect profile.  She did have some electrolyte abnormalities at that time which certainly could have contributed.  She did not have any evidence of heart damage or heart attack that may have caused damage to the SA node.  We will go ahead and repeat EKG today.  EKG today shows rate of 57 bpm bradycardia with first-degree AV block.  PR interval is genera 24 ms.  This is a change from her prior EKG on file with [name] from March 2020 which did show some sinus bradycardia but PR interval was 162 ms.  Discussed referral to cardiology for further evaluation.  Hyponatremia-recheck sodium level today.    Return if symptoms worsen or fail to improve.    Beatrice Lecher, MD

## 2021-11-22 DIAGNOSIS — H52209 Unspecified astigmatism, unspecified eye: Secondary | ICD-10-CM | POA: Diagnosis not present

## 2021-11-22 DIAGNOSIS — H2513 Age-related nuclear cataract, bilateral: Secondary | ICD-10-CM | POA: Diagnosis not present

## 2021-11-22 DIAGNOSIS — H5213 Myopia, bilateral: Secondary | ICD-10-CM | POA: Diagnosis not present

## 2021-11-22 LAB — BASIC METABOLIC PANEL WITH GFR
BUN: 12 mg/dL (ref 7–25)
CO2: 29 mmol/L (ref 20–32)
Calcium: 10 mg/dL (ref 8.6–10.4)
Chloride: 98 mmol/L (ref 98–110)
Creat: 0.65 mg/dL (ref 0.50–1.05)
Glucose, Bld: 95 mg/dL (ref 65–99)
Potassium: 4.2 mmol/L (ref 3.5–5.3)
Sodium: 135 mmol/L (ref 135–146)
eGFR: 98 mL/min/{1.73_m2} (ref >=60)

## 2021-11-22 NOTE — Progress Notes (Signed)
Your lab work is within acceptable range and there are no concerning findings.   ?

## 2021-11-22 NOTE — Progress Notes (Signed)
Referring-Catherine Metheney MD Reason for referral-palpitations and abnormal electrocardiogram  HPI: 66 year old female for evaluation of palpitations and abnormal electrocardiogram at request of Beatrice Lecher MD.  Echocardiogram March 2020 showed normal LV function, mild aortic insufficiency.  Laboratories May 2023 showed normal troponins, TSH 0.87, potassium 4.0, magnesium 2.2 and normal hemoglobin.  Patient seen at [facility] with complaints of palpitations.  By report electrocardiogram showed accelerated junctional rhythm with rate of 74 but I do not have that electrocardiogram for review.  Cardiology now asked to evaluate.  Patient typically does not have dyspnea on exertion, orthopnea, PND, pedal edema, exertional chest pain or syncope.  Recently she has had occasional palpitations described as her heart pounding at times.  She notices this at night and her heart rate will be 90.  She has these on a daily basis with no other associated symptoms.  She feels they may be worse when she has abdominal discomfort related to her ulcerative colitis.  Cardiology now asked to evaluate.  Current Outpatient Medications  Medication Sig Dispense Refill   BORON PO Take by mouth.     Calcium Carbonate-Vit D-Min (CALCIUM 1200 PO) Take by mouth.       cholecalciferol (VITAMIN D) 1000 units tablet Take 1,000 Units by mouth daily.     LUTEIN PO Take 1 tablet by mouth daily.     meloxicam (MOBIC) 15 MG tablet One tab PO qAM with a meal for 2 weeks, then daily prn pain. 30 tablet 3   mesalamine (LIALDA) 1.2 g EC tablet Take 1.2 g by mouth in the morning and at bedtime.     Omega-3 Fatty Acids (FISH OIL PO) Take 2 capsules by mouth daily.     orphenadrine (NORFLEX) 100 MG tablet Take 1 tablet (100 mg total) by mouth 2 (two) times daily as needed for muscle spasms. 60 tablet 3   VITAMIN K PO Take by mouth.     No current facility-administered medications for this visit.    No Known  Allergies   Past Medical History:  Diagnosis Date   Anxiety    Crohn disease ([place])    Dr. Laverta Baltimore   Depression    on prozac previously   Hyperlipidemia    Miscarriage    Ulcerative colitis [facility])     Past Surgical History:  Procedure Laterality Date   CARDIAC SURGERY     as a child   EYE SURGERY  10/30/2009   repair of detached retina   TONSILLECTOMY  1965    Social History   Socioeconomic History   Marital status: Married    Spouse name: [name]   Number of children: 3   Years of education: Not on file   Highest education level: Not on file  Occupational History   Occupation: Network engineer    Comment: [employer]  Tobacco Use   Smoking status: Former    Types: Cigarettes    Quit date: 06/16/1974    Years since quitting: 47.4   Smokeless tobacco: Never  Substance and Sexual Activity   Alcohol use: Yes    Alcohol/week: 7.0 standard drinks of alcohol    Types: 7 Glasses of wine per week    Comment: Occasional   Drug use: No   Sexual activity: Not on file  Other Topics Concern   Not on file  Social History Narrative   Lives with husband.Regular exercise-no.   Social Determinants of Health   Financial Resource Strain: Not on file  Food Insecurity:  Not on file  Transportation Needs: Not on file  Physical Activity: Not on file  Stress: Not on file  Social Connections: Not on file  Intimate Partner Violence: Not on file    Family History  Problem Relation Age of Onset   Alzheimer's disease Mother     ROS: no fevers or chills, productive cough, hemoptysis, dysphasia, odynophagia, melena, hematochezia, dysuria, hematuria, rash, seizure activity, orthopnea, PND, pedal edema, claudication. Remaining systems are negative.  Physical Exam:   Blood pressure 134/82, pulse 62, height 5' 9"  (1.753 m), weight 128 lb 1.3 oz (58.1 kg), last menstrual period 06/11/2000, SpO2 99 %.  General:  Well developed/well nourished in NAD Skin warm/dry Patient not depressed No  peripheral clubbing Back-normal HEENT-normal/normal eyelids Neck supple/normal carotid upstroke bilaterally; no bruits; no JVD; no thyromegaly chest - CTA/ normal expansion CV - RRR/normal S1 and S2; no murmurs, rubs or gallops;  PMI nondisplaced Abdomen -NT/ND, no HSM, no mass, + bowel sounds, no bruit 2+ femoral pulses, no bruits Ext-no edema, chords, 2+ DP Neuro-grossly nonfocal  ECG - 11/21/21 sinus bradycardia, first degree AV block; personally reviewed  A/P  1 palpitations-etiology unclear.  We will arrange a 3-day Zio patch to further assess.  Schedule echocardiogram to assess LV function.  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  I do not have those details and the echocardiogram will better assess.  There are no significant murmurs on examination.  2 abnormal ECG-there is an ECG dated Nov 13, 2021 at [facility] that has been interpreted as accelerated junctional rhythm at a rate of 74.  We will obtain a copy of that to review.  3 history of mild aortic insufficiency-reassess with follow-up echocardiogram.  Kirk Ruths, MD

## 2021-11-26 NOTE — Addendum Note (Signed)
Addended by: Narda Rutherford on: 11/26/2021 11:30 AM   Modules accepted: Orders

## 2021-11-29 DIAGNOSIS — K50014 Crohn's disease of small intestine with abscess: Secondary | ICD-10-CM | POA: Diagnosis not present

## 2021-11-29 DIAGNOSIS — K224 Dyskinesia of esophagus: Secondary | ICD-10-CM | POA: Diagnosis not present

## 2021-11-29 DIAGNOSIS — K219 Gastro-esophageal reflux disease without esophagitis: Secondary | ICD-10-CM | POA: Diagnosis not present

## 2021-12-02 ENCOUNTER — Ambulatory Visit: Payer: Medicare HMO | Admitting: Cardiology

## 2021-12-02 ENCOUNTER — Encounter: Payer: Self-pay | Admitting: Cardiology

## 2021-12-02 VITALS — BP 134/82 | HR 62 | Ht 69.0 in | Wt 128.1 lb

## 2021-12-02 DIAGNOSIS — R002 Palpitations: Secondary | ICD-10-CM | POA: Diagnosis not present

## 2021-12-02 DIAGNOSIS — R9431 Abnormal electrocardiogram [ECG] [EKG]: Secondary | ICD-10-CM

## 2021-12-02 NOTE — Patient Instructions (Signed)
Testing/Procedures:  Your physician has requested that you have an echocardiogram. Echocardiography is a painless test that uses sound waves to create images of your heart. It provides your doctor with information about the size and shape of your heart and how well your heart's chambers and valves are working. This procedure takes approximately one hour. There are no restrictions for this procedure. 1 ST FLOOR IMAGING DEPARTMENT IN [place] [facility]  ZIO XT- Long Term Monitor Instructions  Your physician has requested you wear a ZIO patch monitor for 3 days.  This is a single patch monitor. [employer] supplies one patch monitor per enrollment. Additional stickers are not available. Please do not apply patch if you will be having a Nuclear Stress Test,  Echocardiogram, Cardiac CT, MRI, or Chest Xray during the period you would be wearing the  monitor. The patch cannot be worn during these tests. You cannot remove and re-apply the  ZIO XT patch monitor.  Your ZIO patch monitor will be mailed 3 day USPS to your address on file. It may take 3-5 days  to receive your monitor after you have been enrolled.  Once you have received your monitor, please review the enclosed instructions. Your monitor  has already been registered assigning a specific monitor serial # to you.  Billing and Patient Assistance Program Information  We have supplied [employer] with any of your insurance information on file for billing purposes. [employer] offers a sliding scale Patient Assistance Program for patients that do not have  insurance, or whose insurance does not completely cover the cost of the ZIO monitor.  You must apply for the Patient Assistance Program to qualify for this discounted rate.  To apply, please call [employer] at [phone], select option 4, select option 2, ask to apply for  Patient Assistance Program. Theodore Demark will ask your household income, and how many people  are in your household. They will quote your  out-of-pocket cost based on that information.  [employer] will also be able to set up a 42-month interest-free payment plan if needed.  Applying the monitor   Shave hair from upper left chest.  Hold abrader disc by orange tab. Rub abrader in 40 strokes over the upper left chest as  indicated in your monitor instructions.  Clean area with 4 enclosed alcohol pads. Let dry.  Apply patch as indicated in monitor instructions. Patch will be placed under collarbone on left  side of chest with arrow pointing upward.  Rub patch adhesive wings for 2 minutes. Remove white label marked "1". Remove the white  label marked "2". Rub patch adhesive wings for 2 additional minutes.  While looking in a mirror, press and release button in center of patch. A small green light will  flash 3-4 times. This will be your only indicator that the monitor has been turned on.  Do not shower for the first 24 hours. You may shower after the first 24 hours.  Press the button if you feel a symptom. You will hear a small click. Record Date, Time and  Symptom in the Patient Logbook.  When you are ready to remove the patch, follow instructions on the last 2 pages of Patient  Logbook. Stick patch monitor onto the last page of Patient Logbook.  Place Patient Logbook in the blue and white box. Use locking tab on box and tape box closed  securely. The blue and white box has prepaid postage on it. Please place it in the mailbox as  soon as possible.  Your physician should have your test results approximately 7 days after the  monitor has been mailed back to [facility].  Call [facility] at [phone] if you have questions regarding  your ZIO XT patch monitor. Call them immediately if you see an orange light blinking on your  monitor.  If your monitor falls off in less than 4 days, contact our Monitor department at [phone].  If your monitor becomes loose or falls off after 4 days call [employer] at  [phone] for  suggestions on securing your monitor    Follow-Up: At [facility], you and your health needs are our priority.  As part of our continuing mission to provide you with exceptional heart care, we have created designated Provider Care Teams.  These Care Teams include your primary Cardiologist (physician) and Advanced Practice Providers (APPs -  Physician Assistants and Nurse Practitioners) who all work together to provide you with the care you need, when you need it.  We recommend signing up for the patient portal called "MyChart".  Sign up information is provided on this After Visit Summary.  MyChart is used to connect with patients for Virtual Visits (Telemedicine).  Patients are able to view lab/test results, encounter notes, upcoming appointments, etc.  Non-urgent messages can be sent to your provider as well.   To learn more about what you can do with MyChart, go to [email or web address].    Your next appointment:   3 month(s)  The format for your next appointment:   In Person  Provider:   Kirk Ruths, MD{   Important Information About Sugar

## 2021-12-03 ENCOUNTER — Ambulatory Visit (INDEPENDENT_AMBULATORY_CARE_PROVIDER_SITE_OTHER): Payer: Medicare HMO

## 2021-12-03 ENCOUNTER — Ambulatory Visit (HOSPITAL_BASED_OUTPATIENT_CLINIC_OR_DEPARTMENT_OTHER)
Admission: RE | Admit: 2021-12-03 | Discharge: 2021-12-03 | Disposition: A | Discharge status: Home / Self Care | Payer: Medicare HMO | Source: Ambulatory Visit | Attending: Cardiology | Admitting: Cardiology

## 2021-12-03 DIAGNOSIS — I351 Nonrheumatic aortic (valve) insufficiency: Secondary | ICD-10-CM | POA: Diagnosis not present

## 2021-12-03 DIAGNOSIS — E785 Hyperlipidemia, unspecified: Secondary | ICD-10-CM | POA: Insufficient documentation

## 2021-12-03 DIAGNOSIS — R002 Palpitations: Secondary | ICD-10-CM | POA: Diagnosis not present

## 2021-12-03 DIAGNOSIS — R9431 Abnormal electrocardiogram [ECG] [EKG]: Secondary | ICD-10-CM | POA: Diagnosis not present

## 2021-12-03 DIAGNOSIS — Z8774 Personal history of (corrected) congenital malformations of heart and circulatory system: Secondary | ICD-10-CM | POA: Diagnosis not present

## 2021-12-03 LAB — ECHOCARDIOGRAM COMPLETE
AR max vel: 2.29 cm2
AV Area VTI: 2.04 cm2
AV Area mean vel: 2.11 cm2
AV Mean grad: 3 mmHg
AV Peak grad: 6.3 mmHg
Ao pk vel: 1.25 m/s
Area-P 1/2: 2.86 cm2
P 1/2 time: 554 msec
S' Lateral: 3.1 cm

## 2021-12-03 NOTE — Progress Notes (Signed)
  Echocardiogram 2D Echocardiogram has been performed.  Merrie Roof F 12/03/2021, 9:46 AM

## 2021-12-03 NOTE — Progress Notes (Unsigned)
Enrolled for [employer] to mail a ZIO XT long term holter monitor to the patients address on file.  

## 2021-12-05 ENCOUNTER — Encounter: Payer: Self-pay | Admitting: *Deleted

## 2021-12-05 DIAGNOSIS — R002 Palpitations: Secondary | ICD-10-CM | POA: Diagnosis not present

## 2021-12-12 DIAGNOSIS — R002 Palpitations: Secondary | ICD-10-CM | POA: Diagnosis not present

## 2021-12-18 DIAGNOSIS — R1319 Other dysphagia: Secondary | ICD-10-CM | POA: Diagnosis not present

## 2021-12-18 DIAGNOSIS — K582 Mixed irritable bowel syndrome: Secondary | ICD-10-CM | POA: Diagnosis not present

## 2021-12-18 DIAGNOSIS — K5100 Ulcerative (chronic) pancolitis without complications: Secondary | ICD-10-CM | POA: Diagnosis not present

## 2022-02-05 ENCOUNTER — Encounter: Payer: Self-pay | Admitting: General Practice

## 2022-02-19 DIAGNOSIS — H35411 Lattice degeneration of retina, right eye: Secondary | ICD-10-CM | POA: Diagnosis not present

## 2022-02-19 DIAGNOSIS — H31093 Other chorioretinal scars, bilateral: Secondary | ICD-10-CM | POA: Diagnosis not present

## 2022-02-19 DIAGNOSIS — H43811 Vitreous degeneration, right eye: Secondary | ICD-10-CM | POA: Diagnosis not present

## 2022-02-19 DIAGNOSIS — H35373 Puckering of macula, bilateral: Secondary | ICD-10-CM | POA: Diagnosis not present

## 2022-02-26 NOTE — Progress Notes (Deleted)
HPI: FU palpitations and abnormal electrocardiogram. Patient seen at [facility] May 23 with complaints of palpitations.  By report electrocardiogram showed accelerated junctional rhythm with rate of 74 but I do not have that electrocardiogram for review.  Echocardiogram June 2023 showed normal LV function, mild aortic insufficiency.  Monitor June 2023 showed sinus rhythm with rare PAC, occasional PVC, rare couplet, 17 beats nonsustained ventricular tachycardia and transient Mobitz 1 second-degree AV block at 2:07 AM.  Since last seen  Current Outpatient Medications  Medication Sig Dispense Refill   BORON PO Take by mouth.     Calcium Carbonate-Vit D-Min (CALCIUM 1200 PO) Take by mouth.       cholecalciferol (VITAMIN D) 1000 units tablet Take 1,000 Units by mouth daily.     LUTEIN PO Take 1 tablet by mouth daily.     meloxicam (MOBIC) 15 MG tablet One tab PO qAM with a meal for 2 weeks, then daily prn pain. 30 tablet 3   mesalamine (LIALDA) 1.2 g EC tablet Take 1.2 g by mouth in the morning and at bedtime.     Omega-3 Fatty Acids (FISH OIL PO) Take 2 capsules by mouth daily.     orphenadrine (NORFLEX) 100 MG tablet Take 1 tablet (100 mg total) by mouth 2 (two) times daily as needed for muscle spasms. 60 tablet 3   VITAMIN K PO Take by mouth.     No current facility-administered medications for this visit.     Past Medical History:  Diagnosis Date   Anxiety    Crohn disease ([place])    Dr. Laverta Baltimore   Depression    on prozac previously   Hyperlipidemia    Miscarriage    Ulcerative colitis [facility])     Past Surgical History:  Procedure Laterality Date   CARDIAC SURGERY     as a child   EYE SURGERY  10/30/2009   repair of detached retina   TONSILLECTOMY  1965    Social History   Socioeconomic History   Marital status: Married    Spouse name: [name]   Number of children: 3   Years of education: Not on file   Highest education level: Not on file  Occupational History   Occupation:  Network engineer    Comment: [employer]  Tobacco Use   Smoking status: Former    Types: Cigarettes    Quit date: 06/16/1974    Years since quitting: 47.7   Smokeless tobacco: Never  Substance and Sexual Activity   Alcohol use: Yes    Alcohol/week: 7.0 standard drinks of alcohol    Types: 7 Glasses of wine per week    Comment: Occasional   Drug use: No   Sexual activity: Not on file  Other Topics Concern   Not on file  Social History Narrative   Lives with husband.Regular exercise-no.   Social Determinants of Health   Financial Resource Strain: Not on file  Food Insecurity: Not on file  Transportation Needs: Not on file  Physical Activity: Not on file  Stress: Not on file  Social Connections: Not on file  Intimate Partner Violence: Not on file    Family History  Problem Relation Age of Onset   Alzheimer's disease Mother     ROS: no fevers or chills, productive cough, hemoptysis, dysphasia, odynophagia, melena, hematochezia, dysuria, hematuria, rash, seizure activity, orthopnea, PND, pedal edema, claudication. Remaining systems are negative.  Physical Exam: Well-developed well-nourished in no acute distress.  Skin is warm and dry.  HEENT is normal.  Neck is supple.  Chest is clear to auscultation with normal expansion.  Cardiovascular exam is regular rate and rhythm.  Abdominal exam nontender or distended. No masses palpated. Extremities show no edema. neuro grossly intact  ECG- personally reviewed  A/P  1 palpitations-  2 history of abnormal ECG demonstrating accelerated junctional rhythm-we will continue to try to obtain copy for review.  3 aortic insufficiency-mild on most recent echocardiogram.  Kirk Ruths, MD

## 2022-03-05 ENCOUNTER — Ambulatory Visit: Payer: Medicare HMO | Admitting: Cardiology

## 2022-03-24 ENCOUNTER — Ambulatory Visit (INDEPENDENT_AMBULATORY_CARE_PROVIDER_SITE_OTHER): Payer: Medicare HMO | Admitting: Family Medicine

## 2022-03-24 VITALS — BP 114/64 | HR 57 | Ht 69.0 in | Wt 128.4 lb

## 2022-03-24 DIAGNOSIS — Z0000 Encounter for general adult medical examination without abnormal findings: Secondary | ICD-10-CM

## 2022-03-24 DIAGNOSIS — Z131 Encounter for screening for diabetes mellitus: Secondary | ICD-10-CM | POA: Diagnosis not present

## 2022-03-24 DIAGNOSIS — Z23 Encounter for immunization: Secondary | ICD-10-CM

## 2022-03-24 DIAGNOSIS — Z13220 Encounter for screening for lipoid disorders: Secondary | ICD-10-CM

## 2022-03-24 DIAGNOSIS — Z1231 Encounter for screening mammogram for malignant neoplasm of breast: Secondary | ICD-10-CM

## 2022-03-24 NOTE — Progress Notes (Signed)
Complete physical exam  Patient: Diane Chavez   DOB: [birth year]   66 y.o. Female  MRN: [id number]  Subjective:    Chief Complaint  Patient presents with   Annual Exam   Hip Pain    Pt also c/o left hip and low back pain after exercising ( working out) on Thursday 03/20/2022    Diane Chavez is a 66 y.o. female who presents today for a complete physical exam. She reports consuming a general diet.  Started exercising at [employer] sne[employer].  She generally feels well. She reports sleeping fairly well. She does not have additional problems to discuss today.  In [employer] sneakers and has been starting to do some regular exercise she recently started to do some weight training but feels like she injured her low back and left outer hip area its been sore to stand or walk since then.  She is planning on retiring in 5 months!!!!! She is really excited about it!!     Most recent fall risk assessment:    03/24/2022    2:17 PM  [place] in the past year? 0  Number falls in past yr: 0  Injury with Fall? 0  Risk for fall due to : No Fall Risks     Most recent depression screenings:    03/24/2022    4:00 PM 11/21/2021    1:09 PM  PHQ 2/9 Scores  PHQ - 2 Score 0 0  PHQ- 9 Score 2       Past Medical History:  Diagnosis Date   Anxiety    Crohn disease ([place])    Dr. Laverta [place]   Depression    on prozac previously   Hyperlipidemia    Miscarriage    Ulcerative colitis ([place])    Past Surgical History:  Procedure Laterality Date   CARDIAC SURGERY     as a child   EYE SURGERY  10/30/2009   repair of detached retina   [place]   No Known Allergies    Patient Care Team: [name], MD as PCP - General Rosana Berger, MD as Referring Physician (Gastroenterology)   Outpatient Medications Prior to Visit  Medication Sig   ALPRAZolam (XANAX) 0.25 MG tablet Take 0.25 mg by mouth 3 (three) times daily as needed.   Calcium Carbonate-Vit D-Min (CALCIUM 1200 PO)  Take by mouth.     cholecalciferol (VITAMIN D) 1000 units tablet Take 1,000 Units by mouth daily.   dicyclomine (BENTYL) 10 MG capsule    LUTEIN PO Take 1 tablet by mouth daily.   meloxicam (MOBIC) 15 MG tablet One tab PO qAM with a meal for 2 weeks, then daily prn pain.   Omega-3 Fatty Acids (FISH OIL PO) Take 2 capsules by mouth daily.   orphenadrine (NORFLEX) 100 MG tablet Take 1 tablet (100 mg total) by mouth 2 (two) times daily as needed for muscle spasms.   VITAMIN K PO Take by mouth.   mesalamine (APRISO) 0.375 g 24 hr capsule  (Patient not taking: Reported on 03/24/2022)   mesalamine (LIALDA) 1.2 g EC tablet Take 1.2 g by mouth in the morning and at bedtime. (Patient not taking: Reported on 03/24/2022)   [DISCONTINUED] BORON PO Take by mouth.   No facility-administered medications prior to visit.    ROS        Objective:     BP 114/64   Pulse (!) 57   Ht 5' 9"  (1.753 m)  Wt 128 lb 6.4 oz (58.2 kg)   LMP 06/11/2000   SpO2 99%   BMI 18.96 kg/m     Physical Exam Vitals and nursing note reviewed.  Constitutional:      Appearance: She is well-developed.  HENT:     Head: Normocephalic and atraumatic.     Right Ear: Tympanic membrane, ear canal and external ear normal.     Left Ear: Tympanic membrane, ear canal and external ear normal.     Nose: Nose normal.     Mouth/Throat:     Pharynx: Oropharynx is clear.  Eyes:     Conjunctiva/sclera: Conjunctivae normal.     Pupils: Pupils are equal, round, and reactive to light.  Neck:     Thyroid: No thyromegaly.  Cardiovascular:     Rate and Rhythm: Normal rate and regular rhythm.     Heart sounds: Normal heart sounds.  Pulmonary:     Effort: Pulmonary effort is normal.     Breath sounds: Normal breath sounds. No wheezing.  Abdominal:     General: Bowel sounds are normal.     Palpations: Abdomen is soft.  Musculoskeletal:     Cervical back: Neck supple.     Comments: Tender over the lumbar spine but she is little  tender over the left greater trochanter.  Lymphadenopathy:     Cervical: No cervical adenopathy.  Skin:    General: Skin is warm and dry.  Neurological:     Mental Status: She is alert and oriented to person, place, and time.  Psychiatric:        Behavior: Behavior normal.      No results found for any visits on 03/24/22.     Assessment & Plan:    Routine Health Maintenance and Physical Exam  Keep up a regular exercise program and make sure you are eating a healthy diet Try to eat 4 servings of dairy a day, or if you are lactose intolerant take a calcium with vitamin D daily.  Your vaccines are up to date.    Immunization History  Administered Date(s) Administered   Fluad Quad(high Dose 65+) 03/24/2022   Influenza,inj,Quad PF,6+ Mos 02/14/2017, 03/23/2019, 03/28/2020   Influenza,inj,quad, With Preservative 04/06/2018   Influenza-Unspecified 03/30/2013, 02/25/2014, 05/14/2015, 02/26/2016, 03/16/2021   PFIZER(Purple Top)SARS-COV-2 Vaccination 09/22/2019, 10/13/2019, 02/01/2021   PNEUMOCOCCAL CONJUGATE-20 02/05/2021   Td 06/16/2000, 10/31/2009   Td (Adult), 2 Lf Tetanus Toxid, Preservative Free 06/16/2000, 10/31/2009   Tdap 04/23/2020   Zoster Recombinat (Shingrix) 04/23/2020, 02/01/2021    Health Maintenance  Topic Date Due   MAMMOGRAM  02/27/2022   COVID-19 Vaccine (4 - Pfizer risk series) 03/25/2023 (Originally 03/29/2021)   PAP SMEAR-Modifier  04/23/2025   COLONOSCOPY (Pts 45-65yr Insurance coverage will need to be confirmed)  08/07/2029   TETANUS/TDAP  04/23/2030   Pneumonia Vaccine 65 Years old  Completed   INFLUENZA VACCINE  Completed   DEXA SCAN  Completed   Hepatitis C Screening  Completed   HIV Screening  Completed   Zoster Vaccines- Shingrix  Completed   HPV VACCINES  Aged Out    Discussed health benefits of physical activity, and encouraged her to engage in regular exercise appropriate for her age and condition.  Problem List Items Addressed This  Visit   None Visit Diagnoses     Wellness examination    -  Primary   Relevant Orders   Lipid Panel w/reflex Direct LDL   COMPLETE METABOLIC PANEL WITH GFR   CBC with Differential/Platelet  Diabetes mellitus screening       Relevant Orders   COMPLETE METABOLIC PANEL WITH GFR   Lipid screening       Relevant Orders   Lipid Panel w/reflex Direct LDL   Need for immunization against influenza       Relevant Orders   Flu Vaccine QUAD High Dose(Fluad) (Completed)   Encounter for screening mammogram for malignant neoplasm of breast       Relevant Orders   MM 3D SCREEN BREAST BILATERAL       Left hip pain-he is tender over the greater trochanter.  We discussed using ice gentle stretches and if not improving over the next several weeks to please let us know.   Return in about 1 year (around 03/25/2023) for Wellness Exam.     Beatrice Lecher, MD

## 2022-03-26 DIAGNOSIS — E7800 Pure hypercholesterolemia, unspecified: Secondary | ICD-10-CM | POA: Diagnosis not present

## 2022-03-26 DIAGNOSIS — Z0000 Encounter for general adult medical examination without abnormal findings: Secondary | ICD-10-CM | POA: Diagnosis not present

## 2022-03-26 DIAGNOSIS — Z13220 Encounter for screening for lipoid disorders: Secondary | ICD-10-CM | POA: Diagnosis not present

## 2022-03-26 DIAGNOSIS — Z131 Encounter for screening for diabetes mellitus: Secondary | ICD-10-CM | POA: Diagnosis not present

## 2022-03-27 ENCOUNTER — Ambulatory Visit (INDEPENDENT_AMBULATORY_CARE_PROVIDER_SITE_OTHER): Payer: Medicare HMO

## 2022-03-27 DIAGNOSIS — Z1231 Encounter for screening mammogram for malignant neoplasm of breast: Secondary | ICD-10-CM | POA: Diagnosis not present

## 2022-03-27 LAB — CBC WITH DIFFERENTIAL/PLATELET
Absolute Monocytes: 445 cells/uL (ref 200–950)
Basophils Absolute: 29 cells/uL (ref 0–200)
Basophils Relative: 0.7 %
Eosinophils Absolute: 168 cells/uL (ref 15–500)
Eosinophils Relative: 4 %
HCT: 43.4 % (ref 35.0–45.0)
Hemoglobin: 14.3 g/dL (ref 11.7–15.5)
Lymphs Abs: 1247 cells/uL (ref 850–3900)
MCH: 31.1 pg (ref 27.0–33.0)
MCHC: 32.9 g/dL (ref 32.0–36.0)
MCV: 94.3 fL (ref 80.0–100.0)
MPV: 9.4 fL (ref 7.5–12.5)
Monocytes Relative: 10.6 %
Neutro Abs: 2310 cells/uL (ref 1500–7800)
Neutrophils Relative %: 55 %
Platelets: 198 10*3/uL (ref 140–400)
RBC: 4.6 10*6/uL (ref 3.80–5.10)
RDW: 11.9 % (ref 11.0–15.0)
Total Lymphocyte: 29.7 %
WBC: 4.2 10*3/uL (ref 3.8–10.8)

## 2022-03-27 LAB — LIPID PANEL W/REFLEX DIRECT LDL
Cholesterol: 233 mg/dL — ABNORMAL HIGH (ref <200)
HDL: 88 mg/dL (ref >=50)
LDL Cholesterol (Calc): 130 mg/dL (calc) — ABNORMAL HIGH
Non-HDL Cholesterol (Calc): 145 mg/dL (calc) — ABNORMAL HIGH (ref <130)
Total CHOL/HDL Ratio: 2.6 (calc) (ref <5.0)
Triglycerides: 54 mg/dL (ref <150)

## 2022-03-27 LAB — COMPLETE METABOLIC PANEL WITH GFR
AG Ratio: 1.8 (calc) (ref 1.0–2.5)
ALT: 12 U/L (ref 6–29)
AST: 21 U/L (ref 10–35)
Albumin: 4.7 g/dL (ref 3.6–5.1)
Alkaline phosphatase (APISO): 67 U/L (ref 37–153)
BUN: 9 mg/dL (ref 7–25)
CO2: 29 mmol/L (ref 20–32)
Calcium: 9.8 mg/dL (ref 8.6–10.4)
Chloride: 99 mmol/L (ref 98–110)
Creat: 0.72 mg/dL (ref 0.50–1.05)
Globulin: 2.6 g/dL (calc) (ref 1.9–3.7)
Glucose, Bld: 90 mg/dL (ref 65–99)
Potassium: 4.4 mmol/L (ref 3.5–5.3)
Sodium: 136 mmol/L (ref 135–146)
Total Bilirubin: 0.5 mg/dL (ref 0.2–1.2)
Total Protein: 7.3 g/dL (ref 6.1–8.1)
eGFR: 93 mL/min/{1.73_m2} (ref >=60)

## 2022-03-27 NOTE — Progress Notes (Signed)
Hi Diane Chavez, LDL cholesterol is 130 so it is a little elevated but similar to what its been over the last 4 years.  I just think genetically you have high cholesterol.  Your blood count is normal.  The metabolic panel looks great.  The 10-year ASCVD risk score (Arnett DK, et al., 2019) is: 4%   Values used to calculate the score:     Age: 66 years     Sex: Female     Is Non-Hispanic African American: No     Diabetic: No     Tobacco smoker: No     Systolic Blood Pressure: 546 mmHg     Is BP treated: No     HDL Cholesterol: 88 mg/dL     Total Cholesterol: 233 mg/dL

## 2022-03-29 NOTE — Progress Notes (Signed)
Please call patient. Normal mammogram.  Repeat in 1 year.  

## 2022-04-08 DIAGNOSIS — K5100 Ulcerative (chronic) pancolitis without complications: Secondary | ICD-10-CM | POA: Diagnosis not present

## 2022-04-08 DIAGNOSIS — R1319 Other dysphagia: Secondary | ICD-10-CM | POA: Diagnosis not present

## 2022-04-08 DIAGNOSIS — K582 Mixed irritable bowel syndrome: Secondary | ICD-10-CM | POA: Diagnosis not present

## 2022-04-21 ENCOUNTER — Ambulatory Visit
Admission: RE | Admit: 2022-04-21 | Discharge: 2022-04-21 | Disposition: A | Discharge status: Home / Self Care | Payer: Medicare HMO | Source: Ambulatory Visit | Attending: Family Medicine | Admitting: Family Medicine

## 2022-04-21 VITALS — BP 120/78 | HR 68 | Temp 99.0°F | Resp 12

## 2022-04-21 DIAGNOSIS — R42 Dizziness and giddiness: Secondary | ICD-10-CM

## 2022-04-21 MED ORDER — MECLIZINE HCL 12.5 MG PO TABS: 12.5000 mg | ORAL_TABLET | Freq: Three times a day (TID) | ORAL | 0 refills | Status: DC | PRN

## 2022-04-21 MED ORDER — PROMETHAZINE HCL 25 MG PO TABS: 25.0000 mg | ORAL_TABLET | Freq: Four times a day (QID) | ORAL | 0 refills | Status: DC | PRN

## 2022-04-21 NOTE — ED Triage Notes (Signed)
Pt presents with c/o dizziness that began this morning.

## 2022-04-21 NOTE — ED Provider Notes (Signed)
[facility]    CSN: [id number] Arrival date & time: 04/21/22  1336      History   Chief Complaint Chief Complaint  Patient presents with   Dizziness    Vertigo.  Can't walk very straight.  Moving my head causes dizziness - Entered by patient    HPI JOZELYN Chavez is a 66 y.o. female.   HPI  Patient has a history of vertigo in the past.  She woke up this morning and had a spinning sensation and nausea.  She recognizes this is vertigo.  No cold symptoms or congestion.  No recent fever or illness.  No head injury.  No recent travel.  She does not have any chest pain or palpitations, shortness of breath, visual change  Past Medical History:  Diagnosis Date   Anxiety    Crohn disease ([place])    Dr. Laverta [place]   Depression    on prozac previously   Hyperlipidemia    Miscarriage    Ulcerative colitis [facility])     Patient Active Problem List   Diagnosis Date Noted   DDD (degenerative disc disease), cervical 08/22/2021   Osteoarthritis of right acromioclavicular joint 07/26/2021   Memory change 02/05/2021   Lumbar degenerative disc disease 10/04/2018   Acute shoulder bursitis, left 05/26/2018   Left knee swelling with Baker's cyst 09/30/2017   Status post cardiac surgery 09/05/2013   Epiretinal membrane 12/10/2012   Osteoporosis 10/10/2010   BENIGN POSITIONAL VERTIGO 04/05/2010   Regional enteritis ([place]) 07/30/2009   HEADACHE 07/30/2009   Headache 07/30/2009   POSTMENOPAUSAL STATUS 04/25/2009   ANXIETY 09/20/2007   Insomnia 09/20/2007    Past Surgical History:  Procedure Laterality Date   CARDIAC SURGERY     as a child   EYE SURGERY  10/30/2009   repair of detached retina   TONSILLECTOMY  1965    OB History   No obstetric history on file.      Home Medications    Prior to Admission medications   Medication Sig Start Date End Date Taking? Authorizing Provider  meclizine (ANTIVERT) 12.5 MG tablet Take 1 tablet (12.5 mg total) by mouth 3 (three) times  daily as needed for dizziness. 04/21/22  Yes Raylene Everts, MD  promethazine (PHENERGAN) 25 MG tablet Take 1 tablet (25 mg total) by mouth every 6 (six) hours as needed for nausea or vomiting. 04/21/22  Yes Raylene Everts, MD  ALPRAZolam Duanne Moron) 0.25 MG tablet Take 0.25 mg by mouth 3 (three) times daily as needed. 11/14/21   [provider]  Calcium Carbonate-Vit D-Min (CALCIUM 1200 PO) Take by mouth.      [provider]  cholecalciferol (VITAMIN D) 1000 units tablet Take 1,000 Units by mouth daily.    [provider]  dicyclomine (BENTYL) 10 MG capsule  12/18/21   [provider]  LUTEIN PO Take 1 tablet by mouth daily.    [provider]  meloxicam (MOBIC) 15 MG tablet One tab PO qAM with a meal for 2 weeks, then daily prn pain. 07/26/21   Silverio Decamp, MD  orphenadrine (NORFLEX) 100 MG tablet Take 1 tablet (100 mg total) by mouth 2 (two) times daily as needed for muscle spasms. 08/19/21   [name], MD  VITAMIN K PO Take by mouth.    [provider]    Family History Family History  Problem Relation Age of Onset   Alzheimer's disease Mother     Social History Social  History   Tobacco Use   Smoking status: Former    Types: Cigarettes    Quit date: 06/16/1974    Years since quitting: 47.8   Smokeless tobacco: Never  Substance Use Topics   Alcohol use: Yes    Alcohol/week: 7.0 standard drinks of alcohol    Types: 7 Glasses of wine per week    Comment: Occasional   Drug use: No     Allergies   Patient has no known allergies.   Review of Systems Review of Systems  See HPI Physical Exam Triage Vital Signs ED Triage Vitals  Enc Vitals Group     BP 04/21/22 1349 120/78     Pulse Rate 04/21/22 1349 68     Resp 04/21/22 1349 12     Temp 04/21/22 1349 99 F (37.2 C)     Temp Source 04/21/22 1349 Oral     SpO2 04/21/22 1349 97 %     Weight --      Height --      Head Circumference --      Peak  Flow --      Pain Score 04/21/22 1347 0     Pain Loc --      Pain Edu? --      Excl. in [place]? --    No data found.  Updated Vital Signs BP 120/78 (BP Location: Right Arm)   Pulse 68   Temp 99 F (37.2 C) (Oral)   Resp 12   LMP 06/11/2000   SpO2 97%       Physical Exam Constitutional:      General: She is not in acute distress.    Appearance: She is well-developed and normal weight. She is ill-appearing.  HENT:     Head: Normocephalic and atraumatic.     Right Ear: Tympanic membrane and ear canal normal.     Left Ear: Tympanic membrane and ear canal normal.     Nose: Nose normal. No congestion.     Mouth/Throat:     Mouth: Mucous membranes are moist.     Pharynx: No posterior oropharyngeal erythema.  Eyes:     Conjunctiva/sclera: Conjunctivae normal.     Pupils: Pupils are equal, round, and reactive to light.     Comments: +nystagmus  Neck:     Vascular: No carotid bruit.  Cardiovascular:     Rate and Rhythm: Normal rate and regular rhythm.     Heart sounds: Normal heart sounds.  Pulmonary:     Effort: Pulmonary effort is normal. No respiratory distress.  Abdominal:     General: There is no distension.     Palpations: Abdomen is soft.  Musculoskeletal:        General: Normal range of motion.     Cervical back: Normal range of motion.  Lymphadenopathy:     Cervical: No cervical adenopathy.  Skin:    General: Skin is warm and dry.  Neurological:     Mental Status: She is alert.  Psychiatric:        Mood and Affect: Mood normal.        Behavior: Behavior normal.      UC Treatments / Results  Labs (all labs ordered are listed, but only abnormal results are displayed) Labs Reviewed - No data to display  EKG   Radiology No results found.  Procedures Procedures (including critical care time)  Medications Ordered in UC Medications - No data to display  Initial Impression / Assessment and Plan /  UC Course  I have reviewed the triage vital signs and  the nursing notes.  Pertinent labs & imaging results that were available during my care of the patient were reviewed by me and considered in my medical decision making (see chart for details).     Final Clinical Impressions(s) / UC Diagnoses   Final diagnoses:  Vertigo     Discharge Instructions      Make sure you are drinking lots of fluids May take Phenergan up to 4 times a day as needed for nausea.  This can cause drowsiness. May take meclizine 3 times a day for the dizziness. Call your doctor if not improving towards the end of the week   ED Prescriptions     Medication Sig Dispense Auth. Provider   promethazine (PHENERGAN) 25 MG tablet Take 1 tablet (25 mg total) by mouth every 6 (six) hours as needed for nausea or vomiting. 30 tablet Raylene Everts, MD   meclizine (ANTIVERT) 12.5 MG tablet Take 1 tablet (12.5 mg total) by mouth 3 (three) times daily as needed for dizziness. 30 tablet Raylene Everts, MD      PDMP not reviewed this encounter.   Raylene Everts, MD 04/21/22 [phone]

## 2022-04-21 NOTE — Discharge Instructions (Signed)
Make sure you are drinking lots of fluids May take Phenergan up to 4 times a day as needed for nausea.  This can cause drowsiness. May take meclizine 3 times a day for the dizziness. Call your doctor if not improving towards the end of the week

## 2022-04-24 DIAGNOSIS — M47816 Spondylosis without myelopathy or radiculopathy, lumbar region: Secondary | ICD-10-CM | POA: Diagnosis not present

## 2022-04-24 DIAGNOSIS — M47818 Spondylosis without myelopathy or radiculopathy, sacral and sacrococcygeal region: Secondary | ICD-10-CM | POA: Diagnosis not present

## 2022-04-24 DIAGNOSIS — M4721 Other spondylosis with radiculopathy, occipito-atlanto-axial region: Secondary | ICD-10-CM | POA: Diagnosis not present

## 2022-04-24 DIAGNOSIS — M9904 Segmental and somatic dysfunction of sacral region: Secondary | ICD-10-CM | POA: Diagnosis not present

## 2022-04-24 DIAGNOSIS — M9901 Segmental and somatic dysfunction of cervical region: Secondary | ICD-10-CM | POA: Diagnosis not present

## 2022-04-24 DIAGNOSIS — M9903 Segmental and somatic dysfunction of lumbar region: Secondary | ICD-10-CM | POA: Diagnosis not present

## 2022-04-28 ENCOUNTER — Encounter: Payer: Self-pay | Admitting: Sports Medicine

## 2022-05-07 DIAGNOSIS — M9903 Segmental and somatic dysfunction of lumbar region: Secondary | ICD-10-CM | POA: Diagnosis not present

## 2022-05-07 DIAGNOSIS — M4721 Other spondylosis with radiculopathy, occipito-atlanto-axial region: Secondary | ICD-10-CM | POA: Diagnosis not present

## 2022-05-07 DIAGNOSIS — M47818 Spondylosis without myelopathy or radiculopathy, sacral and sacrococcygeal region: Secondary | ICD-10-CM | POA: Diagnosis not present

## 2022-05-07 DIAGNOSIS — M9904 Segmental and somatic dysfunction of sacral region: Secondary | ICD-10-CM | POA: Diagnosis not present

## 2022-05-07 DIAGNOSIS — M47816 Spondylosis without myelopathy or radiculopathy, lumbar region: Secondary | ICD-10-CM | POA: Diagnosis not present

## 2022-05-07 DIAGNOSIS — M9901 Segmental and somatic dysfunction of cervical region: Secondary | ICD-10-CM | POA: Diagnosis not present

## 2022-06-05 ENCOUNTER — Encounter: Payer: Self-pay | Admitting: Family Medicine

## 2022-07-21 ENCOUNTER — Encounter: Payer: Self-pay | Admitting: Family Medicine

## 2022-07-21 ENCOUNTER — Ambulatory Visit (INDEPENDENT_AMBULATORY_CARE_PROVIDER_SITE_OTHER): Payer: Medicare HMO | Admitting: Family Medicine

## 2022-07-21 VITALS — BP 113/82 | HR 65 | Wt 128.0 lb

## 2022-07-21 DIAGNOSIS — Z0000 Encounter for general adult medical examination without abnormal findings: Secondary | ICD-10-CM | POA: Diagnosis not present

## 2022-07-21 DIAGNOSIS — Z9889 Other specified postprocedural states: Secondary | ICD-10-CM

## 2022-07-21 NOTE — Progress Notes (Unsigned)
Subjective:    Diane Chavez is a 67 y.o. female who presents for a Welcome to Medicare exam.   Review of Systems Negative  Cardiac Risk Factors include: none      Objective:    Today's Vitals   07/21/22 1529  BP: 113/82  Pulse: 65  SpO2: 99%  Weight: 128 lb (58.1 kg)  Body mass index is 18.9 kg/m.  Medications Outpatient Encounter Medications as of 07/21/2022  Medication Sig   ALPRAZolam (XANAX) 0.25 MG tablet Take 0.25 mg by mouth 3 (three) times daily as needed.   Calcium Carbonate-Vit D-Min (CALCIUM 1200 PO) Take by mouth.     cholecalciferol (VITAMIN D) 1000 units tablet Take 1,000 Units by mouth daily.   dicyclomine (BENTYL) 10 MG capsule    LUTEIN PO Take 1 tablet by mouth daily.   meloxicam (MOBIC) 15 MG tablet One tab PO qAM with a meal for 2 weeks, then daily prn pain.   VITAMIN K PO Take by mouth.   [DISCONTINUED] meclizine (ANTIVERT) 12.5 MG tablet Take 1 tablet (12.5 mg total) by mouth 3 (three) times daily as needed for dizziness.   [DISCONTINUED] orphenadrine (NORFLEX) 100 MG tablet Take 1 tablet (100 mg total) by mouth 2 (two) times daily as needed for muscle spasms.   [DISCONTINUED] promethazine (PHENERGAN) 25 MG tablet Take 1 tablet (25 mg total) by mouth every 6 (six) hours as needed for nausea or vomiting.   No facility-administered encounter medications on file as of 07/21/2022.     History: Past Medical History:  Diagnosis Date   Anxiety    Crohn disease ([place])    Dr. Laverta [place]   Depression    on prozac previously   Hyperlipidemia    Miscarriage    Ulcerative colitis [facility])    Past Surgical History:  Procedure Laterality Date   CARDIAC SURGERY     as a child   EYE SURGERY  10/30/2009   repair of detached retina   TONSILLECTOMY  06/17/1963   TUBAL LIGATION  1986    Family History  Problem Relation Age of Onset   Alzheimer's disease Mother    Social History   Occupational History   Occupation: Network engineer    Comment: [employer]   Tobacco Use   Smoking status: Former    Packs/day: 0.00    Years: 1.00    Total pack years: 0.00    Types: Cigarettes    Quit date: 06/16/1974    Years since quitting: 48.1   Smokeless tobacco: Never   Tobacco comments:    I didn't inhale, just trying to look cool for a little while.  Substance and Sexual Activity   Alcohol use: Yes    Alcohol/week: 7.0 standard drinks of alcohol    Types: 7 Glasses of wine per week    Comment: one beer or one glass of wine most evenings   Drug use: No   Sexual activity: Yes    Birth control/protection: Post-menopausal    Tobacco Counseling Counseling given: Not Answered Tobacco comments: I didn't inhale, just trying to look cool for a little while.   Immunizations and Health Maintenance Immunization History  Administered Date(s) Administered   Fluad Quad(high Dose 65+) 03/24/2022   Influenza,inj,Quad PF,6+ Mos 02/14/2017, 03/23/2019, 03/28/2020   Influenza,inj,quad, With Preservative 04/06/2018   Influenza-Unspecified 03/30/2013, 02/25/2014, 05/14/2015, 02/26/2016, 03/16/2021   PFIZER(Purple Top)SARS-COV-2 Vaccination 09/22/2019, 10/13/2019, 02/01/2021   PNEUMOCOCCAL CONJUGATE-20 02/05/2021   RSV,unspecified 06/04/2022   Td 06/16/2000, 10/31/2009   Td (  Adult), 2 Lf Tetanus Toxid, Preservative Free 06/16/2000, 10/31/2009   Tdap 04/23/2020   Zoster Recombinat (Shingrix) 04/23/2020, 02/01/2021   There are no preventive care reminders to display for this patient.   Activities of Daily Living    07/21/2022    3:39 PM 07/21/2022    3:28 PM  In your present state of health, do you have any difficulty performing the following activities:  Hearing? 0 0  Vision? 0 0  Difficulty concentrating or making decisions? 0 0  Walking or climbing stairs? 0 0  Dressing or bathing? 0 0  Doing errands, shopping? 0 0  Preparing Food and eating ?  N  Using the Toilet?  N  In the past six months, have you accidently leaked urine?  N  Do you have  problems with loss of bowel control?  N  Managing your Medications?  N  Managing your Finances?  N  Housekeeping or managing your Housekeeping?  N    Physical Exam  Physical Exam Vitals and nursing note reviewed.  Constitutional:      Appearance: She is well-developed.  HENT:     Head: Normocephalic and atraumatic.  Eyes:     Conjunctiva/sclera: Conjunctivae normal.  Cardiovascular:     Rate and Rhythm: Normal rate and regular rhythm.     Heart sounds: Normal heart sounds.  Pulmonary:     Effort: Pulmonary effort is normal.     Breath sounds: Normal breath sounds.  Skin:    General: Skin is warm and dry.  Neurological:     Mental Status: She is alert and oriented to person, place, and time.  Psychiatric:        Behavior: Behavior normal.    (optional), or other factors deemed appropriate based on the beneficiary's medical and social history and current clinical standards.  Advanced Directives: Does Patient Have a Medical Advance Directive?: Yes Type of Advance Directive: Healthcare Power of Attorney, Living will Does patient want to make changes to medical advance directive?: No - Patient declined Copy of [place] in Chart?: No - copy requested    Assessment:    This is a routine wellness examination for this patient .   Vision/Hearing screen No results found.  Dietary issues and exercise activities discussed:  Current Exercise Habits: The patient does not participate in regular exercise at present (Simultaneous filing. User may not have seen previous data.)   Goals      Exercise 150 min/wk Moderate Activity     Planning on going to the gym.         Depression Screen    03/24/2022    4:00 PM 11/21/2021    1:09 PM 08/19/2021   10:14 AM 04/23/2020    3:25 PM  PHQ 2/9 Scores  PHQ - 2 Score 0 0 0 0  PHQ- 9 Score 2        Fall Risk    07/21/2022    3:28 PM  [place] in the past year? 0  Number falls in past yr: 0  Injury with  Fall? 0  Risk for fall due to : No Fall Risks  Follow up Falls evaluation completed    Cognitive Function:    02/05/2021    1:55 PM  MMSE - Mini Mental State Exam  Orientation to time 5  Orientation to Place 5  Registration 3  Attention/ Calculation 5  Recall 2  Language- name 2 objects 2  Language- repeat 1  Language- follow 3 step command 3  Language- read & follow direction 1  Write a sentence 1  Copy design 1  Total score 29        07/21/2022    3:27 PM  6CIT Screen  What Year? 0 points  What month? 0 points  What time? 0 points  Count back from 20 0 points  Months in reverse 0 points  Repeat phrase 0 points  Total Score 0 points    Patient Care Team: [name], MD as PCP - General Janie Morning, MD (Gastroenterology)     Plan:   Welcome to Medicare.    I have personally reviewed and noted the following in the patient's chart:   Medical and social history Use of alcohol, tobacco or illicit drugs  Current medications and supplements Functional ability and status Nutritional status Physical activity Advanced directives List of other physicians Hospitalizations, surgeries, and ER visits in previous 12 months Vitals Screenings to include cognitive, depression, and falls Referrals and appointments  In addition, I have reviewed and discussed with patient certain preventive protocols, quality metrics, and best practice recommendations. A written personalized care plan for preventive services as well as general preventive health recommendations were provided to patient.     Beatrice Lecher, MD 07/22/2022

## 2022-07-21 NOTE — Patient Instructions (Signed)
  Ms. Marmol , Thank you for taking time to come for your Medicare Wellness Visit. I appreciate your ongoing commitment to your health goals. Please review the following plan we discussed and let me know if I can assist you in the future.   These are the goals we discussed:  Goals      Exercise 150 min/wk Moderate Activity     Planning on going to the gym.          This is a list of the screening recommended for you and due dates:  Health Maintenance  Topic Date Due   COVID-19 Vaccine (4 - 2023-24 season) 03/25/2023*   Mammogram  03/28/2023   Medicare Annual Wellness Visit  07/22/2023   Pap Smear  04/23/2025   Colon Cancer Screening  08/07/2029   DTaP/Tdap/Td vaccine (6 - Td or Tdap) 04/23/2030   Pneumonia Vaccine  Completed   Flu Shot  Completed   DEXA scan (bone density measurement)  Completed   Hepatitis C Screening: USPSTF Recommendation to screen - Ages 1-79 yo.  Completed   HIV Screening  Completed   Zoster (Shingles) Vaccine  Completed   HPV Vaccine  Aged Out  *Topic was postponed. The date shown is not the original due date.

## 2022-09-26 DIAGNOSIS — M79661 Pain in right lower leg: Secondary | ICD-10-CM | POA: Diagnosis not present

## 2022-09-26 DIAGNOSIS — M79604 Pain in right leg: Secondary | ICD-10-CM | POA: Diagnosis not present

## 2022-09-26 DIAGNOSIS — I83893 Varicose veins of bilateral lower extremities with other complications: Secondary | ICD-10-CM | POA: Diagnosis not present

## 2022-09-26 DIAGNOSIS — M79662 Pain in left lower leg: Secondary | ICD-10-CM | POA: Diagnosis not present

## 2022-09-30 ENCOUNTER — Encounter: Payer: Self-pay | Admitting: Family Medicine

## 2022-09-30 DIAGNOSIS — I8390 Asymptomatic varicose veins of unspecified lower extremity: Secondary | ICD-10-CM | POA: Insufficient documentation

## 2022-10-17 DIAGNOSIS — I83891 Varicose veins of right lower extremities with other complications: Secondary | ICD-10-CM | POA: Diagnosis not present

## 2022-10-31 DIAGNOSIS — M7989 Other specified soft tissue disorders: Secondary | ICD-10-CM | POA: Diagnosis not present

## 2022-10-31 DIAGNOSIS — I83811 Varicose veins of right lower extremities with pain: Secondary | ICD-10-CM | POA: Diagnosis not present

## 2022-10-31 DIAGNOSIS — I83891 Varicose veins of right lower extremities with other complications: Secondary | ICD-10-CM | POA: Diagnosis not present

## 2022-11-07 DIAGNOSIS — I83892 Varicose veins of left lower extremities with other complications: Secondary | ICD-10-CM | POA: Diagnosis not present

## 2022-11-14 DIAGNOSIS — I83891 Varicose veins of right lower extremities with other complications: Secondary | ICD-10-CM | POA: Diagnosis not present

## 2022-11-21 DIAGNOSIS — I83892 Varicose veins of left lower extremities with other complications: Secondary | ICD-10-CM | POA: Diagnosis not present

## 2022-11-28 DIAGNOSIS — I83892 Varicose veins of left lower extremities with other complications: Secondary | ICD-10-CM | POA: Diagnosis not present

## 2022-12-12 DIAGNOSIS — I83892 Varicose veins of left lower extremities with other complications: Secondary | ICD-10-CM | POA: Diagnosis not present

## 2022-12-26 DIAGNOSIS — I83892 Varicose veins of left lower extremities with other complications: Secondary | ICD-10-CM | POA: Diagnosis not present

## 2022-12-26 DIAGNOSIS — I87392 Chronic venous hypertension (idiopathic) with other complications of left lower extremity: Secondary | ICD-10-CM | POA: Diagnosis not present

## 2023-02-20 ENCOUNTER — Encounter: Payer: Self-pay | Admitting: Sports Medicine

## 2023-02-20 ENCOUNTER — Ambulatory Visit (INDEPENDENT_AMBULATORY_CARE_PROVIDER_SITE_OTHER): Payer: Medicare HMO | Admitting: Sports Medicine

## 2023-02-20 ENCOUNTER — Other Ambulatory Visit: Payer: Self-pay | Admitting: Family Medicine

## 2023-02-20 ENCOUNTER — Ambulatory Visit (INDEPENDENT_AMBULATORY_CARE_PROVIDER_SITE_OTHER): Payer: Medicare HMO

## 2023-02-20 DIAGNOSIS — M79675 Pain in left toe(s): Secondary | ICD-10-CM

## 2023-02-20 DIAGNOSIS — Z1231 Encounter for screening mammogram for malignant neoplasm of breast: Secondary | ICD-10-CM

## 2023-02-20 DIAGNOSIS — S92522A Displaced fracture of medial phalanx of left lesser toe(s), initial encounter for closed fracture: Secondary | ICD-10-CM | POA: Diagnosis not present

## 2023-02-20 NOTE — Progress Notes (Signed)
    Procedures performed today:    None.  Independent interpretation of notes and tests performed by another provider:   None.  Brief History, Exam, Impression, and Recommendations:    Toe pain, left Stubbed left second toe about a week or 2 ago.  Swelling, bruising and pain at the middle phalanx. Adding x-rays, continue postop shoe, buddy taping, she tells me ibuprofen is sufficient for pain. I do have a trip coming up to [place], they will come back to see me in a month if needed.    ____________________________________________ Ihor Austin. Benjamin Stain, M.D., ABFM., CAQSM., AME. Primary Care and Sports Medicine [facility]  Adjunct Professor of Family Medicine  [employer]  Restaurant manager, fast food

## 2023-02-20 NOTE — Assessment & Plan Note (Signed)
Stubbed left second toe about a week or 2 ago.  Swelling, bruising and pain at the middle phalanx. Adding x-rays, continue postop shoe, buddy taping, she tells me ibuprofen is sufficient for pain. I do have a trip coming up to [place], they will come back to see me in a month if needed.

## 2023-03-07 DIAGNOSIS — M79661 Pain in right lower leg: Secondary | ICD-10-CM | POA: Diagnosis not present

## 2023-03-07 DIAGNOSIS — R9431 Abnormal electrocardiogram [ECG] [EKG]: Secondary | ICD-10-CM | POA: Diagnosis not present

## 2023-03-07 DIAGNOSIS — M542 Cervicalgia: Secondary | ICD-10-CM | POA: Diagnosis not present

## 2023-03-07 DIAGNOSIS — Z79899 Other long term (current) drug therapy: Secondary | ICD-10-CM | POA: Diagnosis not present

## 2023-03-07 DIAGNOSIS — M25511 Pain in right shoulder: Secondary | ICD-10-CM | POA: Diagnosis not present

## 2023-03-07 DIAGNOSIS — M79604 Pain in right leg: Secondary | ICD-10-CM | POA: Diagnosis not present

## 2023-03-07 DIAGNOSIS — K5190 Ulcerative colitis, unspecified, without complications: Secondary | ICD-10-CM | POA: Diagnosis not present

## 2023-03-07 DIAGNOSIS — M79605 Pain in left leg: Secondary | ICD-10-CM | POA: Diagnosis not present

## 2023-03-07 DIAGNOSIS — R42 Dizziness and giddiness: Secondary | ICD-10-CM | POA: Diagnosis not present

## 2023-03-07 DIAGNOSIS — M79662 Pain in left lower leg: Secondary | ICD-10-CM | POA: Diagnosis not present

## 2023-03-10 DIAGNOSIS — H2513 Age-related nuclear cataract, bilateral: Secondary | ICD-10-CM | POA: Diagnosis not present

## 2023-03-10 DIAGNOSIS — H35372 Puckering of macula, left eye: Secondary | ICD-10-CM | POA: Diagnosis not present

## 2023-03-10 DIAGNOSIS — H524 Presbyopia: Secondary | ICD-10-CM | POA: Diagnosis not present

## 2023-03-11 ENCOUNTER — Telehealth: Payer: Self-pay | Admitting: Family Medicine

## 2023-03-11 DIAGNOSIS — R42 Dizziness and giddiness: Secondary | ICD-10-CM

## 2023-03-11 NOTE — Telephone Encounter (Signed)
Call patient and let her know that we did go and place referral for neurology for her dizziness.  Orders Placed This Encounter  Procedures   Ambulatory referral to Neurology    Referral Priority:   Routine    Referral Type:   Consultation    Referral Reason:   Specialty Services Required    Requested Specialty:   Neurology    Number of Visits Requested:   1

## 2023-03-11 NOTE — Telephone Encounter (Signed)
Attempted call for patient. Phone rang without answer. Could not leave a voice mail message.

## 2023-03-12 NOTE — Telephone Encounter (Signed)
Attempted call to patient. Again phone rang without answer.

## 2023-03-13 ENCOUNTER — Encounter: Payer: Self-pay | Admitting: Family Medicine

## 2023-03-13 ENCOUNTER — Ambulatory Visit (INDEPENDENT_AMBULATORY_CARE_PROVIDER_SITE_OTHER): Payer: Medicare HMO | Admitting: Family Medicine

## 2023-03-13 VITALS — BP 118/74 | HR 72

## 2023-03-13 DIAGNOSIS — H8112 Benign paroxysmal vertigo, left ear: Secondary | ICD-10-CM

## 2023-03-13 NOTE — Telephone Encounter (Signed)
Letter placed in mail to patient.  

## 2023-03-13 NOTE — Progress Notes (Addendum)
Acute Office Visit  Subjective:     Patient ID: Diane Chavez, female    DOB: Jan 12, 1956, 67 y.o.   MRN: [id number]  Chief Complaint  Patient presents with   Dizziness    HPI Patient is in today for   Went to the ED on 03/07/03 bc she was worried about blood clots after recentl travel to [place].   After she got back from her trip she was lying in bed and rolled to her left side and got sudden onset of quick movement.  It really just lasted a few seconds it has been present ever since particularly when she lays on her left side so in bed she has been having to sleep on her right and it is a little bit better she says when she can gets up and moving it seems to be a little better.  She does have meclizine and has been taking that she says she always has a little bit of a baseline feeling kind of like if she moves too quickly things will move but is never been quite to this intensity.  CT cervical spine without contrast in March 19, 2019 showing no acute intracranial abnormalities.  But she did have moderate C5-C6 disc space narrowing with small marginal osteophytes and endplate sclerosis.  She had cervical spine plain films March 2023 showing encroachment of neural foramina at C4-5 and C5-6 levels.  And severe on the left side at C5-C6 level.  He did see sports medicine back in March 2023 as well and recommended steroid course, home PT and if not improving to please return.     ROS      Objective:    BP 118/74   Pulse 72   LMP 06/11/2000   SpO2 99%    Physical Exam Vitals and nursing note reviewed.  Constitutional:      Appearance: Normal appearance.  HENT:     Head: Normocephalic and atraumatic.     Right Ear: Tympanic membrane, ear canal and external ear normal. There is no impacted cerumen.     Left Ear: Tympanic membrane, ear canal and external ear normal. There is no impacted cerumen.     Nose: Nose normal.     Mouth/Throat:     Pharynx: Oropharynx is clear.   Eyes:     Conjunctiva/sclera: Conjunctivae normal.  Cardiovascular:     Rate and Rhythm: Normal rate and regular rhythm.  Pulmonary:     Effort: Pulmonary effort is normal.     Breath sounds: Normal breath sounds.  Musculoskeletal:     Cervical back: Neck supple. No tenderness.  Lymphadenopathy:     Cervical: No cervical adenopathy.  Skin:    General: Skin is warm and dry.  Neurological:     Mental Status: She is alert and oriented to person, place, and time.     Comments: Positive Dix-Hallpike maneuver to the left.  Psychiatric:        Mood and Affect: Mood normal.     No results found for any visits on 03/13/23.      Assessment & Plan:   Problem List Items Addressed This Visit   None Visit Diagnoses     BPPV (benign paroxysmal positional vertigo), left    -  Primary   Relevant Orders   Ambulatory referral to Physical Therapy      Symptoms most consistent with BPPV, we discussed diagnosis.  Given handout to do exercises on her own at home and reviewed those  together.  She does still have some meclizine given to her by urgent care but she can call back for refill if needed we can go ahead and also place a referral for vestibular rehab.  If not improving please let us know.  Orthostatics were normal today which is reassuring.  No orders of the defined types were placed in this encounter.   No follow-ups on file.  Nani Gasser, MD

## 2023-03-13 NOTE — Telephone Encounter (Signed)
Attempted  call to patient again . Again rang without answer.

## 2023-04-01 ENCOUNTER — Ambulatory Visit: Payer: Medicare HMO

## 2023-04-01 DIAGNOSIS — Z1231 Encounter for screening mammogram for malignant neoplasm of breast: Secondary | ICD-10-CM | POA: Diagnosis not present

## 2023-04-03 NOTE — Progress Notes (Signed)
Please call patient. Normal mammogram.  Repeat in 1 year.  

## 2023-04-08 ENCOUNTER — Ambulatory Visit (INDEPENDENT_AMBULATORY_CARE_PROVIDER_SITE_OTHER): Payer: Medicare HMO | Admitting: Family Medicine

## 2023-04-08 ENCOUNTER — Encounter: Payer: Self-pay | Admitting: Family Medicine

## 2023-04-08 VITALS — BP 123/56 | HR 59 | Ht 69.0 in | Wt 126.0 lb

## 2023-04-08 DIAGNOSIS — K5090 Crohn's disease, unspecified, without complications: Secondary | ICD-10-CM

## 2023-04-08 DIAGNOSIS — Z0000 Encounter for general adult medical examination without abnormal findings: Secondary | ICD-10-CM

## 2023-04-08 DIAGNOSIS — M810 Age-related osteoporosis without current pathological fracture: Secondary | ICD-10-CM

## 2023-04-08 DIAGNOSIS — Z13220 Encounter for screening for lipoid disorders: Secondary | ICD-10-CM

## 2023-04-08 DIAGNOSIS — Z131 Encounter for screening for diabetes mellitus: Secondary | ICD-10-CM

## 2023-04-08 DIAGNOSIS — Z23 Encounter for immunization: Secondary | ICD-10-CM

## 2023-04-08 NOTE — Progress Notes (Signed)
Complete physical exam  Patient: Diane Chavez   DOB: Jan 25, 1956   67 y.o. Female  MRN: [id number]  Subjective:    Chief Complaint  Patient presents with   Annual Exam    Diane Chavez is a 67 y.o. female who presents today for a complete physical exam. She reports consuming a general diet.  Walking and goihg to gym 3 days per week.   She generally feels well. She reports sleeping fairly well. She does not have additional problems to discuss today.    Most recent fall risk assessment:    07/21/2022    3:28 PM  Fall Risk   Falls in the past year? 0  Number falls in past yr: 0  Injury with Fall? 0  Risk for fall due to : No Fall Risks  Follow up Falls evaluation completed     Most recent depression screenings:    04/08/2023    9:58 AM 03/24/2022    4:00 PM  PHQ 2/9 Scores  PHQ - 2 Score 0 0  PHQ- 9 Score  2        Patient Care Team: Agapito Games, MD as PCP - General Marnette Burgess, MD (Gastroenterology)   Outpatient Medications Prior to Visit  Medication Sig   ALPRAZolam (XANAX) 0.25 MG tablet Take 0.25 mg by mouth 3 (three) times daily as needed.   Calcium Carbonate-Vit D-Min (CALCIUM 1200 PO) Take by mouth.     cholecalciferol (VITAMIN D) 1000 units tablet Take 1,000 Units by mouth daily.   dicyclomine (BENTYL) 10 MG capsule    LUTEIN PO Take 1 tablet by mouth daily.   meloxicam (MOBIC) 15 MG tablet One tab PO qAM with a meal for 2 weeks, then daily prn pain.   VITAMIN K PO Take by mouth.   No facility-administered medications prior to visit.    ROS        Objective:     BP (!) 123/56   Pulse (!) 59   Ht 5\' 9"  (1.753 m)   Wt 126 lb (57.2 kg)   LMP 06/11/2000   SpO2 100%   BMI 18.61 kg/m    Physical Exam Constitutional:      Appearance: Normal appearance.  HENT:     Head: Normocephalic and atraumatic.     Right Ear: Tympanic membrane, ear canal and external ear normal.     Left Ear: Tympanic membrane, ear canal and  external ear normal.     Nose: Nose normal.     Mouth/Throat:     Pharynx: Oropharynx is clear.  Eyes:     Extraocular Movements: Extraocular movements intact.     Conjunctiva/sclera: Conjunctivae normal.     Pupils: Pupils are equal, round, and reactive to light.  Neck:     Thyroid: No thyromegaly.  Cardiovascular:     Rate and Rhythm: Normal rate and regular rhythm.  Pulmonary:     Effort: Pulmonary effort is normal.     Breath sounds: Normal breath sounds.  Abdominal:     General: Bowel sounds are normal.     Palpations: Abdomen is soft.     Tenderness: There is no abdominal tenderness.  Musculoskeletal:        General: No swelling.     Cervical back: Neck supple.  Skin:    General: Skin is warm and dry.  Neurological:     Mental Status: She is oriented to person, place, and time.  Psychiatric:  Mood and Affect: Mood normal.        Behavior: Behavior normal.      No results found for any visits on 04/08/23.     Assessment & Plan:    Routine Health Maintenance and Physical Exam  Immunization History  Administered Date(s) Administered   Fluad Quad(high Dose 65+) 03/24/2022   Fluad Trivalent(High Dose 65+) 04/08/2023   Influenza,inj,Quad PF,6+ Mos 02/14/2017, 03/23/2019, 03/28/2020   Influenza,inj,quad, With Preservative 04/06/2018   Influenza-Unspecified 03/30/2013, 02/25/2014, 05/14/2015, 02/26/2016, 03/16/2021   PFIZER(Purple Top)SARS-COV-2 Vaccination 09/22/2019, 10/13/2019, 02/01/2021   PNEUMOCOCCAL CONJUGATE-20 02/05/2021   RSV,unspecified 06/04/2022   Td 06/16/2000, 10/31/2009   Td (Adult), 2 Lf Tetanus Toxid, Preservative Free 06/16/2000, 10/31/2009   Tdap 04/23/2020   Zoster Recombinant(Shingrix) 04/23/2020, 02/01/2021    Health Maintenance  Topic Date Due   COVID-19 Vaccine (4 - 2023-24 season) 02/15/2023   Medicare Annual Wellness (AWV)  07/22/2023   MAMMOGRAM  03/31/2024   Colonoscopy  08/07/2029   DTaP/Tdap/Td (6 - Td or Tdap) 04/23/2030    Pneumonia Vaccine 73+ Years old  Completed   INFLUENZA VACCINE  Completed   DEXA SCAN  Completed   Hepatitis C Screening  Completed   Zoster Vaccines- Shingrix  Completed   HPV VACCINES  Aged Out    Discussed health benefits of physical activity, and encouraged her to engage in regular exercise appropriate for her age and condition.  Problem List Items Addressed This Visit       Musculoskeletal and Integument   Osteoporosis   Relevant Orders   CBC   CMP14+EGFR   Lipid Panel With LDL/HDL Ratio   Vitamin D (25 hydroxy)     Other   Regional enteritis (HCC)   Relevant Orders   CBC   CMP14+EGFR   Lipid Panel With LDL/HDL Ratio   Vitamin D (25 hydroxy)   Other Visit Diagnoses     Encounter for immunization    -  Primary   Relevant Orders   Flu Vaccine Trivalent High Dose (Fluad) (Completed)   Wellness examination       Lipid screening       Relevant Orders   CBC   CMP14+EGFR   Lipid Panel With LDL/HDL Ratio   Vitamin D (25 hydroxy)   Diabetes mellitus screening       Relevant Orders   CBC   CMP14+EGFR   Lipid Panel With LDL/HDL Ratio   Vitamin D (25 hydroxy)       Keep up a regular exercise program and make sure you are eating a healthy diet Try to eat 4 servings of dairy a day, or if you are lactose intolerant take a calcium with vitamin D daily.  Your vaccines are up to date.   Return in about 1 year (around 04/07/2024) for Wellness Exam.     Nani Gasser, MD

## 2023-04-09 LAB — CMP14+EGFR
ALT: 9 [IU]/L (ref 0–32)
AST: 19 [IU]/L (ref 0–40)
Albumin: 4.7 g/dL (ref 3.9–4.9)
Alkaline Phosphatase: 85 [IU]/L (ref 44–121)
BUN/Creatinine Ratio: 15 (ref 12–28)
BUN: 10 mg/dL (ref 8–27)
Bilirubin Total: 0.5 mg/dL (ref 0.0–1.2)
CO2: 25 mmol/L (ref 20–29)
Calcium: 9.9 mg/dL (ref 8.7–10.3)
Chloride: 99 mmol/L (ref 96–106)
Creatinine, Ser: 0.68 mg/dL (ref 0.57–1.00)
Globulin, Total: 2.2 g/dL (ref 1.5–4.5)
Glucose: 79 mg/dL (ref 70–99)
Potassium: 4.2 mmol/L (ref 3.5–5.2)
Sodium: 139 mmol/L (ref 134–144)
Total Protein: 6.9 g/dL (ref 6.0–8.5)
eGFR: 96 mL/min/{1.73_m2} (ref >59)

## 2023-04-09 LAB — CBC
Hematocrit: 40.7 % (ref 34.0–46.6)
Hemoglobin: 13.2 g/dL (ref 11.1–15.9)
MCH: 30.6 pg (ref 26.6–33.0)
MCHC: 32.4 g/dL (ref 31.5–35.7)
MCV: 94 fL (ref 79–97)
Platelets: 211 10*3/uL (ref 150–450)
RBC: 4.32 x10E6/uL (ref 3.77–5.28)
RDW: 12.4 % (ref 11.7–15.4)
WBC: 5.2 10*3/uL (ref 3.4–10.8)

## 2023-04-09 LAB — LIPID PANEL WITH LDL/HDL RATIO
Cholesterol, Total: 245 mg/dL — ABNORMAL HIGH (ref 100–199)
HDL: 78 mg/dL (ref >39)
LDL Chol Calc (NIH): 153 mg/dL — ABNORMAL HIGH (ref 0–99)
LDL/HDL Ratio: 2 ratio (ref 0.0–3.2)
Triglycerides: 80 mg/dL (ref 0–149)
VLDL Cholesterol Cal: 14 mg/dL (ref 5–40)

## 2023-04-09 LAB — VITAMIN D 25 HYDROXY (VIT D DEFICIENCY, FRACTURES): Vit D, 25-Hydroxy: 70.4 ng/mL (ref 30.0–100.0)

## 2023-04-09 NOTE — Progress Notes (Signed)
Hi Diane Chavez,  Your cholesterol is high.  All other labs lok great!!   The 10-year ASCVD risk score (Arnett DK, et al., 2019) is: 5.6%   Values used to calculate the score:     Age: 67 years     Sex: Female     Is Non-Hispanic African American: No     Diabetic: No     Tobacco smoker: No     Systolic Blood Pressure: 123 mmHg     Is BP treated: No     HDL Cholesterol: 78 mg/dL     Total Cholesterol: 245 mg/dL

## 2023-04-13 ENCOUNTER — Ambulatory Visit
Admission: RE | Admit: 2023-04-13 | Discharge: 2023-04-13 | Disposition: A | Discharge status: Home / Self Care | Payer: Medicare HMO | Source: Ambulatory Visit | Attending: Family Medicine | Admitting: Family Medicine

## 2023-04-13 ENCOUNTER — Telehealth: Payer: Self-pay | Admitting: *Deleted

## 2023-04-13 ENCOUNTER — Ambulatory Visit: Payer: Medicare HMO

## 2023-04-13 ENCOUNTER — Encounter: Payer: Self-pay | Admitting: Family Medicine

## 2023-04-13 ENCOUNTER — Other Ambulatory Visit: Payer: Self-pay

## 2023-04-13 VITALS — BP 126/59 | HR 76 | Temp 98.1°F | Resp 18

## 2023-04-13 DIAGNOSIS — M25562 Pain in left knee: Secondary | ICD-10-CM

## 2023-04-13 DIAGNOSIS — S8992XA Unspecified injury of left lower leg, initial encounter: Secondary | ICD-10-CM

## 2023-04-13 DIAGNOSIS — M25462 Effusion, left knee: Secondary | ICD-10-CM | POA: Diagnosis not present

## 2023-04-13 MED ORDER — CELECOXIB 200 MG PO CAPS
200.0000 mg | ORAL_CAPSULE | Freq: Every day | ORAL | 0 refills | Status: DC
Start: 2023-04-13 — End: 2023-04-28

## 2023-04-13 NOTE — ED Triage Notes (Signed)
Pt has knee pain on left after her daughters dog knocked her down. Pt has pain and swelling to Lt knee.

## 2023-04-13 NOTE — Telephone Encounter (Signed)
Let's get her in with Dr. Karie Schwalbe this week if possible

## 2023-04-13 NOTE — Telephone Encounter (Signed)
TC call to Pt to reports x-ray results.

## 2023-04-13 NOTE — Discharge Instructions (Addendum)
Advised patient to take medication as directed with food to completion.  Encouraged to increase daily water intake to 64 ounces per day while taking this medication.  Advised patient to RICE affected area of left knee for 30 minutes 3 times daily for the next 3 days.  Advised if symptoms worsen and/or unresolved after 7 to 10 days please follow-up with [facility] orthopedics for further evaluation.  Advised if symptoms worsen and/or unresolved please follow-up with PCP or here for further evaluation.

## 2023-04-13 NOTE — ED Provider Notes (Signed)
[facility]    CSN: [id number] Arrival date & time: 04/13/23  1034      History   Chief Complaint Chief Complaint  Patient presents with   Fall    Knee is swollen and very sore. - Entered by patient   Knee Injury    HPI JOZALYN WALKENHORST is a 67 y.o. female.   HPI 67 year old female presents with left knee pain after her daughter's dog knocked her down.  Patient reports pain and swelling to left knee.  PMH significant for anxiety, Crohn's disease, and UC.  Patient is accompanied by her husband today.  Past Medical History:  Diagnosis Date   Anxiety    Crohn disease (HCC)    Dr. 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   DDD (degenerative disc disease), cervical 08/22/2021   Osteoarthritis of right acromioclavicular joint 07/26/2021   Memory change 02/05/2021   Lumbar degenerative disc disease 10/04/2018   Acute shoulder bursitis, left 05/26/2018   Left knee swelling with Baker's cyst 09/30/2017   Status post cardiac surgery 09/05/2013   Epiretinal membrane 12/10/2012   Osteoporosis 10/10/2010   BENIGN POSITIONAL VERTIGO 04/05/2010   Regional enteritis (HCC) 07/30/2009   Headache 07/30/2009   Asymptomatic postmenopausal status 04/25/2009   Anxiety state 09/20/2007   Insomnia 09/20/2007    Past Surgical History:  Procedure Laterality Date   CARDIAC SURGERY     as a child   EYE SURGERY  10/30/2009   repair of detached retina   TONSILLECTOMY  06/17/1963   TUBAL LIGATION  1986    OB History   No obstetric history on file.      Home Medications    Prior to Admission medications   Medication Sig Start Date End Date Taking? Authorizing Provider  Calcium Carbonate-Vit D-Min (CALCIUM 1200 PO) Take by mouth.     Yes [provider]  celecoxib (CELEBREX) 200 MG capsule Take 1 capsule  (200 mg total) by mouth daily for 15 days. 04/13/23 04/28/23 Yes Trevor Iha, FNP  cholecalciferol (VITAMIN D) 1000 units tablet Take 1,000 Units by mouth daily.   Yes [provider]  dicyclomine (BENTYL) 10 MG capsule  12/18/21  Yes [provider]  LUTEIN PO Take 1 tablet by mouth daily.   Yes [provider]    Family History Family History  Problem Relation Age of Onset   Alzheimer's disease Mother     Social History Social History   Tobacco Use   Smoking status: Former    Current packs/day: 0.00    Types: Cigarettes    Quit date: 06/16/1973    Years since quitting: 49.8   Smokeless tobacco: Never   Tobacco comments:    I didn't inhale, just trying to look cool for a little while.  Vaping Use   Vaping status: Never Used  Substance Use Topics   Alcohol use: Yes    Alcohol/week: 7.0 standard drinks of alcohol    Types: 7 Glasses of wine per week    Comment: one beer or one glass of wine most evenings   Drug use: No     Allergies   Patient has no known allergies.   Review of Systems Review of Systems  Musculoskeletal:        Left knee  pain secondary to left knee injury after her daughter's dog knocked her down  Hematological:  Bruises/bleeds easily.     Physical Exam Triage Vital Signs ED Triage Vitals  Encounter Vitals Group     BP 04/13/23 1117 (!) 126/59     Systolic BP Percentile --      Diastolic BP Percentile --      Pulse Rate 04/13/23 1117 76     Resp 04/13/23 1117 18     Temp 04/13/23 1117 98.1 F (36.7 C)     Temp src --      SpO2 04/13/23 1117 98 %     Weight --      Height --      Head Circumference --      Peak Flow --      Pain Score 04/13/23 1114 10     Pain Loc --      Pain Education --      Exclude from Growth Chart --    No data found.  Updated Vital Signs BP (!) 126/59   Pulse 76   Temp 98.1 F (36.7 C)   Resp 18   LMP 06/11/2000   SpO2 98%      Physical Exam Vitals and nursing note  reviewed.  Constitutional:      Appearance: Normal appearance. She is normal weight.  HENT:     Head: Normocephalic and atraumatic.     Mouth/Throat:     Mouth: Mucous membranes are moist.     Pharynx: Oropharynx is clear.  Eyes:     Extraocular Movements: Extraocular movements intact.     Conjunctiva/sclera: Conjunctivae normal.     Pupils: Pupils are equal, round, and reactive to light.  Cardiovascular:     Rate and Rhythm: Normal rate and regular rhythm.     Pulses: Normal pulses.     Heart sounds: Normal heart sounds.  Pulmonary:     Effort: Pulmonary effort is normal.     Breath sounds: Normal breath sounds. No wheezing, rhonchi or rales.  Musculoskeletal:        General: Normal range of motion.     Cervical back: Normal range of motion and neck supple.     Comments: Left knee (anterior medial aspect): TTP with moderate soft tissue swelling noted, exam limited due to pain today patient ambulating with 4 point rolling walker.  Skin:    General: Skin is warm and dry.  Neurological:     General: No focal deficit present.     Mental Status: She is alert and oriented to person, place, and time.  Psychiatric:        Mood and Affect: Mood normal.        Behavior: Behavior normal.      UC Treatments / Results  Labs (all labs ordered are listed, but only abnormal results are displayed) Labs Reviewed - No data to display  EKG   Radiology DG Knee Complete 4 Views Left  Result Date: 04/13/2023 CLINICAL DATA:  Status post fall with left knee pain. Pain anteriorly. EXAM: LEFT KNEE - COMPLETE 4+ VIEW COMPARISON:  Radiograph 08/20/2021 FINDINGS: No fracture or dislocation. There is a knee joint effusion but no lipohemarthrosis. The bones are subjectively under mineralized. Chondrocalcinosis, no erosions. IMPRESSION: 1. Knee joint effusion without fracture or dislocation. As clinically indicated, consider MRI for further assessment. 2. Chondrocalcinosis. Electronically Signed   By:  Narda Rutherford M.D.   On: 04/13/2023 14:40    Procedures Procedures (including  critical care time)  Medications Ordered in UC Medications - No data to display  Initial Impression / Assessment and Plan / UC Course  I have reviewed the triage vital signs and the nursing notes.  Pertinent labs & imaging results that were available during my care of the patient were reviewed by me and considered in my medical decision making (see chart for details).     MDM: 1.  Acute pain of left knee-left knee x-ray results revealed above, Rx'd Celebrex 200 mg capsule: Take 1 capsule by mouth daily x 15 days; 2.  Injury of left knee, initial encounter-left knee x-ray results revealed above.  Patient advised. Advised patient to take medication as directed with food to completion.  Encouraged to increase daily water intake to 64 ounces per day while taking this medication.  Advised patient to RICE affected area of left knee for 30 minutes 3 times daily for the next 3 days.  Advised if symptoms worsen and/or unresolved after 7 to 10 days please follow-up with [facility] orthopedics for further evaluation.  Advised if symptoms worsen and/or unresolved please follow-up with PCP or here for further evaluation.  Final Clinical Impressions(s) / UC Diagnoses   Final diagnoses:  Acute pain of left knee  Injury of left knee, initial encounter     Discharge Instructions      Advised patient to take medication as directed with food to completion.  Encouraged to increase daily water intake to 64 ounces per day while taking this medication.  Advised patient to RICE affected area of left knee for 30 minutes 3 times daily for the next 3 days.  Advised if symptoms worsen and/or unresolved after 7 to 10 days please follow-up with [facility] orthopedics for further evaluation.  Advised if symptoms worsen and/or unresolved please follow-up with PCP or here for further evaluation.     ED Prescriptions     Medication  Sig Dispense Auth. Provider   celecoxib (CELEBREX) 200 MG capsule Take 1 capsule (200 mg total) by mouth daily for 15 days. 15 capsule Trevor Iha, FNP      PDMP not reviewed this encounter.   Trevor Iha, FNP 04/13/23 1502

## 2023-04-14 NOTE — Telephone Encounter (Signed)
Patient scheduled.

## 2023-04-15 ENCOUNTER — Encounter: Payer: Self-pay | Admitting: Sports Medicine

## 2023-04-15 ENCOUNTER — Ambulatory Visit (INDEPENDENT_AMBULATORY_CARE_PROVIDER_SITE_OTHER): Payer: Medicare HMO | Admitting: Sports Medicine

## 2023-04-15 ENCOUNTER — Other Ambulatory Visit (INDEPENDENT_AMBULATORY_CARE_PROVIDER_SITE_OTHER): Payer: Medicare HMO

## 2023-04-15 DIAGNOSIS — M25462 Effusion, left knee: Secondary | ICD-10-CM

## 2023-04-15 DIAGNOSIS — S82142D Displaced bicondylar fracture of left tibia, subsequent encounter for closed fracture with routine healing: Secondary | ICD-10-CM | POA: Diagnosis not present

## 2023-04-15 NOTE — Assessment & Plan Note (Addendum)
Recent fall, immediate swelling and pain, seen in urgent care, x-rays were officially read as negative by radiology though I do see a potential tibial plateau fracture. There is a moderate effusion, tense. She is unable to flex past about 45 degrees and has a 10 degrees of extension lag. LCL and MCL are stable but I do feel some laxity of the ACL (for insurance coverage purposes positive anterior drawer sign and positive Lachman test). We did an aspiration with about 50 mL of hemarthrosis. Continue Celebrex, adding an MRI. Knee immobilizer. Continue non to partial weightbearing with walker. Suspect tibial plateau fracture versus ACL disruption versus meniscal tear. Patient does have mechanical symptoms. Will follow back up virtually for MRI results.

## 2023-04-15 NOTE — Progress Notes (Signed)
    Procedures performed today:    Procedure: Real-time Ultrasound Guided aspiration of left knee Device: Samsung [id number]  Verbal informed consent obtained.  Time-out conducted.  Noted no overlying erythema, induration, or other signs of local infection.  Skin prepped in a sterile fashion.  Local anesthesia: Topical Ethyl chloride.  With sterile technique and under real time ultrasound guidance: Noted effusion, I aspirated 15 mL of frank blood from the suprapatellar recess. Completed without difficulty  Advised to call if fevers/chills, erythema, induration, drainage, or persistent bleeding.  Images permanently stored and available for review in PACS.  Impression: Technically successful ultrasound guided arthrocentesis.  Independent interpretation of notes and tests performed by another provider:   X-rays personally reviewed, I do see a potential fracture at the tibial plateau on the oblique view  Brief History, Exam, Impression, and Recommendations:    Left knee swelling with Baker's cyst Recent fall, immediate swelling and pain, seen in urgent care, x-rays were officially read as negative by radiology though I do see a potential tibial plateau fracture. There is a moderate effusion, tense. She is unable to flex past about 45 degrees and has a 10 degrees of extension lag. LCL and MCL are stable but I do feel some laxity of the ACL (for insurance coverage purposes positive anterior drawer sign and positive Lachman test). We did an aspiration with about 50 mL of hemarthrosis. Continue Celebrex, adding an MRI. Knee immobilizer. Continue non to partial weightbearing with walker. Suspect tibial plateau fracture versus ACL disruption versus meniscal tear. Patient does have mechanical symptoms. Will follow back up virtually for MRI results.    ____________________________________________ Ihor Austin. Benjamin Stain, M.D., ABFM., CAQSM., AME. Primary Care and Sports Medicine [place]  [facility]  Adjunct Professor of Family Medicine  [place] [employer]  Restaurant manager, fast food

## 2023-04-18 ENCOUNTER — Ambulatory Visit: Payer: Medicare HMO

## 2023-04-18 DIAGNOSIS — S83242A Other tear of medial meniscus, current injury, left knee, initial encounter: Secondary | ICD-10-CM | POA: Diagnosis not present

## 2023-04-18 DIAGNOSIS — M25462 Effusion, left knee: Secondary | ICD-10-CM

## 2023-04-20 ENCOUNTER — Encounter: Payer: Self-pay | Admitting: Sports Medicine

## 2023-04-21 ENCOUNTER — Encounter: Payer: Self-pay | Admitting: Sports Medicine

## 2023-04-24 ENCOUNTER — Ambulatory Visit: Payer: Medicare HMO | Admitting: Sports Medicine

## 2023-04-27 ENCOUNTER — Encounter: Payer: Self-pay | Admitting: Sports Medicine

## 2023-04-27 ENCOUNTER — Ambulatory Visit (INDEPENDENT_AMBULATORY_CARE_PROVIDER_SITE_OTHER): Payer: Medicare HMO | Admitting: Sports Medicine

## 2023-04-27 DIAGNOSIS — S82142D Displaced bicondylar fracture of left tibia, subsequent encounter for closed fracture with routine healing: Secondary | ICD-10-CM

## 2023-04-27 MED ORDER — TRAMADOL HCL 50 MG PO TABS: 50.0000 mg | ORAL_TABLET | Freq: Three times a day (TID) | ORAL | 0 refills | Status: DC | PRN

## 2023-04-27 NOTE — Progress Notes (Signed)
    Procedures performed today:    None.  Independent interpretation of notes and tests performed by another provider:   Visible tibial plateau fracture laterally, also medial and lateral meniscal tearing with extrusion of the lateral meniscus.  Brief History, Exam, Impression, and Recommendations:    Tibial plateau fracture, left This is a very pleasant 67 year old female, she had a fall at the end of October, was seen in urgent care, x-rays were read as negative though I did see a potential tibial plateau fracture on the x-rays. She had a moderate effusion, we aspirated 50 mL of hemarthrosis. We obtained an MRI that did confirm nondisplaced tibial plateau fracture as well as medial and lateral meniscal tearing She has been nonweightbearing and in a knee immobilizer for about 2 weeks now. She still has some tenderness at the lateral tibia. We went over the MRI images today. She will continue nonweightbearing, she will continue the knee immobilizer with stiffness for 2 weeks and then discontinue the stiffness but continue the immobilizer. Adding tramadol for pain, she does have some difficulty sleeping. I would also would like her to start some physical therapy in about 2 weeks, she will remain nonweightbearing and do 2 weeks of passive motion and then 2 weeks of active motion. Return to see me in 4 weeks and we can determine her weightbearing status at that point.    ____________________________________________ Ihor Austin. Benjamin Stain, M.D., ABFM., CAQSM., AME. Primary Care and Sports Medicine [place] [facility]  Adjunct Professor of Family Medicine  [place] [employer] [facility]  Restaurant manager, fast food

## 2023-04-27 NOTE — Assessment & Plan Note (Signed)
This is a very pleasant 67 year old female, she had a fall at the end of October, was seen in urgent care, x-rays were read as negative though I did see a potential tibial plateau fracture on the x-rays. She had a moderate effusion, we aspirated 50 mL of hemarthrosis. We obtained an MRI that did confirm nondisplaced tibial plateau fracture as well as medial and lateral meniscal tearing She has been nonweightbearing and in a knee immobilizer for about 2 weeks now. She still has some tenderness at the lateral tibia. We went over the MRI images today. She will continue nonweightbearing, she will continue the knee immobilizer with stiffness for 2 weeks and then discontinue the stiffness but continue the immobilizer. Adding tramadol for pain, she does have some difficulty sleeping. I would also would like her to start some physical therapy in about 2 weeks, she will remain nonweightbearing and do 2 weeks of passive motion and then 2 weeks of active motion. Return to see me in 4 weeks and we can determine her weightbearing status at that point.

## 2023-05-11 NOTE — Therapy (Signed)
OUTPATIENT PHYSICAL THERAPY LOWER EXTREMITY EVALUATION   Patient Name: Diane Chavez MRN: [id number] DOB:04/27/1956, 67 y.o., female Today's Date: 05/12/2023  END OF SESSION:  PT End of Session - 05/12/23 0929     Visit Number 1    Number of Visits 17    Date for PT Re-Evaluation 07/11/23    Authorization Type [employer]-requesting auth    PT Start Time 0930    PT Stop Time 1014    PT Time Calculation (min) 44 min    Activity Tolerance Patient tolerated treatment well    Behavior During Therapy [facility] for tasks assessed/performed             Past Medical History:  Diagnosis Date   Anxiety    Crohn disease (HCC)    Dr. Jacqulyn Bath   Depression    on prozac previously   Hyperlipidemia    Miscarriage    Ulcerative colitis (HCC)    Past Surgical History:  Procedure Laterality Date   CARDIAC SURGERY     as a child   EYE SURGERY  10/30/2009   repair of detached retina   TONSILLECTOMY  06/17/1963   TUBAL LIGATION  1986   Patient Active Problem List   Diagnosis Date Noted   Varicose vein of leg 09/30/2022   DDD (degenerative disc disease), cervical 08/22/2021   Osteoarthritis of right acromioclavicular joint 07/26/2021   Memory change 02/05/2021   Lumbar degenerative disc disease 10/04/2018   Acute shoulder bursitis, left 05/26/2018   Tibial plateau fracture, left 09/30/2017   Status post cardiac surgery 09/05/2013   Epiretinal membrane 12/10/2012   Osteoporosis 10/10/2010   BENIGN POSITIONAL VERTIGO 04/05/2010   Regional enteritis (HCC) 07/30/2009   Headache 07/30/2009   Asymptomatic postmenopausal status 04/25/2009   Anxiety state 09/20/2007   Insomnia 09/20/2007    PCP: Agapito Games, MD   REFERRING PROVIDER: Monica Becton, MD   REFERRING DIAG: S82.142D (ICD-10-CM) - Closed fracture of left tibial plateau with routine healing, subsequent encounter   THERAPY DIAG:  Acute pain of left knee  Stiffness of left knee, not elsewhere  classified  Localized edema  Other abnormalities of gait and mobility  Muscle weakness (generalized)  Rationale for Evaluation and Treatment: Rehabilitation  ONSET DATE: 04/12/23  SUBJECTIVE:   SUBJECTIVE STATEMENT: Patient sustained a fall 04/12/23 from the dog running into her. She was unable to bear weight right after the fall. She went to urgent care the following day and had an X-ray and was referred to Dr. Karie Schwalbe. She saw Dr. Karie Schwalbe a few days later and has been NWB in knee immobilizer since her visit with Dr. Karie Schwalbe due to tibial plateau fracture. She needs assistance getting up/down the stairs and getting dressed. She is not completing housework right now either.   PERTINENT HISTORY: Lt tibial plateau fracture 04/12/23 Per patient osteoporosis  PAIN:  Are you having pain? Yes: NPRS scale: 8/10 Pain location: Lt knee/lower leg Pain description: "uncomfortable" Aggravating factors: overactivity Relieving factors: rest  PRECAUTIONS: Other: Per Dr. Karie Schwalbe: 2 weeks of PROM, then 2 weeks of AROM  RED FLAGS: None   WEIGHT BEARING RESTRICTIONS: Yes NWB LLE until next f/u with Dr. Juventino Slovak:  Has patient fallen in last 6 months? Yes. Number of falls 1;dog ran into her and she fell  LIVING ENVIRONMENT: Lives with: lives with their family Lives in: House/apartment Stairs: Yes: Internal: flight steps; can reach both Has following equipment at home: Single point cane, Quad cane small base,  Walker - 2 wheeled, and Wheelchair (manual)  OCCUPATION: retired  PLOF: Independent  PATIENT GOALS: "be able to walk again"  NEXT MD VISIT: 05/25/23  OBJECTIVE:  Note: Objective measures were completed at Evaluation unless otherwise noted.  DIAGNOSTIC FINDINGS:  Lt knee MRI IMPRESSION: 1. Comminuted tibial plateau fracture without significant displacement or depression. CT suggested for more definitive evaluation of the fracture. 2. Extensive horizontal cleavage-type tear involving the  posterior horn and mid body region of the medial meniscus. 3. Intact ligamentous structures. 4. Large lipohemarthrosis.  PATIENT SURVEYS:  FOTO 31% function to 54% predicted  COGNITION: Overall cognitive status: Within functional limits for tasks assessed     SENSATION: Not tested  EDEMA:  Mild swelling about Lt knee    POSTURE: Lt knee in knee immobilizer   PALPATION: TTP Lt anterior knee   LOWER EXTREMITY ROM:  Passive ROM Right eval Left eval  Hip flexion    Hip extension    Hip abduction    Hip adduction    Hip internal rotation    Hip external rotation    Knee flexion  50  Knee extension  0  Ankle dorsiflexion    Ankle plantarflexion    Ankle inversion    Ankle eversion     (Blank rows = not tested)  LOWER EXTREMITY MMT:  MMT Right eval Left eval  Hip flexion    Hip extension    Hip abduction    Hip adduction    Hip internal rotation    Hip external rotation    Knee flexion  deferred  Knee extension  deferred  Ankle dorsiflexion    Ankle plantarflexion    Ankle inversion    Ankle eversion     (Blank rows = not tested)  LOWER EXTREMITY SPECIAL TESTS:  N/A  FUNCTIONAL TESTS:  N/A  GAIT: Distance walked: 10 ft  Assistive device utilized: Walker - 2 wheeled Level of assistance: Modified independence Comments: NWB LLE in knee immobilizer    [facility] Adult PT Treatment:                                                DATE: 05/12/23 Therapeutic Exercise: Demonstrated and issue initial HEP.  Instructed spouse on PROM of the Lt knee  Manual Therapy: Lt knee PROM to tolerance flexion/extension  Self Care: Ice for swelling/pain     PATIENT EDUCATION:  Education details: see treatment; POC; assessment findings  Person educated: Patient and Spouse Education method: Explanation, Demonstration, Tactile cues, Verbal cues, and Handouts Education comprehension: verbalized understanding, returned demonstration, verbal cues required, tactile cues  required, and needs further education  HOME EXERCISE PROGRAM: Access Code: [email or web address] URL: [email or web address] Date: 05/12/2023 Prepared by: Letitia Libra  Exercises - Supine Hip and Knee Flexion PROM with Caregiver  - 1 x daily - 7 x weekly - 3-5 minutes  hold - Long Sitting Calf Stretch with Strap  - 1 x daily - 7 x weekly - 3 sets - 30 sec  hold - Ankle and Toe Plantarflexion with Resistance  - 1 x daily - 7 x weekly - 2 sets - 10 reps - Sidelying Hip Abduction  - 1 x daily - 7 x weekly - 2 sets - 10 reps  ASSESSMENT:  CLINICAL IMPRESSION: Patient is a 67 y.o. female who was seen today for physical therapy evaluation and treatment  for Lt tibial plateau fracture that she sustained from a fall on 04/12/23. She is currently NWB in a knee immobilizer on the LLE with instructions to remain NWB until MD f/u on 05/25/23. She has ROM precautions as she is allowed to complete PROM only at the knee for the next 2 weeks and can then begin AROM. She is noted to have full Lt knee extension, but limited passive knee flexion. She will benefit from skilled PT to address ROM, strength, gait, and balance deficits per MD clearance in order to return to PLOF.    OBJECTIVE IMPAIRMENTS: Abnormal gait, decreased activity tolerance, decreased balance, decreased knowledge of condition, decreased mobility, difficulty walking, decreased ROM, decreased strength, increased edema, improper body mechanics, postural dysfunction, and pain.   ACTIVITY LIMITATIONS: carrying, lifting, bending, standing, squatting, sleeping, stairs, transfers, bed mobility, bathing, dressing, reach over head, hygiene/grooming, and locomotion level  PARTICIPATION LIMITATIONS: meal prep, cleaning, laundry, driving, shopping, community activity, and yard work  PERSONAL FACTORS: Age, Fitness, Time since onset of injury/illness/exacerbation, and 3+ comorbidities: see PMH above  are also affecting patient's functional outcome.    REHAB POTENTIAL: Good  CLINICAL DECISION MAKING: Stable/uncomplicated  EVALUATION COMPLEXITY: Low   GOALS: Goals reviewed with patient? Yes  SHORT TERM GOALS: Target date: 06/09/2023   Patient will be independent and compliant with initial HEP.   Baseline: Goal status: INITIAL  2.  Patient will demonstrate at least 90 degrees of Lt knee AROM to improve ability to complete sit to stand transfer.  Baseline: 50 PROM Goal status: INITIAL  3.  Patient will ambulate with step through pattern with LRAD once cleared by MD for weight-bearing.  Baseline: NWB LLE  Goal status: INITIAL   LONG TERM GOALS: Target date: 07/11/23  Patient will score at least 54% on FOTO to signify clinically meaningful improvement in functional abilities.   Baseline: see above Goal status: INITIAL  2.  Patient will demonstrate 5/5 Lt knee strength to improve gait stability.  Baseline: deferred  Goal status: INITIAL  3.  Patient will demonstrate functional Lt knee AROM to improve gait mechanics.  Baseline: see above Goal status: INITIAL  4.  Patient will be able to ascend/descend stairs with reciprocal pattern with minimal/no Lt knee pain Baseline: NWB LLE Goal status: INITIAL  5.  Patient will be able to complete household tasks with minimal/no Lt knee pain Baseline: unable to cook, clean, etc.  Goal status: INITIAL  6.  Patient will be independent with advanced home program to progress/maintain current level of function.  Baseline: initial HEP issued  Goal status: INITIAL   PLAN:  PT FREQUENCY: 1-2x/week  PT DURATION: 8 weeks  PLANNED INTERVENTIONS: 97164- PT Re-evaluation, 97110-Therapeutic exercises, 97530- Therapeutic activity, [id number]- Neuromuscular re-education, 97535- Self Care, [id number]- Manual therapy, [id number]- Gait training, [id number]- Aquatic Therapy, 97014- Electrical stimulation (unattended), [id number]- Electrical stimulation (manual), 97016- Vasopneumatic device, [id number]- Ionotophoresis  4mg /ml Dexamethasone, Dry Needling, Cryotherapy, and Moist heat  PLAN FOR NEXT SESSION: Lt knee PROM only until 05/25/23. NWB LLE until next f/u with Dr. Karie Schwalbe. Hip strengthening/ankle strengthening    Letitia Libra, PT, DPT, ATC 05/12/23 11:33 AM  Referring diagnosis?  Closed fracture of left tibial plateau with routine healing, subsequent encounter  Treatment diagnosis? (if different than referring diagnosis)   Acute pain of left knee  Stiffness of left knee, not elsewhere classified  Localized edema  Other abnormalities of gait and mobility  Muscle weakness (generalized)  What was this (referring dx) caused by? []  Surgery [x]  Fall []   Ongoing issue []  Arthritis []  Other: ____________  Laterality: []  Rt [x]  Lt []  Both  Check all possible CPT codes:  *CHOOSE 10 OR LESS*    See Planned Interventions listed in the Plan section of the Evaluation.

## 2023-05-12 ENCOUNTER — Other Ambulatory Visit: Payer: Self-pay

## 2023-05-12 ENCOUNTER — Ambulatory Visit: Payer: Medicare HMO | Attending: Sports Medicine

## 2023-05-12 DIAGNOSIS — M25662 Stiffness of left knee, not elsewhere classified: Secondary | ICD-10-CM | POA: Insufficient documentation

## 2023-05-12 DIAGNOSIS — R600 Localized edema: Secondary | ICD-10-CM | POA: Diagnosis not present

## 2023-05-12 DIAGNOSIS — M6281 Muscle weakness (generalized): Secondary | ICD-10-CM | POA: Diagnosis not present

## 2023-05-12 DIAGNOSIS — R2689 Other abnormalities of gait and mobility: Secondary | ICD-10-CM | POA: Insufficient documentation

## 2023-05-12 DIAGNOSIS — M25562 Pain in left knee: Secondary | ICD-10-CM | POA: Insufficient documentation

## 2023-05-20 ENCOUNTER — Ambulatory Visit: Payer: Medicare HMO | Attending: Sports Medicine

## 2023-05-20 DIAGNOSIS — M6281 Muscle weakness (generalized): Secondary | ICD-10-CM | POA: Diagnosis present

## 2023-05-20 DIAGNOSIS — M25662 Stiffness of left knee, not elsewhere classified: Secondary | ICD-10-CM | POA: Insufficient documentation

## 2023-05-20 DIAGNOSIS — M25562 Pain in left knee: Secondary | ICD-10-CM | POA: Insufficient documentation

## 2023-05-20 DIAGNOSIS — R600 Localized edema: Secondary | ICD-10-CM | POA: Insufficient documentation

## 2023-05-20 DIAGNOSIS — R2689 Other abnormalities of gait and mobility: Secondary | ICD-10-CM | POA: Diagnosis present

## 2023-05-20 NOTE — Therapy (Signed)
OUTPATIENT PHYSICAL THERAPY LOWER EXTREMITY TREATMENT   Patient Name: Diane Chavez MRN: [id number] DOB:12-17-1955, 67 y.o., female Today's Date: 05/20/2023  END OF SESSION:  PT End of Session - 05/20/23 0926     Visit Number 2    Number of Visits 17    Date for PT Re-Evaluation 07/11/23    Authorization Type [employer]-auth pending    PT Start Time 0927    PT Stop Time 1009    PT Time Calculation (min) 42 min    Activity Tolerance Patient tolerated treatment well    Behavior During Therapy [facility] for tasks assessed/performed              Past Medical History:  Diagnosis Date   Anxiety    Crohn disease (HCC)    Dr. Jacqulyn Bath   Depression    on prozac previously   Hyperlipidemia    Miscarriage    Ulcerative colitis (HCC)    Past Surgical History:  Procedure Laterality Date   CARDIAC SURGERY     as a child   EYE SURGERY  10/30/2009   repair of detached retina   TONSILLECTOMY  06/17/1963   TUBAL LIGATION  1986   Patient Active Problem List   Diagnosis Date Noted   Varicose vein of leg 09/30/2022   DDD (degenerative disc disease), cervical 08/22/2021   Osteoarthritis of right acromioclavicular joint 07/26/2021   Memory change 02/05/2021   Lumbar degenerative disc disease 10/04/2018   Acute shoulder bursitis, left 05/26/2018   Tibial plateau fracture, left 09/30/2017   Status post cardiac surgery 09/05/2013   Epiretinal membrane 12/10/2012   Osteoporosis 10/10/2010   BENIGN POSITIONAL VERTIGO 04/05/2010   Regional enteritis (HCC) 07/30/2009   Headache 07/30/2009   Asymptomatic postmenopausal status 04/25/2009   Anxiety state 09/20/2007   Insomnia 09/20/2007    PCP: Agapito Games, MD   REFERRING PROVIDER: Monica Becton, MD   REFERRING DIAG: S82.142D (ICD-10-CM) - Closed fracture of left tibial plateau with routine healing, subsequent encounter   THERAPY DIAG:  Acute pain of left knee  Stiffness of left knee, not elsewhere  classified  Localized edema  Other abnormalities of gait and mobility  Muscle weakness (generalized)  Rationale for Evaluation and Treatment: Rehabilitation  ONSET DATE: 04/12/23  SUBJECTIVE:   SUBJECTIVE STATEMENT: Patient reports she has not completed much of her exercises due to Thanksgiving and being sick over the weekend. Her whole leg was really sore after she tried doing her exercise once last week. No pain currently. She has f/u with Dr. Karie Schwalbe on 12/9.   EVAL: Patient sustained a fall 04/12/23 from the dog running into her. She was unable to bear weight right after the fall. She went to urgent care the following day and had an X-ray and was referred to Dr. Karie Schwalbe. She saw Dr. Karie Schwalbe a few days later and has been NWB in knee immobilizer since her visit with Dr. Karie Schwalbe due to tibial plateau fracture. She needs assistance getting up/down the stairs and getting dressed. She is not completing housework right now either.   PERTINENT HISTORY: Lt tibial plateau fracture 04/12/23 Per patient osteoporosis  PAIN:  Are you having pain? Yes: NPRS scale: none currently; 3 at worst/10 Pain location: Lt knee/lower leg Pain description: "uncomfortable" Aggravating factors: overactivity Relieving factors: rest  PRECAUTIONS: Other: Per Dr. Karie Schwalbe: 2 weeks of PROM, then 2 weeks of AROM  RED FLAGS: None   WEIGHT BEARING RESTRICTIONS: Yes NWB LLE until next f/u with Dr. Karie Schwalbe  FALLS:  Has patient fallen in last 6 months? Yes. Number of falls 1;dog ran into her and she fell  LIVING ENVIRONMENT: Lives with: lives with their family Lives in: House/apartment Stairs: Yes: Internal: flight steps; can reach both Has following equipment at home: Single point cane, Quad cane small base, Walker - 2 wheeled, and Wheelchair (manual)  OCCUPATION: retired  PLOF: Independent  PATIENT GOALS: "be able to walk again"  NEXT MD VISIT: 05/25/23  OBJECTIVE:  Note: Objective measures were completed at Evaluation unless  otherwise noted.  DIAGNOSTIC FINDINGS:  Lt knee MRI IMPRESSION: 1. Comminuted tibial plateau fracture without significant displacement or depression. CT suggested for more definitive evaluation of the fracture. 2. Extensive horizontal cleavage-type tear involving the posterior horn and mid body region of the medial meniscus. 3. Intact ligamentous structures. 4. Large lipohemarthrosis.  PATIENT SURVEYS:  FOTO 31% function to 54% predicted  COGNITION: Overall cognitive status: Within functional limits for tasks assessed     SENSATION: Not tested  EDEMA:  Mild swelling about Lt knee    POSTURE: Lt knee in knee immobilizer   PALPATION: TTP Lt anterior knee   LOWER EXTREMITY ROM:  Passive ROM Right eval Left eval 05/20/23 Left   Hip flexion     Hip extension     Hip abduction     Hip adduction     Hip internal rotation     Hip external rotation     Knee flexion  50 80  Knee extension  0   Ankle dorsiflexion     Ankle plantarflexion     Ankle inversion     Ankle eversion      (Blank rows = not tested)  LOWER EXTREMITY MMT:  MMT Right eval Left eval  Hip flexion    Hip extension    Hip abduction    Hip adduction    Hip internal rotation    Hip external rotation    Knee flexion  deferred  Knee extension  deferred  Ankle dorsiflexion    Ankle plantarflexion    Ankle inversion    Ankle eversion     (Blank rows = not tested)  LOWER EXTREMITY SPECIAL TESTS:  N/A  FUNCTIONAL TESTS:  N/A  GAIT: Distance walked: 10 ft  Assistive device utilized: Environmental consultant - 2 wheeled Level of assistance: Modified independence Comments: NWB LLE in knee immobilizer   [facility] Adult PT Treatment:                                                DATE: 05/20/23 Therapeutic Exercise: Calf stretch with towel x 30 sec Resisted PF black band x 10  Sidelying hip abduction x 10 with knee immobilizer donned  Reviewed HEP and instructed spouse on PROM of the Lt knee  Manual  Therapy: Lt knee PROM to tolerance flexion/extension; sustained stretch at available end range of flexion   Self Care: Discussed knee anatomy as it relates to her current injury   [facility] Adult PT Treatment:                                                DATE: 05/12/23 Therapeutic Exercise: Demonstrated and issue initial HEP.  Instructed spouse on PROM of the  Lt knee  Manual Therapy: Lt knee PROM to tolerance flexion/extension  Self Care: Ice for swelling/pain     PATIENT EDUCATION:  Education details: HEP review  Person educated: Patient and Spouse Education method: Explanation, Demonstration, Actor cues, and Verbal cues Education comprehension: verbalized understanding, returned demonstration, verbal cues required, tactile cues required, and needs further education  HOME EXERCISE PROGRAM: Access Code: [email or web address] URL: [email or web address] Date: 05/12/2023 Prepared by: Letitia Libra  Exercises - Supine Hip and Knee Flexion PROM with Caregiver  - 1 x daily - 7 x weekly - 3-5 minutes  hold - Long Sitting Calf Stretch with Strap  - 1 x daily - 7 x weekly - 3 sets - 30 sec  hold - Ankle and Toe Plantarflexion with Resistance  - 1 x daily - 7 x weekly - 2 sets - 10 reps - Sidelying Hip Abduction  - 1 x daily - 7 x weekly - 2 sets - 10 reps  ASSESSMENT:  CLINICAL IMPRESSION: Patient reports not being able to complete much of initial prescribed HEP since evaluation due to sickness/Thanksgiving holiday. She has better tolerance to PROM of the Lt knee today achieving 80 degrees of passive flexion. Reviewed and performed initial HEP discussing frequency, sets, and reps of all prescribed exercises with patient verbalizing understanding. No reports of pain with ther ex.   EVAL: Patient is a 67 y.o. female who was seen today for physical therapy evaluation and treatment for Lt tibial plateau fracture that she sustained from a fall on 04/12/23. She is currently NWB in a knee  immobilizer on the LLE with instructions to remain NWB until MD f/u on 05/25/23. She has ROM precautions as she is allowed to complete PROM only at the knee for the next 2 weeks and can then begin AROM. She is noted to have full Lt knee extension, but limited passive knee flexion. She will benefit from skilled PT to address ROM, strength, gait, and balance deficits per MD clearance in order to return to PLOF.    OBJECTIVE IMPAIRMENTS: Abnormal gait, decreased activity tolerance, decreased balance, decreased knowledge of condition, decreased mobility, difficulty walking, decreased ROM, decreased strength, increased edema, improper body mechanics, postural dysfunction, and pain.   ACTIVITY LIMITATIONS: carrying, lifting, bending, standing, squatting, sleeping, stairs, transfers, bed mobility, bathing, dressing, reach over head, hygiene/grooming, and locomotion level  PARTICIPATION LIMITATIONS: meal prep, cleaning, laundry, driving, shopping, community activity, and yard work  PERSONAL FACTORS: Age, Fitness, Time since onset of injury/illness/exacerbation, and 3+ comorbidities: see PMH above  are also affecting patient's functional outcome.   REHAB POTENTIAL: Good  CLINICAL DECISION MAKING: Stable/uncomplicated  EVALUATION COMPLEXITY: Low   GOALS: Goals reviewed with patient? Yes  SHORT TERM GOALS: Target date: 06/09/2023   Patient will be independent and compliant with initial HEP.   Baseline: Goal status: INITIAL  2.  Patient will demonstrate at least 90 degrees of Lt knee AROM to improve ability to complete sit to stand transfer.  Baseline: 50 PROM Goal status: INITIAL  3.  Patient will ambulate with step through pattern with LRAD once cleared by MD for weight-bearing.  Baseline: NWB LLE  Goal status: INITIAL   LONG TERM GOALS: Target date: 07/11/23  Patient will score at least 54% on FOTO to signify clinically meaningful improvement in functional abilities.   Baseline: see  above Goal status: INITIAL  2.  Patient will demonstrate 5/5 Lt knee strength to improve gait stability.  Baseline: deferred  Goal status: INITIAL  3.  Patient will  demonstrate functional Lt knee AROM to improve gait mechanics.  Baseline: see above Goal status: INITIAL  4.  Patient will be able to ascend/descend stairs with reciprocal pattern with minimal/no Lt knee pain Baseline: NWB LLE Goal status: INITIAL  5.  Patient will be able to complete household tasks with minimal/no Lt knee pain Baseline: unable to cook, clean, etc.  Goal status: INITIAL  6.  Patient will be independent with advanced home program to progress/maintain current level of function.  Baseline: initial HEP issued  Goal status: INITIAL   PLAN:  PT FREQUENCY: 1-2x/week  PT DURATION: 8 weeks  PLANNED INTERVENTIONS: 97164- PT Re-evaluation, 97110-Therapeutic exercises, 97530- Therapeutic activity, [id number]- Neuromuscular re-education, 97535- Self Care, 16109- Manual therapy, [id number]- Gait training, [id number]- Aquatic Therapy, 97014- Electrical stimulation (unattended), [id number]- Electrical stimulation (manual), 97016- Vasopneumatic device, [id number]- Ionotophoresis 4mg /ml Dexamethasone, Dry Needling, Cryotherapy, and Moist heat  PLAN FOR NEXT SESSION: Lt knee PROM only until 05/25/23. NWB LLE until next f/u with Dr. Karie Schwalbe. Hip strengthening/ankle strengthening    Letitia Libra, PT, DPT, ATC 05/20/23 10:11 AM

## 2023-05-25 ENCOUNTER — Ambulatory Visit (INDEPENDENT_AMBULATORY_CARE_PROVIDER_SITE_OTHER): Payer: Medicare HMO | Admitting: Sports Medicine

## 2023-05-25 ENCOUNTER — Encounter: Payer: Self-pay | Admitting: Sports Medicine

## 2023-05-25 DIAGNOSIS — S82142D Displaced bicondylar fracture of left tibia, subsequent encounter for closed fracture with routine healing: Secondary | ICD-10-CM

## 2023-05-25 NOTE — Assessment & Plan Note (Signed)
This exquisitely pleasant 67 year old female returns, she had a fall at the end of October, ultimately we diagnosed a tibial plateau fracture, we did an aspiration of hemarthrosis. MRI at the time also showed a medial meniscal tear. She has been nonweightbearing in a knee immobilizer, she is doing some PT. She is now approximately 6 weeks status post injury and doing a lot better, she is able to bear weight without significant pain however she does have tenderness medial tibial plateau but not quite at the joint line. She does have an effusion today. I think she can transition from the immobilizer into a reaction knee brace, she will continue therapy, she can weight-bear as tolerated but with some sort of assistive device for safety. Return to see me in about 4 to 6 weeks, and if still having pain we will consider a knee joint injection to address the meniscal pain. At that point I would expect the bone to be healed.

## 2023-05-25 NOTE — Progress Notes (Signed)
    Procedures performed today:    None.  Independent interpretation of notes and tests performed by another provider:   None.  Brief History, Exam, Impression, and Recommendations:    Tibial plateau fracture, left This exquisitely pleasant 67 year old female returns, she had a fall at the end of October, ultimately we diagnosed a tibial plateau fracture, we did an aspiration of hemarthrosis. MRI at the time also showed a medial meniscal tear. She has been nonweightbearing in a knee immobilizer, she is doing some PT. She is now approximately 6 weeks status post injury and doing a lot better, she is able to bear weight without significant pain however she does have tenderness medial tibial plateau but not quite at the joint line. She does have an effusion today. I think she can transition from the immobilizer into a reaction knee brace, she will continue therapy, she can weight-bear as tolerated but with some sort of assistive device for safety. Return to see me in about 4 to 6 weeks, and if still having pain we will consider a knee joint injection to address the meniscal pain. At that point I would expect the bone to be healed.    ____________________________________________ Ihor Austin. Benjamin Stain, M.D., ABFM., CAQSM., AME. Primary Care and Sports Medicine [facility]  Adjunct Professor of Family Medicine  [employer]  Restaurant manager, fast food

## 2023-05-26 ENCOUNTER — Ambulatory Visit: Payer: Medicare HMO

## 2023-05-26 DIAGNOSIS — M6281 Muscle weakness (generalized): Secondary | ICD-10-CM

## 2023-05-26 DIAGNOSIS — M25662 Stiffness of left knee, not elsewhere classified: Secondary | ICD-10-CM | POA: Diagnosis not present

## 2023-05-26 DIAGNOSIS — M25562 Pain in left knee: Secondary | ICD-10-CM | POA: Diagnosis not present

## 2023-05-26 DIAGNOSIS — R600 Localized edema: Secondary | ICD-10-CM | POA: Diagnosis not present

## 2023-05-26 DIAGNOSIS — R2689 Other abnormalities of gait and mobility: Secondary | ICD-10-CM | POA: Diagnosis not present

## 2023-05-26 NOTE — Therapy (Signed)
OUTPATIENT PHYSICAL THERAPY LOWER EXTREMITY TREATMENT   Patient Name: Diane Chavez MRN: [id number] DOB:07/03/1955, 67 y.o., female Today's Date: 05/26/2023  END OF SESSION:  PT End of Session - 05/26/23 0927     Visit Number 3    Number of Visits 17    Date for PT Re-Evaluation 07/11/23    Authorization Type [employer]-auth pending    Authorization Time Period 11/26-1/25/25    Authorization - Visit Number 3    Authorization - Number of Visits 17    PT Start Time 0930    PT Stop Time 1010    PT Time Calculation (min) 40 min    Activity Tolerance Patient tolerated treatment well    Behavior During Therapy [facility] for tasks assessed/performed               Past Medical History:  Diagnosis Date   Anxiety    Crohn disease (HCC)    Dr. Jacqulyn Bath   Depression    on prozac previously   Hyperlipidemia    Miscarriage    Ulcerative colitis (HCC)    Past Surgical History:  Procedure Laterality Date   CARDIAC SURGERY     as a child   EYE SURGERY  10/30/2009   repair of detached retina   TONSILLECTOMY  06/17/1963   TUBAL LIGATION  1986   Patient Active Problem List   Diagnosis Date Noted   Varicose vein of leg 09/30/2022   DDD (degenerative disc disease), cervical 08/22/2021   Osteoarthritis of right acromioclavicular joint 07/26/2021   Memory change 02/05/2021   Lumbar degenerative disc disease 10/04/2018   Acute shoulder bursitis, left 05/26/2018   Tibial plateau fracture, left 09/30/2017   Status post cardiac surgery 09/05/2013   Epiretinal membrane 12/10/2012   Osteoporosis 10/10/2010   BENIGN POSITIONAL VERTIGO 04/05/2010   Regional enteritis (HCC) 07/30/2009   Headache 07/30/2009   Asymptomatic postmenopausal status 04/25/2009   Anxiety state 09/20/2007   Insomnia 09/20/2007    PCP: Agapito Games, MD   REFERRING PROVIDER: Monica Becton, MD   REFERRING DIAG: S82.142D (ICD-10-CM) - Closed fracture of left tibial plateau with routine healing,  subsequent encounter   THERAPY DIAG:  Acute pain of left knee  Stiffness of left knee, not elsewhere classified  Localized edema  Other abnormalities of gait and mobility  Muscle weakness (generalized)  Rationale for Evaluation and Treatment: Rehabilitation  ONSET DATE: 04/12/23  SUBJECTIVE:   SUBJECTIVE STATEMENT: Patient had f/u with Dr. Karie Schwalbe yesterday and is now WBAT with AD. She has been using a platform walker since yesterday and has not had pain with ambulating with AD. The knee feels tight, but does not hurt. She has next f/u in 4 weeks.   EVAL: Patient sustained a fall 04/12/23 from the dog running into her. She was unable to bear weight right after the fall. She went to urgent care the following day and had an X-ray and was referred to Dr. Karie Schwalbe. She saw Dr. Karie Schwalbe a few days later and has been NWB in knee immobilizer since her visit with Dr. Karie Schwalbe due to tibial plateau fracture. She needs assistance getting up/down the stairs and getting dressed. She is not completing housework right now either.   PERTINENT HISTORY: Lt tibial plateau fracture 04/12/23 Per patient osteoporosis  PAIN:  Are you having pain? No   PRECAUTIONS: Other: see weightbearing restrictions  RED FLAGS: None   WEIGHT BEARING RESTRICTIONS: Yes WBAT with AD  FALLS:  Has patient fallen in last  6 months? Yes. Number of falls 1;dog ran into her and she fell  LIVING ENVIRONMENT: Lives with: lives with their family Lives in: House/apartment Stairs: Yes: Internal: flight steps; can reach both Has following equipment at home: Single point cane, Quad cane small base, Walker - 2 wheeled, and Wheelchair (manual)  OCCUPATION: retired  PLOF: Independent  PATIENT GOALS: "be able to walk again"  NEXT MD VISIT: 05/25/23  OBJECTIVE:  Note: Objective measures were completed at Evaluation unless otherwise noted.  DIAGNOSTIC FINDINGS:  Lt knee MRI IMPRESSION: 1. Comminuted tibial plateau fracture without  significant displacement or depression. CT suggested for more definitive evaluation of the fracture. 2. Extensive horizontal cleavage-type tear involving the posterior horn and mid body region of the medial meniscus. 3. Intact ligamentous structures. 4. Large lipohemarthrosis.  PATIENT SURVEYS:  FOTO 31% function to 54% predicted  COGNITION: Overall cognitive status: Within functional limits for tasks assessed     SENSATION: Not tested  EDEMA:  Mild swelling about Lt knee    POSTURE: Lt knee in knee immobilizer   PALPATION: TTP Lt anterior knee   LOWER EXTREMITY ROM:  Passive ROM Right eval Left eval 05/20/23 Left  05/26/23 Left   Hip flexion      Hip extension      Hip abduction      Hip adduction      Hip internal rotation      Hip external rotation      Knee flexion  50 80 AROM: 80   Knee extension  0    Ankle dorsiflexion      Ankle plantarflexion      Ankle inversion      Ankle eversion       (Blank rows = not tested)  LOWER EXTREMITY MMT:  MMT Right eval Left eval  Hip flexion    Hip extension    Hip abduction    Hip adduction    Hip internal rotation    Hip external rotation    Knee flexion  deferred  Knee extension  deferred  Ankle dorsiflexion    Ankle plantarflexion    Ankle inversion    Ankle eversion     (Blank rows = not tested)  LOWER EXTREMITY SPECIAL TESTS:  N/A  FUNCTIONAL TESTS:  N/A  GAIT: Distance walked: 10 ft  Assistive device utilized: Environmental consultant - 2 wheeled Level of assistance: Modified independence Comments: NWB LLE in knee immobilizer  [facility] Adult PT Treatment:                                                DATE: 05/26/23 Therapeutic Exercise: Heel slides x 10; 5 sec hold  Quad sets x 10; 5 sec hold SAQ 2 x 10  Knee flexion AAROM in sitting x 10; 5 sec hold  Lateral weight shifts 2 x 10  A/P weight shifts 2 x 10  Updated HEP   Gait training: With RW x 20 ft step to pattern    [facility] Adult PT Treatment:                                                 DATE: 05/20/23 Therapeutic Exercise: Calf stretch with towel x 30 sec Resisted PF  black band x 10  Sidelying hip abduction x 10 with knee immobilizer donned  Reviewed HEP and instructed spouse on PROM of the Lt knee  Manual Therapy: Lt knee PROM to tolerance flexion/extension; sustained stretch at available end range of flexion   Self Care: Discussed knee anatomy as it relates to her current injury   [facility] Adult PT Treatment:                                                DATE: 05/12/23 Therapeutic Exercise: Demonstrated and issue initial HEP.  Instructed spouse on PROM of the Lt knee  Manual Therapy: Lt knee PROM to tolerance flexion/extension  Self Care: Ice for swelling/pain     PATIENT EDUCATION:  Education details: HEP update   Person educated: Patient and Spouse Education method: Explanation, Demonstration, Tactile cues, and Verbal cues Education comprehension: verbalized understanding, returned demonstration, verbal cues required, tactile cues required, and needs further education  HOME EXERCISE PROGRAM: Access Code: [email or web address] URL: [email or web address] Date: 05/26/2023 Prepared by: Letitia Libra  Exercises - Long Sitting Calf Stretch with Strap  - 1 x daily - 7 x weekly - 3 sets - 30 sec  hold - Sidelying Hip Abduction  - 1 x daily - 7 x weekly - 2 sets - 10 reps - Supine Heel Slide  - 2 x daily - 7 x weekly - 1 sets - 10 reps - 5 sec hold - Long Sitting Quad Set  - 2 x daily - 7 x weekly - 2 sets - 10 reps - 5 sec  hold - Supine Knee Extension Strengthening  - 2 x daily - 7 x weekly - 2 sets - 10 reps - Seated Knee Flexion AAROM  - 2 x daily - 7 x weekly - 1 sets - 10 reps - 5 sec  hold  ASSESSMENT:  CLINICAL IMPRESSION: Patient had f/u with Dr. Karie Schwalbe and is now WBAT with an AD. We began Lt knee AAROM activity and gentle quad strengthening without onset of pain. Fair VMO activation noted with quad set and SAQ. Able  to complete step to pattern with RW without knee pain and was encouraged to use her RW for ambulation as opposed to platform rollator she brought today.   EVAL: Patient is a 67 y.o. female who was seen today for physical therapy evaluation and treatment for Lt tibial plateau fracture that she sustained from a fall on 04/12/23. She is currently NWB in a knee immobilizer on the LLE with instructions to remain NWB until MD f/u on 05/25/23. She has ROM precautions as she is allowed to complete PROM only at the knee for the next 2 weeks and can then begin AROM. She is noted to have full Lt knee extension, but limited passive knee flexion. She will benefit from skilled PT to address ROM, strength, gait, and balance deficits per MD clearance in order to return to PLOF.    OBJECTIVE IMPAIRMENTS: Abnormal gait, decreased activity tolerance, decreased balance, decreased knowledge of condition, decreased mobility, difficulty walking, decreased ROM, decreased strength, increased edema, improper body mechanics, postural dysfunction, and pain.   ACTIVITY LIMITATIONS: carrying, lifting, bending, standing, squatting, sleeping, stairs, transfers, bed mobility, bathing, dressing, reach over head, hygiene/grooming, and locomotion level  PARTICIPATION LIMITATIONS: meal prep, cleaning, laundry, driving, shopping, community activity, and yard work  PERSONAL FACTORS: Age, Fitness, Time  since onset of injury/illness/exacerbation, and 3+ comorbidities: see PMH above  are also affecting patient's functional outcome.   REHAB POTENTIAL: Good  CLINICAL DECISION MAKING: Stable/uncomplicated  EVALUATION COMPLEXITY: Low   GOALS: Goals reviewed with patient? Yes  SHORT TERM GOALS: Target date: 06/09/2023   Patient will be independent and compliant with initial HEP.   Baseline: Goal status: INITIAL  2.  Patient will demonstrate at least 90 degrees of Lt knee AROM to improve ability to complete sit to stand transfer.   Baseline: 50 PROM Goal status: progressing   3.  Patient will ambulate with step through pattern with LRAD once cleared by MD for weight-bearing.  Baseline: NWB LLE  Goal status: INITIAL   LONG TERM GOALS: Target date: 07/11/23  Patient will score at least 54% on FOTO to signify clinically meaningful improvement in functional abilities.   Baseline: see above Goal status: INITIAL  2.  Patient will demonstrate 5/5 Lt knee strength to improve gait stability.  Baseline: deferred  Goal status: INITIAL  3.  Patient will demonstrate functional Lt knee AROM to improve gait mechanics.  Baseline: see above Goal status: INITIAL  4.  Patient will be able to ascend/descend stairs with reciprocal pattern with minimal/no Lt knee pain Baseline: NWB LLE Goal status: INITIAL  5.  Patient will be able to complete household tasks with minimal/no Lt knee pain Baseline: unable to cook, clean, etc.  Goal status: INITIAL  6.  Patient will be independent with advanced home program to progress/maintain current level of function.  Baseline: initial HEP issued  Goal status: INITIAL   PLAN:  PT FREQUENCY: 1-2x/week  PT DURATION: 8 weeks  PLANNED INTERVENTIONS: 97164- PT Re-evaluation, 97110-Therapeutic exercises, 97530- Therapeutic activity, 97112- Neuromuscular re-education, 97535- Self Care, [id number]- Manual therapy, [id number]- Gait training, [id number]- Aquatic Therapy, 97014- Electrical stimulation (unattended), [id number]- Electrical stimulation (manual), 97016- Vasopneumatic device, [id number]- Ionotophoresis 4mg /ml Dexamethasone, Dry Needling, Cryotherapy, and Moist heat  PLAN FOR NEXT SESSION: gait training with AD; knee AAROM/AROM progression as tolerated. Quad strengthening,    Letitia Libra, PT, DPT, ATC 05/26/23 10:10 AM

## 2023-05-28 ENCOUNTER — Ambulatory Visit: Payer: Medicare HMO

## 2023-05-28 DIAGNOSIS — R2689 Other abnormalities of gait and mobility: Secondary | ICD-10-CM | POA: Diagnosis not present

## 2023-05-28 DIAGNOSIS — R600 Localized edema: Secondary | ICD-10-CM

## 2023-05-28 DIAGNOSIS — M25662 Stiffness of left knee, not elsewhere classified: Secondary | ICD-10-CM

## 2023-05-28 DIAGNOSIS — M6281 Muscle weakness (generalized): Secondary | ICD-10-CM

## 2023-05-28 DIAGNOSIS — M25562 Pain in left knee: Secondary | ICD-10-CM

## 2023-05-28 NOTE — Therapy (Signed)
OUTPATIENT PHYSICAL THERAPY LOWER EXTREMITY TREATMENT   Patient Name: Diane Chavez MRN: [id number] DOB:[birth year], 67 y.o., female Today's Date: 05/28/2023  END OF SESSION:  PT End of Session - 05/28/23 1446     Visit Number 4    Number of Visits 17    Date for PT Re-Evaluation 07/11/23    Authorization Type 17 VISITS APPROVED FOR PT    Authorization Time Period 11/26-1/25/25    Authorization - Visit Number 4    Authorization - Number of Visits 17    PT Start Time 1448    PT Stop Time 1538    PT Time Calculation (min) 50 min    Activity Tolerance Patient tolerated treatment well    Behavior During Therapy WFL for tasks assessed/performed            Past Medical History:  Diagnosis Date   Anxiety    Crohn disease (HCC)    Dr. Jacqulyn Bath   Depression    on prozac previously   Hyperlipidemia    Miscarriage    Ulcerative colitis (HCC)    Past Surgical History:  Procedure Laterality Date   CARDIAC SURGERY     as a child   EYE SURGERY  10/30/2009   repair of detached retina   TONSILLECTOMY  06/17/1963   TUBAL LIGATION  1986   Patient Active Problem List   Diagnosis Date Noted   Varicose vein of leg 09/30/2022   DDD (degenerative disc disease), cervical 08/22/2021   Osteoarthritis of right acromioclavicular joint 07/26/2021   Memory change 02/05/2021   Lumbar degenerative disc disease 10/04/2018   Acute shoulder bursitis, left 05/26/2018   Tibial plateau fracture, left 09/30/2017   Status post cardiac surgery 09/05/2013   Epiretinal membrane 12/10/2012   Osteoporosis 10/10/2010   BENIGN POSITIONAL VERTIGO 04/05/2010   Regional enteritis (HCC) 07/30/2009   Headache 07/30/2009   Asymptomatic postmenopausal status 04/25/2009   Anxiety state 09/20/2007   Insomnia 09/20/2007    PCP: Agapito Games, MD   REFERRING PROVIDER: Monica Becton, MD   REFERRING DIAG: S82.142D (ICD-10-CM) - Closed fracture of left tibial plateau with routine healing,  subsequent encounter   THERAPY DIAG:  Acute pain of left knee  Stiffness of left knee, not elsewhere classified  Localized edema  Other abnormalities of gait and mobility  Muscle weakness (generalized)  Rationale for Evaluation and Treatment: Rehabilitation  ONSET DATE: 04/12/23  SUBJECTIVE:   SUBJECTIVE STATEMENT: Patient arrived with rollator, states it is a much better fit than platform walker. Patient states she   EVAL: Patient sustained a fall 04/12/23 from the dog running into her. She was unable to bear weight right after the fall. She went to urgent care the following day and had an X-ray and was referred to Dr. Karie Schwalbe. She saw Dr. Karie Schwalbe a few days later and has been NWB in knee immobilizer since her visit with Dr. Karie Schwalbe due to tibial plateau fracture. She needs assistance getting up/down the stairs and getting dressed. She is not completing housework right now either.   PERTINENT HISTORY: Lt tibial plateau fracture 04/12/23 Per patient osteoporosis  PAIN:  Are you having pain? No   PRECAUTIONS: Other: see weightbearing restrictions  RED FLAGS: None   WEIGHT BEARING RESTRICTIONS: Yes WBAT with AD  FALLS:  Has patient fallen in last 6 months? Yes. Number of falls 1;dog ran into her and she fell  LIVING ENVIRONMENT: Lives with: lives with their family Lives in: House/apartment Stairs: Yes: Internal: flight  steps; can reach both Has following equipment at home: Single point cane, Quad cane small base, Walker - 2 wheeled, and Wheelchair (manual)  OCCUPATION: retired  PLOF: Independent  PATIENT GOALS: "be able to walk again"  NEXT MD VISIT: 05/25/23  OBJECTIVE:  Note: Objective measures were completed at Evaluation unless otherwise noted.  DIAGNOSTIC FINDINGS:  Lt knee MRI IMPRESSION: 1. Comminuted tibial plateau fracture without significant displacement or depression. CT suggested for more definitive evaluation of the fracture. 2. Extensive horizontal  cleavage-type tear involving the posterior horn and mid body region of the medial meniscus. 3. Intact ligamentous structures. 4. Large lipohemarthrosis.  PATIENT SURVEYS:  FOTO 31% function to 54% predicted  COGNITION: Overall cognitive status: Within functional limits for tasks assessed     SENSATION: Not tested  EDEMA:  Mild swelling about Lt knee    POSTURE: Lt knee in knee immobilizer   PALPATION: TTP Lt anterior knee   LOWER EXTREMITY ROM:  Passive ROM Right eval Left eval 05/20/23 Left  05/26/23 Left   Hip flexion      Hip extension      Hip abduction      Hip adduction      Hip internal rotation      Hip external rotation      Knee flexion  50 80 AROM: 80   Knee extension  0    Ankle dorsiflexion      Ankle plantarflexion      Ankle inversion      Ankle eversion       (Blank rows = not tested)  LOWER EXTREMITY MMT:  MMT Right eval Left eval  Hip flexion    Hip extension    Hip abduction    Hip adduction    Hip internal rotation    Hip external rotation    Knee flexion  deferred  Knee extension  deferred  Ankle dorsiflexion    Ankle plantarflexion    Ankle inversion    Ankle eversion     (Blank rows = not tested)  LOWER EXTREMITY SPECIAL TESTS:  N/A  FUNCTIONAL TESTS:  N/A  GAIT: Distance walked: 10 ft  Assistive device utilized: Environmental consultant - 2 wheeled Level of assistance: Modified independence Comments: NWB LLE in knee immobilizer   [facility] Adult PT Treatment:                                                DATE: 05/28/2023 Therapeutic Exercise: Supine: AROM heel slides --> added strap to increase stretch 10x10" Quad set 10x5"  SAQ 2x10 SLR: parallel x10, ER x10 Seated AAROM heel slides (assist from R leg) Side Lying: Straight leg hip abd x10 Hip extension in abd x10 Therapeutic Activity: Gait training with rollator --> focus on functional knee flexion & heel strike Gait training with NBQC (uses cane getting in/out of  bathroom) Standing weight shifting --> lateral & staggered stance Modalities: Vaso (L) knee, 34 deg, med compression x 10 min   [facility] Adult PT Treatment:                                                DATE: 05/26/23 Therapeutic Exercise: Heel slides x 10; 5 sec hold  Quad sets x  10; 5 sec hold SAQ 2 x 10  Knee flexion AAROM in sitting x 10; 5 sec hold  Lateral weight shifts 2 x 10  A/P weight shifts 2 x 10  Updated HEP  Gait training: With RW x 20 ft step to pattern    [facility] Adult PT Treatment:                                                DATE: 05/20/23 Therapeutic Exercise: Calf stretch with towel x 30 sec Resisted PF black band x 10  Sidelying hip abduction x 10 with knee immobilizer donned  Reviewed HEP and instructed spouse on PROM of the Lt knee  Manual Therapy: Lt knee PROM to tolerance flexion/extension; sustained stretch at available end range of flexion  Self Care: Discussed knee anatomy as it relates to her current injury    PATIENT EDUCATION:  Education details: updated HEP   Person educated: Patient and Spouse Education method: Explanation, Demonstration, Tactile cues, and Verbal cues Education comprehension: verbalized understanding, returned demonstration, verbal cues required, tactile cues required, and needs further education  HOME EXERCISE PROGRAM: Access Code: [email or web address] URL: [email or web address] Date: 05/28/2023 Prepared by: Carlynn Herald  Exercises - Supine Heel Slide with Strap  - 1 x daily - 7 x weekly - 3 sets - 10 reps - Long Sitting Calf Stretch with Strap  - 1 x daily - 7 x weekly - 3 sets - 30 sec  hold - Sidelying Hip Abduction  - 1 x daily - 7 x weekly - 2 sets - 10 reps - Long Sitting Quad Set  - 2 x daily - 7 x weekly - 2 sets - 10 reps - 5 sec  hold - Supine Knee Extension Strengthening  - 2 x daily - 7 x weekly - 2 sets - 10 reps - Seated Knee Flexion AAROM  - 2 x daily - 7 x weekly - 1 sets - 10 reps - 5 sec  hold - Long  Sitting Straight Leg Raise with External Rotation  - 1 x daily - 7 x weekly - 3 sets - 10 reps - 5 sec hold - Sidelying Hip Extension in Abduction  - 1 x daily - 7 x weekly - 3 sets - 10 reps  ASSESSMENT:  CLINICAL IMPRESSION: Quad strengthening exercises continued with good toleration from patient; noted quad weakness and fatigue during straight leg raise variations.Gait training continued with rollator, focusing on functional knee flexion and improving heel strike.   EVAL: Patient is a 67 y.o. female who was seen today for physical therapy evaluation and treatment for Lt tibial plateau fracture that she sustained from a fall on 04/12/23. She is currently NWB in a knee immobilizer on the LLE with instructions to remain NWB until MD f/u on 05/25/23. She has ROM precautions as she is allowed to complete PROM only at the knee for the next 2 weeks and can then begin AROM. She is noted to have full Lt knee extension, but limited passive knee flexion. She will benefit from skilled PT to address ROM, strength, gait, and balance deficits per MD clearance in order to return to PLOF.    OBJECTIVE IMPAIRMENTS: Abnormal gait, decreased activity tolerance, decreased balance, decreased knowledge of condition, decreased mobility, difficulty walking, decreased ROM, decreased strength, increased edema, improper body mechanics, postural dysfunction, and pain.  ACTIVITY LIMITATIONS: carrying, lifting, bending, standing, squatting, sleeping, stairs, transfers, bed mobility, bathing, dressing, reach over head, hygiene/grooming, and locomotion level  PARTICIPATION LIMITATIONS: meal prep, cleaning, laundry, driving, shopping, community activity, and yard work  PERSONAL FACTORS: Age, Fitness, Time since onset of injury/illness/exacerbation, and 3+ comorbidities: see PMH above  are also affecting patient's functional outcome.   REHAB POTENTIAL: Good  CLINICAL DECISION MAKING: Stable/uncomplicated  EVALUATION  COMPLEXITY: Low   GOALS: Goals reviewed with patient? Yes  SHORT TERM GOALS: Target date: 06/09/2023  Patient will be independent and compliant with initial HEP.  Baseline: Goal status: INITIAL  2.  Patient will demonstrate at least 90 degrees of Lt knee AROM to improve ability to complete sit to stand transfer.  Baseline: 50 PROM Goal status: progressing   3.  Patient will ambulate with step through pattern with LRAD once cleared by MD for weight-bearing.  Baseline: NWB LLE  Goal status: INITIAL   LONG TERM GOALS: Target date: 07/11/23  Patient will score at least 54% on FOTO to signify clinically meaningful improvement in functional abilities.  Baseline: see above Goal status: INITIAL  2.  Patient will demonstrate 5/5 Lt knee strength to improve gait stability.  Baseline: deferred  Goal status: INITIAL  3.  Patient will demonstrate functional Lt knee AROM to improve gait mechanics.  Baseline: see above Goal status: INITIAL  4.  Patient will be able to ascend/descend stairs with reciprocal pattern with minimal/no Lt knee pain Baseline: NWB LLE Goal status: INITIAL  5.  Patient will be able to complete household tasks with minimal/no Lt knee pain Baseline: unable to cook, clean, etc.  Goal status: INITIAL  6.  Patient will be independent with advanced home program to progress/maintain current level of function.  Baseline: initial HEP issued  Goal status: INITIAL   PLAN:  PT FREQUENCY: 1-2x/week  PT DURATION: 8 weeks  PLANNED INTERVENTIONS: 97164- PT Re-evaluation, 97110-Therapeutic exercises, 97530- Therapeutic activity, 97112- Neuromuscular re-education, 97535- Self Care, 54098- Manual therapy, [id number]- Gait training, [id number]- Aquatic Therapy, 97014- Electrical stimulation (unattended), [id number]- Electrical stimulation (manual), 97016- Vasopneumatic device, [id number]- Ionotophoresis 4mg /ml Dexamethasone, Dry Needling, Cryotherapy, and Moist heat  PLAN FOR NEXT SESSION:  Gait training with AD; knee AAROM/AROM progression as tolerated. Quad & glute strengthening,    Carlynn Herald, PTA 05/28/23 3:39 PM

## 2023-06-02 ENCOUNTER — Ambulatory Visit: Payer: Medicare HMO

## 2023-06-02 DIAGNOSIS — M6281 Muscle weakness (generalized): Secondary | ICD-10-CM | POA: Diagnosis not present

## 2023-06-02 DIAGNOSIS — M25562 Pain in left knee: Secondary | ICD-10-CM | POA: Diagnosis not present

## 2023-06-02 DIAGNOSIS — R2689 Other abnormalities of gait and mobility: Secondary | ICD-10-CM | POA: Diagnosis not present

## 2023-06-02 DIAGNOSIS — R600 Localized edema: Secondary | ICD-10-CM

## 2023-06-02 DIAGNOSIS — M25662 Stiffness of left knee, not elsewhere classified: Secondary | ICD-10-CM | POA: Diagnosis not present

## 2023-06-02 NOTE — Therapy (Signed)
OUTPATIENT PHYSICAL THERAPY LOWER EXTREMITY TREATMENT   Patient Name: Diane Chavez MRN: [id number] DOB:12/25/1955, 67 y.o., female Today's Date: 06/02/2023  END OF SESSION:  PT End of Session - 06/02/23 0932     Visit Number 5    Number of Visits 17    Date for PT Re-Evaluation 07/11/23    Authorization Type 17 VISITS APPROVED FOR PT    Authorization Time Period 11/26-1/25/25    Authorization - Visit Number 5    Authorization - Number of Visits 17    PT Start Time 0933    PT Stop Time 1015    PT Time Calculation (min) 42 min    Activity Tolerance Patient tolerated treatment well    Behavior During Therapy WFL for tasks assessed/performed            Past Medical History:  Diagnosis Date   Anxiety    Crohn disease (HCC)    Dr. Jacqulyn Bath   Depression    on prozac previously   Hyperlipidemia    Miscarriage    Ulcerative colitis (HCC)    Past Surgical History:  Procedure Laterality Date   CARDIAC SURGERY     as a child   EYE SURGERY  10/30/2009   repair of detached retina   TONSILLECTOMY  06/17/1963   TUBAL LIGATION  1986   Patient Active Problem List   Diagnosis Date Noted   Varicose vein of leg 09/30/2022   DDD (degenerative disc disease), cervical 08/22/2021   Osteoarthritis of right acromioclavicular joint 07/26/2021   Memory change 02/05/2021   Lumbar degenerative disc disease 10/04/2018   Acute shoulder bursitis, left 05/26/2018   Tibial plateau fracture, left 09/30/2017   Status post cardiac surgery 09/05/2013   Epiretinal membrane 12/10/2012   Osteoporosis 10/10/2010   BENIGN POSITIONAL VERTIGO 04/05/2010   Regional enteritis (HCC) 07/30/2009   Headache 07/30/2009   Asymptomatic postmenopausal status 04/25/2009   Anxiety state 09/20/2007   Insomnia 09/20/2007    PCP: Agapito Games, MD   REFERRING PROVIDER: Monica Becton, MD   REFERRING DIAG: S82.142D (ICD-10-CM) - Closed fracture of left tibial plateau with routine healing,  subsequent encounter   THERAPY DIAG:  Acute pain of left knee  Stiffness of left knee, not elsewhere classified  Localized edema  Other abnormalities of gait and mobility  Muscle weakness (generalized)  Rationale for Evaluation and Treatment: Rehabilitation  ONSET DATE: 04/12/23  SUBJECTIVE:   SUBJECTIVE STATEMENT: Patient reports her knee is "sore and stiff" in the morning but decreases as she moves around in the morning.  EVAL: Patient sustained a fall 04/12/23 from the dog running into her. She was unable to bear weight right after the fall. She went to urgent care the following day and had an X-ray and was referred to Dr. Karie Schwalbe. She saw Dr. Karie Schwalbe a few days later and has been NWB in knee immobilizer since her visit with Dr. Karie Schwalbe due to tibial plateau fracture. She needs assistance getting up/down the stairs and getting dressed. She is not completing housework right now either.   PERTINENT HISTORY: Lt tibial plateau fracture 04/12/23 Per patient osteoporosis  PAIN:  Are you having pain? No   PRECAUTIONS: Other: see weightbearing restrictions  RED FLAGS: None   WEIGHT BEARING RESTRICTIONS: Yes WBAT with AD  FALLS:  Has patient fallen in last 6 months? Yes. Number of falls 1;dog ran into her and she fell  LIVING ENVIRONMENT: Lives with: lives with their family Lives in: House/apartment Stairs: Yes:  Internal: flight steps; can reach both Has following equipment at home: Single point cane, Quad cane small base, Walker - 2 wheeled, and Wheelchair (manual)  OCCUPATION: retired  PLOF: Independent  PATIENT GOALS: "be able to walk again"  NEXT MD VISIT: 05/25/23  OBJECTIVE:  Note: Objective measures were completed at Evaluation unless otherwise noted.  DIAGNOSTIC FINDINGS:  Lt knee MRI IMPRESSION: 1. Comminuted tibial plateau fracture without significant displacement or depression. CT suggested for more definitive evaluation of the fracture. 2. Extensive horizontal  cleavage-type tear involving the posterior horn and mid body region of the medial meniscus. 3. Intact ligamentous structures. 4. Large lipohemarthrosis.  PATIENT SURVEYS:  FOTO 31% function to 54% predicted  COGNITION: Overall cognitive status: Within functional limits for tasks assessed     SENSATION: Not tested  EDEMA:  Mild swelling about Lt knee    POSTURE: Lt knee in knee immobilizer   PALPATION: TTP Lt anterior knee   LOWER EXTREMITY ROM:  Passive ROM Right eval Left eval 05/20/23 Left  05/26/23 Left   Hip flexion      Hip extension      Hip abduction      Hip adduction      Hip internal rotation      Hip external rotation      Knee flexion  50 80 AROM: 80   Knee extension  0    Ankle dorsiflexion      Ankle plantarflexion      Ankle inversion      Ankle eversion       (Blank rows = not tested)  LOWER EXTREMITY MMT:  MMT Right eval Left eval  Hip flexion    Hip extension    Hip abduction    Hip adduction    Hip internal rotation    Hip external rotation    Knee flexion  deferred  Knee extension  deferred  Ankle dorsiflexion    Ankle plantarflexion    Ankle inversion    Ankle eversion     (Blank rows = not tested)  LOWER EXTREMITY SPECIAL TESTS:  N/A  FUNCTIONAL TESTS:  N/A  GAIT: Distance walked: 10 ft  Assistive device utilized: Environmental consultant - 2 wheeled Level of assistance: Modified independence Comments: NWB LLE in knee immobilizer    [facility] Adult PT Treatment:                                                DATE: 06/02/2023 Therapeutic Exercise: Long sitting calf stretch Long sitting quad set 10x5" AAROM heel slides with strap 10x10" Long sitting SLR in ER 2x10 Side Lying: Straight leg hip abd x10 Hip extension in abd x10 Seated knee flexion AAROM with slider Supine HS & ITB stretches w/strap 2x30" each (L) Therapeutic Activity: Gait training with rollator --> focus on functional knee flexion & heel strike    [facility] Adult PT  Treatment:                                                DATE: 05/28/2023 Therapeutic Exercise: Supine: AROM heel slides --> added strap to increase stretch 10x10" Quad set 10x5"  SAQ 2x10 SLR: parallel x10, ER x10 Seated AAROM heel slides (assist from R leg)  Side Lying: Straight leg hip abd x10 Hip extension in abd x10 Therapeutic Activity: Gait training with rollator --> focus on functional knee flexion & heel strike Gait training with NBQC (uses cane getting in/out of bathroom) Standing weight shifting --> lateral & staggered stance Modalities: Vaso (L) knee, 34 deg, med compression x 10 min   [facility] Adult PT Treatment:                                                DATE: 05/26/23 Therapeutic Exercise: Heel slides x 10; 5 sec hold  Quad sets x 10; 5 sec hold SAQ 2 x 10  Knee flexion AAROM in sitting x 10; 5 sec hold  Lateral weight shifts 2 x 10  A/P weight shifts 2 x 10  Updated HEP  Gait training: With RW x 20 ft step to pattern    PATIENT EDUCATION:  Education details: updated HEP   Person educated: Patient and Spouse Education method: Explanation, Demonstration, Tactile cues, and Verbal cues Education comprehension: verbalized understanding, returned demonstration, verbal cues required, tactile cues required, and needs further education  HOME EXERCISE PROGRAM: Access Code: [email or web address] URL: [email or web address] Date: 05/28/2023 Prepared by: Carlynn Herald  Exercises - Supine Heel Slide with Strap  - 1 x daily - 7 x weekly - 3 sets - 10 reps - Long Sitting Calf Stretch with Strap  - 1 x daily - 7 x weekly - 3 sets - 30 sec  hold - Sidelying Hip Abduction  - 1 x daily - 7 x weekly - 2 sets - 10 reps - Long Sitting Quad Set  - 2 x daily - 7 x weekly - 2 sets - 10 reps - 5 sec  hold - Supine Knee Extension Strengthening  - 2 x daily - 7 x weekly - 2 sets - 10 reps - Seated Knee Flexion AAROM  - 2 x daily - 7 x weekly - 1 sets - 10 reps - 5 sec  hold -  Long Sitting Straight Leg Raise with External Rotation  - 1 x daily - 7 x weekly - 3 sets - 10 reps - 5 sec hold - Sidelying Hip Extension in Abduction  - 1 x daily - 7 x weekly - 3 sets - 10 reps  ASSESSMENT:  CLINICAL IMPRESSION: Good progression demonstrated with L knee flexion during active assist heel slides. Hip and quad strengthening exercises continued, providing tactile cues as need to improve pelvic stability.   EVAL: Patient is a 67 y.o. female who was seen today for physical therapy evaluation and treatment for Lt tibial plateau fracture that she sustained from a fall on 04/12/23. She is currently NWB in a knee immobilizer on the LLE with instructions to remain NWB until MD f/u on 05/25/23. She has ROM precautions as she is allowed to complete PROM only at the knee for the next 2 weeks and can then begin AROM. She is noted to have full Lt knee extension, but limited passive knee flexion. She will benefit from skilled PT to address ROM, strength, gait, and balance deficits per MD clearance in order to return to PLOF.    OBJECTIVE IMPAIRMENTS: Abnormal gait, decreased activity tolerance, decreased balance, decreased knowledge of condition, decreased mobility, difficulty walking, decreased ROM, decreased strength, increased edema, improper body mechanics, postural dysfunction, and pain.   ACTIVITY LIMITATIONS: carrying, lifting,  bending, standing, squatting, sleeping, stairs, transfers, bed mobility, bathing, dressing, reach over head, hygiene/grooming, and locomotion level  PARTICIPATION LIMITATIONS: meal prep, cleaning, laundry, driving, shopping, community activity, and yard work  PERSONAL FACTORS: Age, Fitness, Time since onset of injury/illness/exacerbation, and 3+ comorbidities: see PMH above  are also affecting patient's functional outcome.   REHAB POTENTIAL: Good  CLINICAL DECISION MAKING: Stable/uncomplicated  EVALUATION COMPLEXITY: Low   GOALS: Goals reviewed with patient?  Yes  SHORT TERM GOALS: Target date: 06/09/2023  Patient will be independent and compliant with initial HEP.  Baseline: Goal status: INITIAL  2.  Patient will demonstrate at least 90 degrees of Lt knee AROM to improve ability to complete sit to stand transfer.  Baseline: 50 PROM Goal status: progressing   3.  Patient will ambulate with step through pattern with LRAD once cleared by MD for weight-bearing.  Baseline: NWB LLE  Goal status: INITIAL   LONG TERM GOALS: Target date: 07/11/23  Patient will score at least 54% on FOTO to signify clinically meaningful improvement in functional abilities.  Baseline: see above Goal status: INITIAL  2.  Patient will demonstrate 5/5 Lt knee strength to improve gait stability.  Baseline: deferred  Goal status: INITIAL  3.  Patient will demonstrate functional Lt knee AROM to improve gait mechanics.  Baseline: see above Goal status: INITIAL  4.  Patient will be able to ascend/descend stairs with reciprocal pattern with minimal/no Lt knee pain Baseline: NWB LLE Goal status: INITIAL  5.  Patient will be able to complete household tasks with minimal/no Lt knee pain Baseline: unable to cook, clean, etc.  Goal status: INITIAL  6.  Patient will be independent with advanced home program to progress/maintain current level of function.  Baseline: initial HEP issued  Goal status: INITIAL   PLAN:  PT FREQUENCY: 1-2x/week  PT DURATION: 8 weeks  PLANNED INTERVENTIONS: 97164- PT Re-evaluation, 97110-Therapeutic exercises, 97530- Therapeutic activity, [id number]- Neuromuscular re-education, 97535- Self Care, 16109- Manual therapy, [id number]- Gait training, [id number]- Aquatic Therapy, 97014- Electrical stimulation (unattended), [id number]- Electrical stimulation (manual), 97016- Vasopneumatic device, [id number]- Ionotophoresis 4mg /ml Dexamethasone, Dry Needling, Cryotherapy, and Moist heat  PLAN FOR NEXT SESSION: Gait training with AD; knee AAROM/AROM progression as  tolerated. Quad & glute strengthening,    Carlynn Herald, PTA 06/02/23 10:15 AM

## 2023-06-04 ENCOUNTER — Ambulatory Visit: Payer: Medicare HMO

## 2023-06-04 DIAGNOSIS — R2689 Other abnormalities of gait and mobility: Secondary | ICD-10-CM

## 2023-06-04 DIAGNOSIS — R600 Localized edema: Secondary | ICD-10-CM | POA: Diagnosis not present

## 2023-06-04 DIAGNOSIS — M6281 Muscle weakness (generalized): Secondary | ICD-10-CM

## 2023-06-04 DIAGNOSIS — M25562 Pain in left knee: Secondary | ICD-10-CM

## 2023-06-04 DIAGNOSIS — M25662 Stiffness of left knee, not elsewhere classified: Secondary | ICD-10-CM

## 2023-06-04 NOTE — Therapy (Signed)
OUTPATIENT PHYSICAL THERAPY LOWER EXTREMITY TREATMENT   Patient Name: Diane Chavez MRN: [id number] DOB:October 13, 1955, 67 y.o., female Today's Date: 06/04/2023  END OF SESSION:  PT End of Session - 06/04/23 0932     Visit Number 6    Number of Visits 17    Date for PT Re-Evaluation 07/11/23    Authorization Type 17 VISITS APPROVED FOR PT    Authorization Time Period 11/26-1/25/25    Authorization - Visit Number 6    Authorization - Number of Visits 17    PT Start Time 0932    PT Stop Time 1020    PT Time Calculation (min) 48 min    Activity Tolerance Patient tolerated treatment well    Behavior During Therapy WFL for tasks assessed/performed            Past Medical History:  Diagnosis Date   Anxiety    Crohn disease (HCC)    Dr. Jacqulyn Bath   Depression    on prozac previously   Hyperlipidemia    Miscarriage    Ulcerative colitis (HCC)    Past Surgical History:  Procedure Laterality Date   CARDIAC SURGERY     as a child   EYE SURGERY  10/30/2009   repair of detached retina   TONSILLECTOMY  06/17/1963   TUBAL LIGATION  1986   Patient Active Problem List   Diagnosis Date Noted   Varicose vein of leg 09/30/2022   DDD (degenerative disc disease), cervical 08/22/2021   Osteoarthritis of right acromioclavicular joint 07/26/2021   Memory change 02/05/2021   Lumbar degenerative disc disease 10/04/2018   Acute shoulder bursitis, left 05/26/2018   Tibial plateau fracture, left 09/30/2017   Status post cardiac surgery 09/05/2013   Epiretinal membrane 12/10/2012   Osteoporosis 10/10/2010   BENIGN POSITIONAL VERTIGO 04/05/2010   Regional enteritis (HCC) 07/30/2009   Headache 07/30/2009   Asymptomatic postmenopausal status 04/25/2009   Anxiety state 09/20/2007   Insomnia 09/20/2007    PCP: Agapito Games, MD   REFERRING PROVIDER: Monica Becton, MD   REFERRING DIAG: S82.142D (ICD-10-CM) - Closed fracture of left tibial plateau with routine healing,  subsequent encounter   THERAPY DIAG:  Acute pain of left knee  Stiffness of left knee, not elsewhere classified  Localized edema  Other abnormalities of gait and mobility  Muscle weakness (generalized)  Rationale for Evaluation and Treatment: Rehabilitation  ONSET DATE: 04/12/23  SUBJECTIVE:   SUBJECTIVE STATEMENT: Patient reports she did exercises before bed and it kept her up at night due to not being able to find a comfortable position.  EVAL: Patient sustained a fall 04/12/23 from the dog running into her. She was unable to bear weight right after the fall. She went to urgent care the following day and had an X-ray and was referred to Dr. Karie Schwalbe. She saw Dr. Karie Schwalbe a few days later and has been NWB in knee immobilizer since her visit with Dr. Karie Schwalbe due to tibial plateau fracture. She needs assistance getting up/down the stairs and getting dressed. She is not completing housework right now either.   PERTINENT HISTORY: Lt tibial plateau fracture 04/12/23 Per patient osteoporosis  PAIN:  Are you having pain? No   PRECAUTIONS: Other: see weightbearing restrictions  RED FLAGS: None   WEIGHT BEARING RESTRICTIONS: Yes WBAT with AD  FALLS:  Has patient fallen in last 6 months? Yes. Number of falls 1;dog ran into her and she fell  LIVING ENVIRONMENT: Lives with: lives with their family Lives  in: House/apartment Stairs: Yes: Internal: flight steps; can reach both Has following equipment at home: Single point cane, Quad cane small base, Walker - 2 wheeled, and Wheelchair (manual)  OCCUPATION: retired  PLOF: Independent  PATIENT GOALS: "be able to walk again"  NEXT MD VISIT: 05/25/23  OBJECTIVE:  Note: Objective measures were completed at Evaluation unless otherwise noted.  DIAGNOSTIC FINDINGS:  Lt knee MRI IMPRESSION: 1. Comminuted tibial plateau fracture without significant displacement or depression. CT suggested for more definitive evaluation of the fracture. 2. Extensive  horizontal cleavage-type tear involving the posterior horn and mid body region of the medial meniscus. 3. Intact ligamentous structures. 4. Large lipohemarthrosis.  PATIENT SURVEYS:  FOTO 31% function to 54% predicted  COGNITION: Overall cognitive status: Within functional limits for tasks assessed     SENSATION: Not tested  EDEMA:  Mild swelling about Lt knee    POSTURE: Lt knee in knee immobilizer   PALPATION: TTP Lt anterior knee   LOWER EXTREMITY ROM:  Passive ROM Right eval Left eval 05/20/23 Left  05/26/23 Left  12/19 Left  Hip flexion       Hip extension       Hip abduction       Hip adduction       Hip internal rotation       Hip external rotation       Knee flexion  50 80 AROM: 80  AROM 110  Knee extension  0     Ankle dorsiflexion       Ankle plantarflexion       Ankle inversion       Ankle eversion        (Blank rows = not tested)  LOWER EXTREMITY MMT:  MMT Right eval Left eval  Hip flexion    Hip extension    Hip abduction    Hip adduction    Hip internal rotation    Hip external rotation    Knee flexion  deferred  Knee extension  deferred  Ankle dorsiflexion    Ankle plantarflexion    Ankle inversion    Ankle eversion     (Blank rows = not tested)  LOWER EXTREMITY SPECIAL TESTS:  N/A  FUNCTIONAL TESTS:  N/A  GAIT: Distance walked: 10 ft  Assistive device utilized: Environmental consultant - 2 wheeled Level of assistance: Modified independence Comments: NWB LLE in knee immobilizer    [facility] Adult PT Treatment:                                                DATE: 06/04/2023 Therapeutic Exercise: Supine quad/hip flexor stretch --> leg off side of table x1 min AAROM --> AROM heel slides 10x10" Long sitting SLR --> parallel x12, ER x12 Side Lying: Straight leg hip abd x10 Small circles in hip abd x10 CW/CCW Hip extension in abd x10 Seated:  Hip add ball squeeze 10x5" LAQ + ball hug b/w knees 10x3" Knee flexion with slider  HS stretch with  strap Therapeutic Activity: Gait training with rollator --> focus on functional knee flexion & heel strike Stair navigation     [facility] Adult PT Treatment:  DATE: 06/02/2023 Therapeutic Exercise: Long sitting calf stretch Long sitting quad set 10x5" AAROM heel slides with strap 10x10" Long sitting SLR in ER 2x10 Side Lying: Straight leg hip abd x10 Hip extension in abd x10 Seated knee flexion AAROM with slider Supine HS & ITB stretches w/strap 2x30" each (L) Therapeutic Activity: Gait training with rollator --> focus on functional knee flexion & heel strike    [facility] Adult PT Treatment:                                                DATE: 05/28/2023 Therapeutic Exercise: Supine: AROM heel slides --> added strap to increase stretch 10x10" Quad set 10x5"  SAQ 2x10 SLR: parallel x10, ER x10 Seated AAROM heel slides (assist from R leg) Side Lying: Straight leg hip abd x10 Hip extension in abd x10 Therapeutic Activity: Gait training with rollator --> focus on functional knee flexion & heel strike Gait training with NBQC (uses cane getting in/out of bathroom) Standing weight shifting --> lateral & staggered stance Modalities: Vaso (L) knee, 34 deg, med compression x 10 min   PATIENT EDUCATION:  Education details: updated HEP   Person educated: Patient and Spouse Education method: Explanation, Demonstration, Tactile cues, and Verbal cues Education comprehension: verbalized understanding, returned demonstration, verbal cues required, tactile cues required, and needs further education  HOME EXERCISE PROGRAM: Access Code: [email or web address] URL: [email or web address] Date: 06/04/2023 Prepared by: Carlynn Herald  Exercises - Supine Heel Slide with Strap  - 1 x daily - 7 x weekly - 3 sets - 10 reps - Long Sitting Calf Stretch with Strap  - 1 x daily - 7 x weekly - 3 sets - 30 sec  hold - Sidelying Hip Abduction  - 1 x daily - 7  x weekly - 2 sets - 10 reps - Long Sitting Quad Set  - 2 x daily - 7 x weekly - 2 sets - 10 reps - 5 sec  hold - Supine Knee Extension Strengthening  - 2 x daily - 7 x weekly - 2 sets - 10 reps - Seated Knee Flexion AAROM  - 2 x daily - 7 x weekly - 1 sets - 10 reps - 5 sec  hold - Long Sitting Straight Leg Raise with External Rotation  - 1 x daily - 7 x weekly - 3 sets - 10 reps - 5 sec hold - Sidelying Hip Extension in Abduction  - 1 x daily - 7 x weekly - 3 sets - 10 reps - Hip Flexor Stretch at Edge of Bed  - 1 x daily - 7 x weekly - 1 sets - 1-3 reps - 30 sec - 1 min hold  ASSESSMENT:  CLINICAL IMPRESSION: Patient continues to be challenged with maintaining proper pelvic stability during straight leg raises in long sitting. Adductor strengthening incorporated with seated exercises to progress hip strengthening.   EVAL: Patient is a 67 y.o. female who was seen today for physical therapy evaluation and treatment for Lt tibial plateau fracture that she sustained from a fall on 04/12/23. She is currently NWB in a knee immobilizer on the LLE with instructions to remain NWB until MD f/u on 05/25/23. She has ROM precautions as she is allowed to complete PROM only at the knee for the next 2 weeks and can then begin AROM. She is noted to have full Lt  knee extension, but limited passive knee flexion. She will benefit from skilled PT to address ROM, strength, gait, and balance deficits per MD clearance in order to return to PLOF.    OBJECTIVE IMPAIRMENTS: Abnormal gait, decreased activity tolerance, decreased balance, decreased knowledge of condition, decreased mobility, difficulty walking, decreased ROM, decreased strength, increased edema, improper body mechanics, postural dysfunction, and pain.   ACTIVITY LIMITATIONS: carrying, lifting, bending, standing, squatting, sleeping, stairs, transfers, bed mobility, bathing, dressing, reach over head, hygiene/grooming, and locomotion level  PARTICIPATION  LIMITATIONS: meal prep, cleaning, laundry, driving, shopping, community activity, and yard work  PERSONAL FACTORS: Age, Fitness, Time since onset of injury/illness/exacerbation, and 3+ comorbidities: see PMH above  are also affecting patient's functional outcome.   REHAB POTENTIAL: Good  CLINICAL DECISION MAKING: Stable/uncomplicated  EVALUATION COMPLEXITY: Low   GOALS: Goals reviewed with patient? Yes  SHORT TERM GOALS: Target date: 06/09/2023  Patient will be independent and compliant with initial HEP.  Baseline: Goal status: INITIAL  2.  Patient will demonstrate at least 90 degrees of Lt knee AROM to improve ability to complete sit to stand transfer.  Baseline: 50 PROM Goal status: progressing   3.  Patient will ambulate with step through pattern with LRAD once cleared by MD for weight-bearing.  Baseline: NWB LLE  Goal status: INITIAL   LONG TERM GOALS: Target date: 07/11/23  Patient will score at least 54% on FOTO to signify clinically meaningful improvement in functional abilities.  Baseline: see above Goal status: INITIAL  2.  Patient will demonstrate 5/5 Lt knee strength to improve gait stability.  Baseline: deferred  Goal status: INITIAL  3.  Patient will demonstrate functional Lt knee AROM to improve gait mechanics.  Baseline: see above Goal status: INITIAL  4.  Patient will be able to ascend/descend stairs with reciprocal pattern with minimal/no Lt knee pain Baseline: NWB LLE Goal status: INITIAL  5.  Patient will be able to complete household tasks with minimal/no Lt knee pain Baseline: unable to cook, clean, etc.  Goal status: INITIAL  6.  Patient will be independent with advanced home program to progress/maintain current level of function.  Baseline: initial HEP issued  Goal status: INITIAL   PLAN:  PT FREQUENCY: 1-2x/week  PT DURATION: 8 weeks  PLANNED INTERVENTIONS: 97164- PT Re-evaluation, 97110-Therapeutic exercises, 97530- Therapeutic  activity, 97112- Neuromuscular re-education, 97535- Self Care, 95621- Manual therapy, [id number]- Gait training, [id number]- Aquatic Therapy, 97014- Electrical stimulation (unattended), [id number]- Electrical stimulation (manual), 97016- Vasopneumatic device, [id number]- Ionotophoresis 4mg /ml Dexamethasone, Dry Needling, Cryotherapy, and Moist heat  PLAN FOR NEXT SESSION: Gait training with AD; knee AAROM/AROM progression as tolerated. Quad & glute strengthening,    Carlynn Herald, PTA 06/04/23 10:20 AM

## 2023-06-08 ENCOUNTER — Ambulatory Visit: Payer: Medicare HMO

## 2023-06-08 DIAGNOSIS — M25662 Stiffness of left knee, not elsewhere classified: Secondary | ICD-10-CM | POA: Diagnosis not present

## 2023-06-08 DIAGNOSIS — R600 Localized edema: Secondary | ICD-10-CM | POA: Diagnosis not present

## 2023-06-08 DIAGNOSIS — R2689 Other abnormalities of gait and mobility: Secondary | ICD-10-CM | POA: Diagnosis not present

## 2023-06-08 DIAGNOSIS — M6281 Muscle weakness (generalized): Secondary | ICD-10-CM

## 2023-06-08 DIAGNOSIS — M25562 Pain in left knee: Secondary | ICD-10-CM

## 2023-06-08 NOTE — Therapy (Signed)
OUTPATIENT PHYSICAL THERAPY LOWER EXTREMITY TREATMENT   Patient Name: Diane Chavez MRN: [id number] DOB:07/31/1955, 67 y.o., female Today's Date: 06/08/2023  END OF SESSION:  PT End of Session - 06/08/23 0935     Visit Number 7    Number of Visits 17    Date for PT Re-Evaluation 07/11/23    Authorization Type 17 VISITS APPROVED FOR PT    Authorization Time Period 11/26-1/25/25    Authorization - Visit Number 7    Authorization - Number of Visits 17    PT Start Time 0933    PT Stop Time 1013    PT Time Calculation (min) 40 min    Activity Tolerance Patient tolerated treatment well    Behavior During Therapy [facility] for tasks assessed/performed            Past Medical History:  Diagnosis Date   Anxiety    Crohn disease (HCC)    Dr. Jacqulyn Bath   Depression    on prozac previously   Hyperlipidemia    Miscarriage    Ulcerative colitis (HCC)    Past Surgical History:  Procedure Laterality Date   CARDIAC SURGERY     as a child   EYE SURGERY  10/30/2009   repair of detached retina   TONSILLECTOMY  06/17/1963   TUBAL LIGATION  1986   Patient Active Problem List   Diagnosis Date Noted   Varicose vein of leg 09/30/2022   DDD (degenerative disc disease), cervical 08/22/2021   Osteoarthritis of right acromioclavicular joint 07/26/2021   Memory change 02/05/2021   Lumbar degenerative disc disease 10/04/2018   Acute shoulder bursitis, left 05/26/2018   Tibial plateau fracture, left 09/30/2017   Status post cardiac surgery 09/05/2013   Epiretinal membrane 12/10/2012   Osteoporosis 10/10/2010   BENIGN POSITIONAL VERTIGO 04/05/2010   Regional enteritis (HCC) 07/30/2009   Headache 07/30/2009   Asymptomatic postmenopausal status 04/25/2009   Anxiety state 09/20/2007   Insomnia 09/20/2007    PCP: Agapito Games, MD   REFERRING PROVIDER: Monica Becton, MD   REFERRING DIAG: S82.142D (ICD-10-CM) - Closed fracture of left tibial plateau with routine healing,  subsequent encounter   THERAPY DIAG:  Acute pain of left knee  Localized edema  Other abnormalities of gait and mobility  Muscle weakness (generalized)  Stiffness of left knee, not elsewhere classified  Rationale for Evaluation and Treatment: Rehabilitation  ONSET DATE: 04/12/23  SUBJECTIVE:   SUBJECTIVE STATEMENT: Patient reports is still having a hard time finding a comfortable position to sleep in, states she always wakes-up at least once during the night. Patient states she only has knee pain at night.   EVAL: Patient sustained a fall 04/12/23 from the dog running into her. She was unable to bear weight right after the fall. She went to urgent care the following day and had an X-ray and was referred to Dr. Karie Schwalbe. She saw Dr. Karie Schwalbe a few days later and has been NWB in knee immobilizer since her visit with Dr. Karie Schwalbe due to tibial plateau fracture. She needs assistance getting up/down the stairs and getting dressed. She is not completing housework right now either.   PERTINENT HISTORY: Lt tibial plateau fracture 04/12/23 Per patient osteoporosis  PAIN:  Are you having pain? No   PRECAUTIONS: Other: see weightbearing restrictions  RED FLAGS: None   WEIGHT BEARING RESTRICTIONS: Yes WBAT with AD  FALLS:  Has patient fallen in last 6 months? Yes. Number of falls 1;dog ran into her and she  fell  LIVING ENVIRONMENT: Lives with: lives with their family Lives in: House/apartment Stairs: Yes: Internal: flight steps; can reach both Has following equipment at home: Single point cane, Quad cane small base, Walker - 2 wheeled, and Wheelchair (manual)  OCCUPATION: retired  PLOF: Independent  PATIENT GOALS: "be able to walk again"  NEXT MD VISIT: 06/22/23  OBJECTIVE:  Note: Objective measures were completed at Evaluation unless otherwise noted.  DIAGNOSTIC FINDINGS:  Lt knee MRI IMPRESSION: 1. Comminuted tibial plateau fracture without significant displacement or depression. CT  suggested for more definitive evaluation of the fracture. 2. Extensive horizontal cleavage-type tear involving the posterior horn and mid body region of the medial meniscus. 3. Intact ligamentous structures. 4. Large lipohemarthrosis.  PATIENT SURVEYS:  FOTO 31% function to 54% predicted  COGNITION: Overall cognitive status: Within functional limits for tasks assessed     SENSATION: Not tested  EDEMA:  Mild swelling about Lt knee    POSTURE: Lt knee in knee immobilizer   PALPATION: TTP Lt anterior knee   LOWER EXTREMITY ROM:  Passive ROM Right eval Left eval 05/20/23 Left  05/26/23 Left  12/19 Left  Hip flexion       Hip extension       Hip abduction       Hip adduction       Hip internal rotation       Hip external rotation       Knee flexion  50 80 AROM: 80  AROM 110  Knee extension  0     Ankle dorsiflexion       Ankle plantarflexion       Ankle inversion       Ankle eversion        (Blank rows = not tested)  LOWER EXTREMITY MMT:  MMT Right eval Left eval  Hip flexion    Hip extension    Hip abduction    Hip adduction    Hip internal rotation    Hip external rotation    Knee flexion  deferred  Knee extension  deferred  Ankle dorsiflexion    Ankle plantarflexion    Ankle inversion    Ankle eversion     (Blank rows = not tested)  LOWER EXTREMITY SPECIAL TESTS:  N/A  FUNCTIONAL TESTS:  N/A  GAIT: Distance walked: 10 ft  Assistive device utilized: Environmental consultant - 2 wheeled Level of assistance: Modified independence Comments: NWB LLE in knee immobilizer    [facility] Adult PT Treatment:                                                DATE: 06/08/2023 Therapeutic Exercise: Supine: AROM heel slides x10 SLR --> parallel x10, ER x10 Ankle ABCs (long-sitting) Seated HS stretch with strap 2x30" Standing (L) hip abd 2x10 Standing (L) hip ext 2x10 Seated LAQ 2x10x5" Therapeutic Activity: Gait training with rollator --> focus on functional knee flexion  & heel strike Gait training with NBQC --> SPC    [facility] Adult PT Treatment:                                                DATE: 06/04/2023 Therapeutic Exercise: Supine quad/hip flexor stretch --> leg off side of  table x1 min AAROM --> AROM heel slides 10x10" Long sitting SLR --> parallel x12, ER x12 Side Lying: Straight leg hip abd x10 Small circles in hip abd x10 CW/CCW Hip extension in abd x10 Seated:  Hip add ball squeeze 10x5" LAQ + ball hug b/w knees 10x3" Knee flexion with slider  HS stretch with strap Therapeutic Activity: Gait training with rollator --> focus on functional knee flexion & heel strike Stair navigation   [facility] Adult PT Treatment:                                                DATE: 06/02/2023 Therapeutic Exercise: Long sitting calf stretch Long sitting quad set 10x5" AAROM heel slides with strap 10x10" Long sitting SLR in ER 2x10 Side Lying: Straight leg hip abd x10 Hip extension in abd x10 Seated knee flexion AAROM with slider Supine HS & ITB stretches w/strap 2x30" each (L) Therapeutic Activity: Gait training with rollator --> focus on functional knee flexion & heel strike   PATIENT EDUCATION:  Education details: updated HEP   Person educated: Patient and Spouse Education method: Explanation, Demonstration, Tactile cues, and Verbal cues Education comprehension: verbalized understanding, returned demonstration, verbal cues required, tactile cues required, and needs further education  HOME EXERCISE PROGRAM: Access Code: [email or web address] URL: [email or web address] Date: 06/08/2023 Prepared by: Carlynn Herald  Program Notes Walking 2x day without knee brace:Start with rollator --> progress to [facility]  Exercises - Supine Heel Slide with Strap  - 1 x daily - 7 x weekly - 3 sets - 10 reps - Long Sitting Calf Stretch with Strap  - 1 x daily - 7 x weekly - 3 sets - 30 sec  hold - Long Sitting Quad Set  - 2 x daily - 7 x weekly - 2 sets - 10 reps  - 5 sec  hold - Supine Knee Extension Strengthening  - 2 x daily - 7 x weekly - 2 sets - 10 reps - Seated Knee Flexion AAROM  - 2 x daily - 7 x weekly - 1 sets - 10 reps - 5 sec  hold - Long Sitting Straight Leg Raise with External Rotation  - 1 x daily - 7 x weekly - 3 sets - 10 reps - 5 sec hold - Hip Flexor Stretch at Edge of Bed  - 1 x daily - 7 x weekly - 1 sets - 1-3 reps - 30 sec - 1 min hold - Standing Hip Abduction with Counter Support  - 1 x daily - 7 x weekly - 3 sets - 10 reps - Standing Hip Extension with Counter Support  - 1 x daily - 7 x weekly - 3 sets - 10 reps - Seated Long Arc Quad  - 1 x daily - 7 x weekly - 3 sets - 10 reps - 5 sec hold   ASSESSMENT:  CLINICAL IMPRESSION: Gait training progressed with NBQC; patient demonstrated good gait mechanics and able to progress to [facility]. Cueing provided to improve glute and quad activation during weight bearing on standing leg. Hip strengthening exercises modified to standing for increased comfort with position.  EVAL: Patient is a 67 y.o. female who was seen today for physical therapy evaluation and treatment for Lt tibial plateau fracture that she sustained from a fall on 04/12/23. She is currently NWB in a knee immobilizer on the LLE  with instructions to remain NWB until MD f/u on 05/25/23. She has ROM precautions as she is allowed to complete PROM only at the knee for the next 2 weeks and can then begin AROM. She is noted to have full Lt knee extension, but limited passive knee flexion. She will benefit from skilled PT to address ROM, strength, gait, and balance deficits per MD clearance in order to return to PLOF.    OBJECTIVE IMPAIRMENTS: Abnormal gait, decreased activity tolerance, decreased balance, decreased knowledge of condition, decreased mobility, difficulty walking, decreased ROM, decreased strength, increased edema, improper body mechanics, postural dysfunction, and pain.   ACTIVITY LIMITATIONS: carrying, lifting, bending,  standing, squatting, sleeping, stairs, transfers, bed mobility, bathing, dressing, reach over head, hygiene/grooming, and locomotion level  PARTICIPATION LIMITATIONS: meal prep, cleaning, laundry, driving, shopping, community activity, and yard work  PERSONAL FACTORS: Age, Fitness, Time since onset of injury/illness/exacerbation, and 3+ comorbidities: see PMH above  are also affecting patient's functional outcome.   REHAB POTENTIAL: Good  CLINICAL DECISION MAKING: Stable/uncomplicated  EVALUATION COMPLEXITY: Low   GOALS: Goals reviewed with patient? Yes  SHORT TERM GOALS: Target date: 06/09/2023  Patient will be independent and compliant with initial HEP.  Baseline: some exercises "too challenging" Goal status: IN PROGRESS  2.  Patient will demonstrate at least 90 degrees of Lt knee AROM to improve ability to complete sit to stand transfer.  Baseline: 105 AROM Goal status: MET   3.  Patient will ambulate with step through pattern with LRAD once cleared by MD for weight-bearing.  Baseline:  Goal status: MET   LONG TERM GOALS: Target date: 07/11/23  Patient will score at least 54% on FOTO to signify clinically meaningful improvement in functional abilities.  Baseline: see above Goal status: INITIAL  2.  Patient will demonstrate 5/5 Lt knee strength to improve gait stability.  Baseline: deferred  Goal status: INITIAL  3.  Patient will demonstrate functional Lt knee AROM to improve gait mechanics.  Baseline: see above Goal status: INITIAL  4.  Patient will be able to ascend/descend stairs with reciprocal pattern with minimal/no Lt knee pain Baseline: NWB LLE Goal status: INITIAL  5.  Patient will be able to complete household tasks with minimal/no Lt knee pain Baseline: unable to cook, clean, etc.  Goal status: INITIAL  6.  Patient will be independent with advanced home program to progress/maintain current level of function.  Baseline: initial HEP issued  Goal  status: INITIAL   PLAN:  PT FREQUENCY: 1-2x/week  PT DURATION: 8 weeks  PLANNED INTERVENTIONS: 97164- PT Re-evaluation, 97110-Therapeutic exercises, 97530- Therapeutic activity, 97112- Neuromuscular re-education, 97535- Self Care, [id number]- Manual therapy, [id number]- Gait training, [id number]- Aquatic Therapy, 97014- Electrical stimulation (unattended), [id number]- Electrical stimulation (manual), 97016- Vasopneumatic device, [id number]- Ionotophoresis 4mg /ml Dexamethasone, Dry Needling, Cryotherapy, and Moist heat  PLAN FOR NEXT SESSION: Gait training with SPC; knee AAROM/AROM progression as tolerated. Quad & glute strengthening,    Carlynn Herald, PTA 06/08/23 10:16 AM

## 2023-06-11 ENCOUNTER — Ambulatory Visit: Payer: Medicare HMO

## 2023-06-11 DIAGNOSIS — R600 Localized edema: Secondary | ICD-10-CM

## 2023-06-11 DIAGNOSIS — M25662 Stiffness of left knee, not elsewhere classified: Secondary | ICD-10-CM | POA: Diagnosis not present

## 2023-06-11 DIAGNOSIS — M25562 Pain in left knee: Secondary | ICD-10-CM | POA: Diagnosis not present

## 2023-06-11 DIAGNOSIS — R2689 Other abnormalities of gait and mobility: Secondary | ICD-10-CM | POA: Diagnosis not present

## 2023-06-11 DIAGNOSIS — M6281 Muscle weakness (generalized): Secondary | ICD-10-CM | POA: Diagnosis not present

## 2023-06-11 NOTE — Therapy (Signed)
OUTPATIENT PHYSICAL THERAPY LOWER EXTREMITY TREATMENT   Patient Name: NATASA FEDEROFF MRN: [id number] DOB:04-16-1956, 67 y.o., female Today's Date: 06/11/2023  END OF SESSION:  PT End of Session - 06/11/23 1016     Visit Number 8    Number of Visits 17    Date for PT Re-Evaluation 07/11/23    Authorization Type 17 VISITS APPROVED FOR PT    Authorization Time Period 11/26-1/25/25    Authorization - Visit Number 8    Authorization - Number of Visits 17    PT Start Time 1017    PT Stop Time 1108    PT Time Calculation (min) 51 min    Activity Tolerance Patient tolerated treatment well    Behavior During Therapy WFL for tasks assessed/performed            Past Medical History:  Diagnosis Date   Anxiety    Crohn disease (HCC)    Dr. Jacqulyn Bath   Depression    on prozac previously   Hyperlipidemia    Miscarriage    Ulcerative colitis (HCC)    Past Surgical History:  Procedure Laterality Date   CARDIAC SURGERY     as a child   EYE SURGERY  10/30/2009   repair of detached retina   TONSILLECTOMY  06/17/1963   TUBAL LIGATION  1986   Patient Active Problem List   Diagnosis Date Noted   Varicose vein of leg 09/30/2022   DDD (degenerative disc disease), cervical 08/22/2021   Osteoarthritis of right acromioclavicular joint 07/26/2021   Memory change 02/05/2021   Lumbar degenerative disc disease 10/04/2018   Acute shoulder bursitis, left 05/26/2018   Tibial plateau fracture, left 09/30/2017   Status post cardiac surgery 09/05/2013   Epiretinal membrane 12/10/2012   Osteoporosis 10/10/2010   BENIGN POSITIONAL VERTIGO 04/05/2010   Regional enteritis (HCC) 07/30/2009   Headache 07/30/2009   Asymptomatic postmenopausal status 04/25/2009   Anxiety state 09/20/2007   Insomnia 09/20/2007    PCP: Agapito Games, MD   REFERRING PROVIDER: Monica Becton, MD   REFERRING DIAG: S82.142D (ICD-10-CM) - Closed fracture of left tibial plateau with routine healing,  subsequent encounter   THERAPY DIAG:  Acute pain of left knee  Localized edema  Other abnormalities of gait and mobility  Muscle weakness (generalized)  Stiffness of left knee, not elsewhere classified  Rationale for Evaluation and Treatment: Rehabilitation  ONSET DATE: 04/12/23  SUBJECTIVE:   SUBJECTIVE STATEMENT: Patient reports she has been walking more with SPC at home but uses rollater when out in community.  EVAL: Patient sustained a fall 04/12/23 from the dog running into her. She was unable to bear weight right after the fall. She went to urgent care the following day and had an X-ray and was referred to Dr. Karie Schwalbe. She saw Dr. Karie Schwalbe a few days later and has been NWB in knee immobilizer since her visit with Dr. Karie Schwalbe due to tibial plateau fracture. She needs assistance getting up/down the stairs and getting dressed. She is not completing housework right now either.   PERTINENT HISTORY: Lt tibial plateau fracture 04/12/23 Per patient osteoporosis  PAIN:  Are you having pain? No   PRECAUTIONS: Other: see weightbearing restrictions  RED FLAGS: None   WEIGHT BEARING RESTRICTIONS: Yes WBAT with AD  FALLS:  Has patient fallen in last 6 months? Yes. Number of falls 1;dog ran into her and she fell  LIVING ENVIRONMENT: Lives with: lives with their family Lives in: House/apartment Stairs: Yes: Internal: flight  steps; can reach both Has following equipment at home: Single point cane, Quad cane small base, Walker - 2 wheeled, and Wheelchair (manual)  OCCUPATION: retired  PLOF: Independent  PATIENT GOALS: "be able to walk again"  NEXT MD VISIT: 06/22/23  OBJECTIVE:  Note: Objective measures were completed at Evaluation unless otherwise noted.  DIAGNOSTIC FINDINGS:  Lt knee MRI IMPRESSION: 1. Comminuted tibial plateau fracture without significant displacement or depression. CT suggested for more definitive evaluation of the fracture. 2. Extensive horizontal cleavage-type  tear involving the posterior horn and mid body region of the medial meniscus. 3. Intact ligamentous structures. 4. Large lipohemarthrosis.  PATIENT SURVEYS:  FOTO 31% function to 54% predicted  COGNITION: Overall cognitive status: Within functional limits for tasks assessed     SENSATION: Not tested  EDEMA:  Mild swelling about Lt knee    POSTURE: Lt knee in knee immobilizer   PALPATION: TTP Lt anterior knee   LOWER EXTREMITY ROM:  Passive ROM Right eval Left eval 05/20/23 Left  05/26/23 Left  12/19 Left 12/26 Left  Hip flexion        Hip extension        Hip abduction        Hip adduction        Hip internal rotation        Hip external rotation        Knee flexion  50 80 AROM: 80  AROM 110 AROM 115  Knee extension  0      Ankle dorsiflexion        Ankle plantarflexion        Ankle inversion        Ankle eversion         (Blank rows = not tested)  LOWER EXTREMITY MMT:  MMT Right eval Left eval  Hip flexion    Hip extension    Hip abduction    Hip adduction    Hip internal rotation    Hip external rotation    Knee flexion  deferred  Knee extension  deferred  Ankle dorsiflexion    Ankle plantarflexion    Ankle inversion    Ankle eversion     (Blank rows = not tested)  LOWER EXTREMITY SPECIAL TESTS:  N/A  FUNCTIONAL TESTS:  N/A  GAIT: Distance walked: 10 ft  Assistive device utilized: Environmental consultant - 2 wheeled Level of assistance: Modified independence Comments: NWB LLE in knee immobilizer    [facility] Adult PT Treatment:                                                DATE: 06/11/2023 Therapeutic Exercise: Runner's lunge gastroc stretch 2x30" Seated: Hip add iso ball squeeze 10x5" Bilateral clamshells + blue TB 2x10  Unilateral clamshells + blue TB x10 (B) LAQ + ball b/w knees x15 Long sitting HS stretch 3x30" Supine: AROM heel slides x20 SLR --> parallel & ER 2x10 each Therapeutic Activity: Gait training with rollator --> SPC; focus on  functional knee flexion & heel strike Stair training with 2 railings --> SPC & 1 railing    [facility] Adult PT Treatment:  DATE: 06/08/2023 Therapeutic Exercise: Supine: AROM heel slides x10 SLR --> parallel x10, ER x10 Ankle ABCs (long-sitting) Seated HS stretch with strap 2x30" Standing (L) hip abd 2x10 Standing (L) hip ext 2x10 Seated LAQ 2x10x5" Therapeutic Activity: Gait training with rollator --> focus on functional knee flexion & heel strike Gait training with NBQC --> SPC    [facility] Adult PT Treatment:                                                DATE: 06/04/2023 Therapeutic Exercise: Supine quad/hip flexor stretch --> leg off side of table x1 min AAROM --> AROM heel slides 10x10" Long sitting SLR --> parallel x12, ER x12 Side Lying: Straight leg hip abd x10 Small circles in hip abd x10 CW/CCW Hip extension in abd x10 Seated:  Hip add ball squeeze 10x5" LAQ + ball hug b/w knees 10x3" Knee flexion with slider  HS stretch with strap Therapeutic Activity: Gait training with rollator --> focus on functional knee flexion & heel strike Stair navigation   PATIENT EDUCATION:  Education details: updated HEP   Person educated: Patient and Spouse Education method: Explanation, Demonstration, Actor cues, and Verbal cues Education comprehension: verbalized understanding, returned demonstration, verbal cues required, tactile cues required, and needs further education  HOME EXERCISE PROGRAM: Access Code: [email or web address] URL: [email or web address] Date: 06/08/2023 Prepared by: Carlynn Herald  Program Notes Walking 2x day without knee brace:Start with rollator --> progress to [facility]  Exercises - Supine Heel Slide with Strap  - 1 x daily - 7 x weekly - 3 sets - 10 reps - Long Sitting Calf Stretch with Strap  - 1 x daily - 7 x weekly - 3 sets - 30 sec  hold - Long Sitting Quad Set  - 2 x daily - 7 x weekly - 2 sets - 10 reps  - 5 sec  hold - Supine Knee Extension Strengthening  - 2 x daily - 7 x weekly - 2 sets - 10 reps - Seated Knee Flexion AAROM  - 2 x daily - 7 x weekly - 1 sets - 10 reps - 5 sec  hold - Long Sitting Straight Leg Raise with External Rotation  - 1 x daily - 7 x weekly - 3 sets - 10 reps - 5 sec hold - Hip Flexor Stretch at Edge of Bed  - 1 x daily - 7 x weekly - 1 sets - 1-3 reps - 30 sec - 1 min hold - Standing Hip Abduction with Counter Support  - 1 x daily - 7 x weekly - 3 sets - 10 reps - Standing Hip Extension with Counter Support  - 1 x daily - 7 x weekly - 3 sets - 10 reps - Seated Long Arc Quad  - 1 x daily - 7 x weekly - 3 sets - 10 reps - 5 sec hold   ASSESSMENT:  CLINICAL IMPRESSION: Gait training continued with SPC; discussion with patient on using SPC during stair navigation with one railing. Updated patient's husband on patient's progress and current status with stair navigation with SPC and walking intervals without knee brace. Patient demonstrated good progression with knee flexion AROM and functional knee flexion during walking.  EVAL: Patient is a 67 y.o. female who was seen today for physical therapy evaluation and treatment for Lt tibial plateau fracture that she sustained from  a fall on 04/12/23. She is currently NWB in a knee immobilizer on the LLE with instructions to remain NWB until MD f/u on 05/25/23. She has ROM precautions as she is allowed to complete PROM only at the knee for the next 2 weeks and can then begin AROM. She is noted to have full Lt knee extension, but limited passive knee flexion. She will benefit from skilled PT to address ROM, strength, gait, and balance deficits per MD clearance in order to return to PLOF.    OBJECTIVE IMPAIRMENTS: Abnormal gait, decreased activity tolerance, decreased balance, decreased knowledge of condition, decreased mobility, difficulty walking, decreased ROM, decreased strength, increased edema, improper body mechanics, postural  dysfunction, and pain.   ACTIVITY LIMITATIONS: carrying, lifting, bending, standing, squatting, sleeping, stairs, transfers, bed mobility, bathing, dressing, reach over head, hygiene/grooming, and locomotion level  PARTICIPATION LIMITATIONS: meal prep, cleaning, laundry, driving, shopping, community activity, and yard work  PERSONAL FACTORS: Age, Fitness, Time since onset of injury/illness/exacerbation, and 3+ comorbidities: see PMH above  are also affecting patient's functional outcome.   REHAB POTENTIAL: Good  CLINICAL DECISION MAKING: Stable/uncomplicated  EVALUATION COMPLEXITY: Low   GOALS: Goals reviewed with patient? Yes  SHORT TERM GOALS: Target date: 06/09/2023  Patient will be independent and compliant with initial HEP.  Baseline:  Goal status: MET  2.  Patient will demonstrate at least 90 degrees of Lt knee AROM to improve ability to complete sit to stand transfer.  Baseline: 105 AROM Goal status: MET   3.  Patient will ambulate with step through pattern with LRAD once cleared by MD for weight-bearing.  Baseline:  Goal status: MET   LONG TERM GOALS: Target date: 07/11/23  Patient will score at least 54% on FOTO to signify clinically meaningful improvement in functional abilities.  Baseline:50% Goal status: IN PROGRESS  2.  Patient will demonstrate 5/5 Lt knee strength to improve gait stability.  Baseline: deferred  Goal status: INITIAL  3.  Patient will demonstrate functional Lt knee AROM to improve gait mechanics.  Baseline: see above Goal status: INITIAL  4.  Patient will be able to ascend/descend stairs with reciprocal pattern with minimal/no Lt knee pain Baseline: step together Goal status: IN PROGRESS  5.  Patient will be able to complete household tasks with minimal/no Lt knee pain Baseline:  Goal status: MET  6.  Patient will be independent with advanced home program to progress/maintain current level of function.  Baseline:  Goal status: IN  PROGRESS   PLAN:  PT FREQUENCY: 1-2x/week  PT DURATION: 8 weeks  PLANNED INTERVENTIONS: 97164- PT Re-evaluation, 97110-Therapeutic exercises, 97530- Therapeutic activity, 97112- Neuromuscular re-education, 97535- Self Care, 09811- Manual therapy, [id number]- Gait training, [id number]- Aquatic Therapy, 97014- Electrical stimulation (unattended), [id number]- Electrical stimulation (manual), 97016- Vasopneumatic device, [id number]- Ionotophoresis 4mg /ml Dexamethasone, Dry Needling, Cryotherapy, and Moist heat  PLAN FOR NEXT SESSION: Gait & stair training (long hallway) with SPC; knee AAROM/AROM progression as tolerated. Quad & glute strengthening,    Carlynn Herald, PTA 06/11/23 11:17 AM

## 2023-06-16 ENCOUNTER — Ambulatory Visit: Payer: Medicare HMO

## 2023-06-16 DIAGNOSIS — M6281 Muscle weakness (generalized): Secondary | ICD-10-CM | POA: Diagnosis not present

## 2023-06-16 DIAGNOSIS — M25662 Stiffness of left knee, not elsewhere classified: Secondary | ICD-10-CM

## 2023-06-16 DIAGNOSIS — M25562 Pain in left knee: Secondary | ICD-10-CM

## 2023-06-16 DIAGNOSIS — R600 Localized edema: Secondary | ICD-10-CM | POA: Diagnosis not present

## 2023-06-16 DIAGNOSIS — R2689 Other abnormalities of gait and mobility: Secondary | ICD-10-CM | POA: Diagnosis not present

## 2023-06-16 NOTE — Therapy (Signed)
 OUTPATIENT PHYSICAL THERAPY LOWER EXTREMITY TREATMENT   Patient Name: Diane Chavez MRN: [id number] DOB:1955-09-28, 67 y.o., female Today's Date: 06/16/2023  END OF SESSION:  PT End of Session - 06/16/23 0935     Visit Number 9    Number of Visits 17    Date for PT Re-Evaluation 07/11/23    Authorization Type 17 VISITS APPROVED FOR PT    Authorization Time Period 11/26-1/25/25    Authorization - Visit Number 9    Authorization - Number of Visits 17    PT Start Time 0934    PT Stop Time 1015    PT Time Calculation (min) 41 min    Activity Tolerance Patient tolerated treatment well    Behavior During Therapy [facility] for tasks assessed/performed             Past Medical History:  Diagnosis Date   Anxiety    Crohn disease (HCC)    Dr. Darra   Depression    on prozac previously   Hyperlipidemia    Miscarriage    Ulcerative colitis (HCC)    Past Surgical History:  Procedure Laterality Date   CARDIAC SURGERY     as a child   EYE SURGERY  10/30/2009   repair of detached retina   TONSILLECTOMY  06/17/1963   TUBAL LIGATION  1986   Patient Active Problem List   Diagnosis Date Noted   Varicose vein of leg 09/30/2022   DDD (degenerative disc disease), cervical 08/22/2021   Osteoarthritis of right acromioclavicular joint 07/26/2021   Memory change 02/05/2021   Lumbar degenerative disc disease 10/04/2018   Acute shoulder bursitis, left 05/26/2018   Tibial plateau fracture, left 09/30/2017   Status post cardiac surgery 09/05/2013   Epiretinal membrane 12/10/2012   Osteoporosis 10/10/2010   BENIGN POSITIONAL VERTIGO 04/05/2010   Regional enteritis (HCC) 07/30/2009   Headache 07/30/2009   Asymptomatic postmenopausal status 04/25/2009   Anxiety state 09/20/2007   Insomnia 09/20/2007    PCP: Alvan Dorothyann BIRCH, MD   REFERRING PROVIDER: Curtis Debby PARAS, MD   REFERRING DIAG: S82.142D (ICD-10-CM) - Closed fracture of left tibial plateau with routine healing,  subsequent encounter   THERAPY DIAG:  Acute pain of left knee  Localized edema  Other abnormalities of gait and mobility  Muscle weakness (generalized)  Stiffness of left knee, not elsewhere classified  Rationale for Evaluation and Treatment: Rehabilitation  ONSET DATE: 04/12/23  SUBJECTIVE:   SUBJECTIVE STATEMENT: Patient reports mostly stiffness in the knee now and not really pain. Is using her cane more.   EVAL: Patient sustained a fall 04/12/23 from the dog running into her. She was unable to bear weight right after the fall. She went to urgent care the following day and had an X-ray and was referred to Dr. ONEIDA. She saw Dr. ONEIDA a few days later and has been NWB in knee immobilizer since her visit with Dr. ONEIDA due to tibial plateau fracture. She needs assistance getting up/down the stairs and getting dressed. She is not completing housework right now either.   PERTINENT HISTORY: Lt tibial plateau fracture 04/12/23 Per patient osteoporosis  PAIN:  Are you having pain? No   PRECAUTIONS: Other: see weightbearing restrictions  RED FLAGS: None   WEIGHT BEARING RESTRICTIONS: Yes WBAT LLE  FALLS:  Has patient fallen in last 6 months? Yes. Number of falls 1;dog ran into her and she fell  LIVING ENVIRONMENT: Lives with: lives with their family Lives in: House/apartment Stairs: Yes: Internal: flight  steps; can reach both Has following equipment at home: Single point cane, Quad cane small base, Walker - 2 wheeled, and Wheelchair (manual)  OCCUPATION: retired  PLOF: Independent  PATIENT GOALS: be able to walk again  NEXT MD VISIT: 06/22/23  OBJECTIVE:  Note: Objective measures were completed at Evaluation unless otherwise noted.  DIAGNOSTIC FINDINGS:  Lt knee MRI IMPRESSION: 1. Comminuted tibial plateau fracture without significant displacement or depression. CT suggested for more definitive evaluation of the fracture. 2. Extensive horizontal cleavage-type tear  involving the posterior horn and mid body region of the medial meniscus. 3. Intact ligamentous structures. 4. Large lipohemarthrosis.  PATIENT SURVEYS:  FOTO 31% function to 54% predicted  COGNITION: Overall cognitive status: Within functional limits for tasks assessed     SENSATION: Not tested  EDEMA:  Mild swelling about Lt knee    POSTURE: Lt knee in knee immobilizer   PALPATION: TTP Lt anterior knee   LOWER EXTREMITY ROM:  Passive ROM Right eval Left eval 05/20/23 Left  05/26/23 Left  12/19 Left 12/26 Left 06/16/23 Left   Hip flexion         Hip extension         Hip abduction         Hip adduction         Hip internal rotation         Hip external rotation         Knee flexion  50 80 AROM: 80  AROM 110 AROM 115 115 AROM  Knee extension  0       Ankle dorsiflexion         Ankle plantarflexion         Ankle inversion         Ankle eversion          (Blank rows = not tested)  LOWER EXTREMITY MMT:  MMT Right eval Left eval  Hip flexion    Hip extension    Hip abduction    Hip adduction    Hip internal rotation    Hip external rotation    Knee flexion  deferred  Knee extension  deferred  Ankle dorsiflexion    Ankle plantarflexion    Ankle inversion    Ankle eversion     (Blank rows = not tested)  LOWER EXTREMITY SPECIAL TESTS:  N/A  FUNCTIONAL TESTS:  N/A  GAIT: Distance walked: 10 ft  Assistive device utilized: Environmental Consultant - 2 wheeled Level of assistance: Modified independence Comments: NWB LLE in knee immobilizer   [facility] Adult PT Treatment:                                                DATE: 06/16/23 Therapeutic Exercise: Recumbent bike no resistance x 5 minutes  Calf stretch on wedge x 1 minute SLR x 10  Standing calf raise 2 x 10  Standing toe raises 2 x 10  Calf stretch at wall x 30 sec  TKE blue band 2 x 10  Updated HEP   Therapeutic Activity: Stair negotiation step to descent; reciprocal ascent; 1 flight  Gait  training: Working on heel strike and knee extension at initial contact and push-off with great toe on the LLE in long hallway of med center x 2 without AD     [facility] Adult PT Treatment:  DATE: 06/11/2023 Therapeutic Exercise: Runner's lunge gastroc stretch 2x30 Seated: Hip add iso ball squeeze 10x5 Bilateral clamshells + blue TB 2x10  Unilateral clamshells + blue TB x10 (B) LAQ + ball b/w knees x15 Long sitting HS stretch 3x30 Supine: AROM heel slides x20 SLR --> parallel & ER 2x10 each Therapeutic Activity: Gait training with rollator --> SPC; focus on functional knee flexion & heel strike Stair training with 2 railings --> SPC & 1 railing    [facility] Adult PT Treatment:                                                DATE: 06/08/2023 Therapeutic Exercise: Supine: AROM heel slides x10 SLR --> parallel x10, ER x10 Ankle ABCs (long-sitting) Seated HS stretch with strap 2x30 Standing (L) hip abd 2x10 Standing (L) hip ext 2x10 Seated LAQ 2x10x5 Therapeutic Activity: Gait training with rollator --> focus on functional knee flexion & heel strike Gait training with NBQC --> SPC    [facility] Adult PT Treatment:                                                DATE: 06/04/2023 Therapeutic Exercise: Supine quad/hip flexor stretch --> leg off side of table x1 min AAROM --> AROM heel slides 10x10 Long sitting SLR --> parallel x12, ER x12 Side Lying: Straight leg hip abd x10 Small circles in hip abd x10 CW/CCW Hip extension in abd x10 Seated:  Hip add ball squeeze 10x5 LAQ + ball hug b/w knees 10x3 Knee flexion with slider  HS stretch with strap Therapeutic Activity: Gait training with rollator --> focus on functional knee flexion & heel strike Stair navigation   PATIENT EDUCATION:  Education details: updated HEP   Person educated: Patient and Spouse Education method: Explanation, Demonstration, Actor cues, and Verbal  cues Education comprehension: verbalized understanding, returned demonstration, verbal cues required, tactile cues required, and needs further education  HOME EXERCISE PROGRAM: Access Code: [email or web address] URL: [email or web address] Date: 06/16/2023 Prepared by: Lucie Meeter  Program Notes Walking 2x day without knee brace:Start with rollator --> progress to [facility]  Exercises - Supine Heel Slide with Strap  - 1 x daily - 7 x weekly - 3 sets - 10 reps - Supine Active Straight Leg Raise  - 1 x daily - 7 x weekly - 2 sets - 10 reps - Seated Knee Flexion AAROM  - 1 x daily - 7 x weekly - 1 sets - 10 reps - 5 sec  hold - Hip Flexor Stretch at Edge of Bed  - 1 x daily - 7 x weekly - 1 sets - 1-3 reps - 30 sec - 1 min hold - Gastroc Stretch on Wall  - 1 x daily - 7 x weekly - 3 sets - 30 sec  hold - Standing Hip Abduction with Counter Support  - 1 x daily - 7 x weekly - 3 sets - 10 reps - Standing Hip Extension with Counter Support  - 1 x daily - 7 x weekly - 3 sets - 10 reps - Seated Long Arc Quad  - 1 x daily - 7 x weekly - 3 sets - 10 reps - 5 sec hold - Standing Heel Raises  -  1 x daily - 7 x weekly - 2 sets - 10 reps - Standing Toe Raises at Chair  - 1 x daily - 7 x weekly - 2 sets - 10 reps - Standing Terminal Knee Extension with Resistance  - 1 x daily - 7 x weekly - 2 sets - 10 reps   ASSESSMENT:  CLINICAL IMPRESSION: Per Dr. ONEIDA patient is cleared to transition away from an AD. We worked on investment banker, operational without an AD focusing on improving heel strike/knee extension at initial contact and allowing for push-off from the forefoot on the LLE. No reports of knee pain with gait training, but did report discomfort in her ankle. Was encouraged to walk short distances in her home without AD, but to utilize [facility] for community/long distance ambulation. Able to reciprocally complete stair ascent, but due to weakness is able to complete step to descent. She reported improvement in knee stiffness  at conclusion of session.   EVAL: Patient is a 67 y.o. female who was seen today for physical therapy evaluation and treatment for Lt tibial plateau fracture that she sustained from a fall on 04/12/23. She is currently NWB in a knee immobilizer on the LLE with instructions to remain NWB until MD f/u on 05/25/23. She has ROM precautions as she is allowed to complete PROM only at the knee for the next 2 weeks and can then begin AROM. She is noted to have full Lt knee extension, but limited passive knee flexion. She will benefit from skilled PT to address ROM, strength, gait, and balance deficits per MD clearance in order to return to PLOF.    OBJECTIVE IMPAIRMENTS: Abnormal gait, decreased activity tolerance, decreased balance, decreased knowledge of condition, decreased mobility, difficulty walking, decreased ROM, decreased strength, increased edema, improper body mechanics, postural dysfunction, and pain.   ACTIVITY LIMITATIONS: carrying, lifting, bending, standing, squatting, sleeping, stairs, transfers, bed mobility, bathing, dressing, reach over head, hygiene/grooming, and locomotion level  PARTICIPATION LIMITATIONS: meal prep, cleaning, laundry, driving, shopping, community activity, and yard work  PERSONAL FACTORS: Age, Fitness, Time since onset of injury/illness/exacerbation, and 3+ comorbidities: see PMH above  are also affecting patient's functional outcome.   REHAB POTENTIAL: Good  CLINICAL DECISION MAKING: Stable/uncomplicated  EVALUATION COMPLEXITY: Low   GOALS: Goals reviewed with patient? Yes  SHORT TERM GOALS: Target date: 06/09/2023  Patient will be independent and compliant with initial HEP.  Baseline:  Goal status: MET  2.  Patient will demonstrate at least 90 degrees of Lt knee AROM to improve ability to complete sit to stand transfer.  Baseline: 105 AROM Goal status: MET   3.  Patient will ambulate with step through pattern with LRAD once cleared by MD for  weight-bearing.  Baseline:  Goal status: MET   LONG TERM GOALS: Target date: 07/11/23  Patient will score at least 54% on FOTO to signify clinically meaningful improvement in functional abilities.  Baseline:50% Goal status: IN PROGRESS  2.  Patient will demonstrate 5/5 Lt knee strength to improve gait stability.  Baseline: deferred  Goal status: INITIAL  3.  Patient will demonstrate functional Lt knee AROM to improve gait mechanics.  Baseline: see above Goal status: INITIAL  4.  Patient will be able to ascend/descend stairs with reciprocal pattern with minimal/no Lt knee pain Baseline: step together Goal status: IN PROGRESS  5.  Patient will be able to complete household tasks with minimal/no Lt knee pain Baseline:  Goal status: MET  6.  Patient will be independent with advanced  home program to progress/maintain current level of function.  Baseline:  Goal status: IN PROGRESS   PLAN:  PT FREQUENCY: 1-2x/week  PT DURATION: 8 weeks  PLANNED INTERVENTIONS: 97164- PT Re-evaluation, 97110-Therapeutic exercises, 97530- Therapeutic activity, 97112- Neuromuscular re-education, 97535- Self Care, [id number]- Manual therapy, [id number]- Gait training, [id number]- Aquatic Therapy, 97014- Electrical stimulation (unattended), [id number]- Electrical stimulation (manual), 97016- Vasopneumatic device, [id number]- Ionotophoresis 4mg /ml Dexamethasone, Dry Needling, Cryotherapy, and Moist heat  PLAN FOR NEXT SESSION: Gait & stair training (long hallway) with SPC; knee AAROM/AROM progression as tolerated. Quad & glute strengthening, progress note    Lucie Meeter, PT, DPT, ATC 06/16/23 10:15 AM

## 2023-06-18 ENCOUNTER — Ambulatory Visit: Payer: Medicare HMO

## 2023-06-18 ENCOUNTER — Ambulatory Visit: Payer: Medicare HMO | Attending: Sports Medicine

## 2023-06-18 DIAGNOSIS — R600 Localized edema: Secondary | ICD-10-CM

## 2023-06-18 DIAGNOSIS — M6281 Muscle weakness (generalized): Secondary | ICD-10-CM | POA: Diagnosis present

## 2023-06-18 DIAGNOSIS — R2689 Other abnormalities of gait and mobility: Secondary | ICD-10-CM

## 2023-06-18 DIAGNOSIS — M25662 Stiffness of left knee, not elsewhere classified: Secondary | ICD-10-CM

## 2023-06-18 DIAGNOSIS — M25562 Pain in left knee: Secondary | ICD-10-CM | POA: Diagnosis present

## 2023-06-18 NOTE — Therapy (Addendum)
 OUTPATIENT PHYSICAL THERAPY LOWER EXTREMITY TREATMENT  Progress Note Reporting Period 05/12/23 to 06/18/23  See note below for Objective Data and Assessment of Progress/Goals.   PHYSICAL THERAPY DISCHARGE SUMMARY  Visits from Start of Care: 10  Current functional level related to goals / functional outcomes: See goals below   Remaining deficits: Status unknown   Education / Equipment: N/A   Patient agrees to discharge. Patient goals were partially met. Patient is being discharged due to not returning since the last visit.  Patient Name: Diane Chavez MRN: [id number] DOB:1956/03/25, 68 y.o., female Today's Date: 06/18/2023  END OF SESSION:  PT End of Session - 06/18/23 0924     Visit Number 10    Number of Visits 17    Date for PT Re-Evaluation 07/11/23    Authorization Type 17 VISITS APPROVED FOR PT    Authorization Time Period 11/26-1/25/25    Authorization - Visit Number 10    Authorization - Number of Visits 17    Progress Note Due on Visit 20    PT Start Time 0924    PT Stop Time 1008    PT Time Calculation (min) 44 min    Activity Tolerance Patient tolerated treatment well    Behavior During Therapy [facility] for tasks assessed/performed              Past Medical History:  Diagnosis Date   Anxiety    Crohn disease (HCC)    Dr. Jacqulyn Bath   Depression    on prozac previously   Hyperlipidemia    Miscarriage    Ulcerative colitis (HCC)    Past Surgical History:  Procedure Laterality Date   CARDIAC SURGERY     as a child   EYE SURGERY  10/30/2009   repair of detached retina   TONSILLECTOMY  06/17/1963   TUBAL LIGATION  1986   Patient Active Problem List   Diagnosis Date Noted   Varicose vein of leg 09/30/2022   DDD (degenerative disc disease), cervical 08/22/2021   Osteoarthritis of right acromioclavicular joint 07/26/2021   Memory change 02/05/2021   Lumbar degenerative disc disease 10/04/2018   Acute shoulder bursitis, left 05/26/2018   Tibial  plateau fracture, left 09/30/2017   Status post cardiac surgery 09/05/2013   Epiretinal membrane 12/10/2012   Osteoporosis 10/10/2010   BENIGN POSITIONAL VERTIGO 04/05/2010   Regional enteritis (HCC) 07/30/2009   Headache 07/30/2009   Asymptomatic postmenopausal status 04/25/2009   Anxiety state 09/20/2007   Insomnia 09/20/2007    PCP: Agapito Games, MD   REFERRING PROVIDER: Monica Becton, MD   REFERRING DIAG: S82.142D (ICD-10-CM) - Closed fracture of left tibial plateau with routine healing, subsequent encounter   THERAPY DIAG:  Acute pain of left knee  Localized edema  Other abnormalities of gait and mobility  Muscle weakness (generalized)  Stiffness of left knee, not elsewhere classified  Rationale for Evaluation and Treatment: Rehabilitation  ONSET DATE: 04/12/23  SUBJECTIVE:   SUBJECTIVE STATEMENT: Has been going without the cane with no issues. No pain.   EVAL: Patient sustained a fall 04/12/23 from the dog running into her. She was unable to bear weight right after the fall. She went to urgent care the following day and had an X-ray and was referred to Dr. Karie Schwalbe. She saw Dr. Karie Schwalbe a few days later and has been NWB in knee immobilizer since her visit with Dr. Karie Schwalbe due to tibial plateau fracture. She needs assistance getting up/down the stairs and getting dressed. She  is not completing housework right now either.   PERTINENT HISTORY: Lt tibial plateau fracture 04/12/23 Per patient osteoporosis  PAIN:  Are you having pain? No   PRECAUTIONS: Other: see weightbearing restrictions  RED FLAGS: None   WEIGHT BEARING RESTRICTIONS: Yes WBAT LLE  FALLS:  Has patient fallen in last 6 months? Yes. Number of falls 1;dog ran into her and she fell  LIVING ENVIRONMENT: Lives with: lives with their family Lives in: House/apartment Stairs: Yes: Internal: flight steps; can reach both Has following equipment at home: Single point cane, Quad cane small base, Walker  - 2 wheeled, and Wheelchair (manual)  OCCUPATION: retired  PLOF: Independent  PATIENT GOALS: "be able to walk again"  NEXT MD VISIT: 06/22/23  OBJECTIVE:  Note: Objective measures were completed at Evaluation unless otherwise noted.  DIAGNOSTIC FINDINGS:  Lt knee MRI IMPRESSION: 1. Comminuted tibial plateau fracture without significant displacement or depression. CT suggested for more definitive evaluation of the fracture. 2. Extensive horizontal cleavage-type tear involving the posterior horn and mid body region of the medial meniscus. 3. Intact ligamentous structures. 4. Large lipohemarthrosis.  PATIENT SURVEYS:  FOTO 31% function to 54% predicted  06/18/23: 66% function  COGNITION: Overall cognitive status: Within functional limits for tasks assessed     SENSATION: Not tested  EDEMA:  Mild swelling about Lt knee    POSTURE: Lt knee in knee immobilizer   PALPATION: TTP Lt anterior knee   LOWER EXTREMITY ROM:  Passive ROM Right eval Left eval 05/20/23 Left  05/26/23 Left  12/19 Left 12/26 Left 06/16/23 Left  06/18/23 Left   Hip flexion          Hip extension          Hip abduction          Hip adduction          Hip internal rotation          Hip external rotation          Knee flexion  50 80 AROM: 80  AROM 110 AROM 115 115 AROM 116 AROM  Knee extension  0        Ankle dorsiflexion          Ankle plantarflexion          Ankle inversion          Ankle eversion           (Blank rows = not tested)  LOWER EXTREMITY MMT:  MMT Right eval Left eval 06/18/23 Left   Hip flexion     Hip extension     Hip abduction     Hip adduction     Hip internal rotation     Hip external rotation     Knee flexion  deferred 4+  Knee extension  deferred 4  Ankle dorsiflexion     Ankle plantarflexion     Ankle inversion     Ankle eversion      (Blank rows = not tested)  LOWER EXTREMITY SPECIAL TESTS:  N/A  FUNCTIONAL TESTS:  N/A  GAIT: Distance walked: 10  ft  Assistive device utilized: Walker - 2 wheeled Level of assistance: Modified independence Comments: NWB LLE in knee immobilizer  [facility] Adult PT Treatment:  DATE: 06/18/23 Therapeutic Exercise: Recumbent bike level 1 x 5 minutes  Prone quad stretch 3 x 30 sec with strap LLE  Leg press 2 x 10 @ 65 lbs  LAQ 2 x 10 @ 3 lbs  Resisted HS curl blue band 2 x 10  SL eccentric leg press 2 x 10 @ 40 lbs LLE 4 inch lateral step down attempted unable due to weakness  SL calf raise 2 x 10  Mini squats with UE support 2 x 10  Updated HEP   Therapeutic Activity: Re-assessment to determine overall progress, educating patient on progress towards goals    [facility] Adult PT Treatment:                                                DATE: 06/16/23 Therapeutic Exercise: Recumbent bike no resistance x 5 minutes  Calf stretch on wedge x 1 minute SLR x 10  Standing calf raise 2 x 10  Standing toe raises 2 x 10  Calf stretch at wall x 30 sec  TKE blue band 2 x 10  Updated HEP   Therapeutic Activity: Stair negotiation step to descent; reciprocal ascent; 1 flight  Gait training: Working on heel strike and knee extension at initial contact and push-off with great toe on the LLE in long hallway of med center x 2 without AD     [facility] Adult PT Treatment:                                                DATE: 06/11/2023 Therapeutic Exercise: Runner's lunge gastroc stretch 2x30" Seated: Hip add iso ball squeeze 10x5" Bilateral clamshells + blue TB 2x10  Unilateral clamshells + blue TB x10 (B) LAQ + ball b/w knees x15 Long sitting HS stretch 3x30" Supine: AROM heel slides x20 SLR --> parallel & ER 2x10 each Therapeutic Activity: Gait training with rollator --> SPC; focus on functional knee flexion & heel strike Stair training with 2 railings --> SPC & 1 railing    [facility] Adult PT Treatment:                                                DATE:  06/08/2023 Therapeutic Exercise: Supine: AROM heel slides x10 SLR --> parallel x10, ER x10 Ankle ABCs (long-sitting) Seated HS stretch with strap 2x30" Standing (L) hip abd 2x10 Standing (L) hip ext 2x10 Seated LAQ 2x10x5" Therapeutic Activity: Gait training with rollator --> focus on functional knee flexion & heel strike Gait training with NBQC --> SPC    PATIENT EDUCATION:  Education details: see treatment; HEP update Person educated: Patient Education method: Explanation, demo, cues, handout Education comprehension: verbalized understanding, returned demo, cues   HOME EXERCISE PROGRAM: Access Code: [email or web address] URL: [email or web address] Date: 06/18/2023 Prepared by: Letitia Libra  Program Notes Walking 2x day without knee brace:Start with rollator --> progress to [facility]  Exercises - Supine Heel Slide with Strap  - 1 x daily - 7 x weekly - 3 sets - 10 reps - Supine Active Straight Leg Raise  - 1 x daily - 7 x weekly -  2 sets - 10 reps - Seated Knee Flexion AAROM  - 1 x daily - 7 x weekly - 1 sets - 10 reps - 5 sec  hold - Hip Flexor Stretch at Edge of Bed  - 1 x daily - 7 x weekly - 1 sets - 1-3 reps - 30 sec - 1 min hold - Gastroc Stretch on Wall  - 1 x daily - 7 x weekly - 3 sets - 30 sec  hold - Standing Hip Abduction with Counter Support  - 1 x daily - 7 x weekly - 3 sets - 10 reps - Standing Hip Extension with Counter Support  - 1 x daily - 7 x weekly - 3 sets - 10 reps - Seated Long Arc Quad  - 1 x daily - 7 x weekly - 3 sets - 10 reps - 5 sec hold - Standing Heel Raises  - 1 x daily - 7 x weekly - 2 sets - 10 reps - Standing Toe Raises at Chair  - 1 x daily - 7 x weekly - 2 sets - 10 reps - Standing Terminal Knee Extension with Resistance  - 1 x daily - 7 x weekly - 2 sets - 10 reps - Seated Hamstring Curl with Anchored Resistance  - 1 x daily - 7 x weekly - 2 sets - 10 reps   ASSESSMENT:  CLINICAL IMPRESSION: Jerri is making steady progress in PT  for her Lt tibial plateau fracture that she sustained from a fall on 04/12/23. She has been able to progress to WBAT with a SPC for majority of ambulation and has recently began short distance ambulating without an AD, though remains somewhat guarded during the gait cycle on the LLE so have been continuing to address gait mechanics in sessions. Her knee ROM and strength continue to improve with slight limitation into flexion AROM and mild knee weakness remaining. Weakness remains present with eccentric quadriceps activity with inability to descend stairs with the LLE. She will benefit from continuing with current POC to address lingering strength and ROM deficits in order to optimize her function.   EVAL: Patient is a 68 y.o. female who was seen today for physical therapy evaluation and treatment for Lt tibial plateau fracture that she sustained from a fall on 04/12/23. She is currently NWB in a knee immobilizer on the LLE with instructions to remain NWB until MD f/u on 05/25/23. She has ROM precautions as she is allowed to complete PROM only at the knee for the next 2 weeks and can then begin AROM. She is noted to have full Lt knee extension, but limited passive knee flexion. She will benefit from skilled PT to address ROM, strength, gait, and balance deficits per MD clearance in order to return to PLOF.    OBJECTIVE IMPAIRMENTS: Abnormal gait, decreased activity tolerance, decreased balance, decreased knowledge of condition, decreased mobility, difficulty walking, decreased ROM, decreased strength, increased edema, improper body mechanics, postural dysfunction, and pain.   ACTIVITY LIMITATIONS: carrying, lifting, bending, standing, squatting, sleeping, stairs, transfers, bed mobility, bathing, dressing, reach over head, hygiene/grooming, and locomotion level  PARTICIPATION LIMITATIONS: meal prep, cleaning, laundry, driving, shopping, community activity, and yard work  PERSONAL FACTORS: Age, Fitness, Time  since onset of injury/illness/exacerbation, and 3+ comorbidities: see PMH above  are also affecting patient's functional outcome.   REHAB POTENTIAL: Good  CLINICAL DECISION MAKING: Stable/uncomplicated  EVALUATION COMPLEXITY: Low   GOALS: Goals reviewed with patient? Yes  SHORT TERM GOALS: Target date:  06/09/2023  Patient will be independent and compliant with initial HEP.  Baseline:  Goal status: MET  2.  Patient will demonstrate at least 90 degrees of Lt knee AROM to improve ability to complete sit to stand transfer.  Baseline: 105 AROM Goal status: MET   3.  Patient will ambulate with step through pattern with LRAD once cleared by MD for weight-bearing.  Baseline:  Goal status: MET   LONG TERM GOALS: Target date: 07/11/23  Patient will score at least 54% on FOTO to signify clinically meaningful improvement in functional abilities.  Baseline:50% Goal status: MET  2.  Patient will demonstrate 5/5 Lt knee strength to improve gait stability.  Baseline: deferred  06/18/23: see above  Goal status: progressing   3.  Patient will demonstrate functional Lt knee AROM to improve gait mechanics.  Baseline: see above 06/18/23: see above  Goal status: progressing   4.  Patient will be able to ascend/descend stairs with reciprocal pattern with minimal/no Lt knee pain Baseline: step together 06/18/23: reciprocal ascent; step to descent  Goal status: IN PROGRESS  5.  Patient will be able to complete household tasks with minimal/no Lt knee pain Baseline:  Goal status: MET  6.  Patient will be independent with advanced home program to progress/maintain current level of function.  Baseline:  Goal status: IN PROGRESS   PLAN:  PT FREQUENCY: 1-2x/week  PT DURATION: 8 weeks  PLANNED INTERVENTIONS: 97164- PT Re-evaluation, 97110-Therapeutic exercises, 97530- Therapeutic activity, [id number]- Neuromuscular re-education, 97535- Self Care, 95621- Manual therapy, [id number]- Gait training, [id number]-  Aquatic Therapy, 97014- Electrical stimulation (unattended), [id number]- Electrical stimulation (manual), 97016- Vasopneumatic device, [id number]- Ionotophoresis 4mg /ml Dexamethasone, Dry Needling, Cryotherapy, and Moist heat  PLAN FOR NEXT SESSION: n/a d/c   Letitia Libra, PT, DPT, ATC 06/18/23 10:08 AM  Letitia Libra, PT, DPT, ATC 09/08/23 1:46 PM

## 2023-06-22 ENCOUNTER — Ambulatory Visit (INDEPENDENT_AMBULATORY_CARE_PROVIDER_SITE_OTHER): Payer: Medicare HMO | Admitting: Sports Medicine

## 2023-06-22 DIAGNOSIS — M810 Age-related osteoporosis without current pathological fracture: Secondary | ICD-10-CM

## 2023-06-22 DIAGNOSIS — S82142D Displaced bicondylar fracture of left tibia, subsequent encounter for closed fracture with routine healing: Secondary | ICD-10-CM

## 2023-06-22 MED ORDER — CELECOXIB 200 MG PO CAPS: 200.0000 mg | ORAL_CAPSULE | Freq: Every day | ORAL | 2 refills | Status: DC

## 2023-06-22 NOTE — Progress Notes (Signed)
    Procedures performed today:    None.  Independent interpretation of notes and tests performed by another provider:   None.  Brief History, Exam, Impression, and Recommendations:    Osteoporosis Ordering updated bone densitometry  Tibial plateau fracture, left, meniscal tearing, osteoarthritis This is a exquisitely pleasant 68 year old female, she had a fall at the end of October, ultimately we diagnosed a tibial plateau fracture and aspirated hemarthrosis. MRI at the time showed medial meniscal tearing. We initially had her nonweightbearing in a knee immobilizer, PT, at the last visit approximately a month ago she was doing a lot better, weightbearing without pain but did have some tenderness still. We transitioned her into a reaction brace, she continued therapy and was doing a lot better, for the most part pain-free. She did get a recurrence of pain with leg press at 60 pounds, and would like to drop back down to 40. She is down to a 2/10 of pain. On exam she does have some tenderness at the medial and lateral femoral condyles, minimal swelling but no effusion. She is taking some ibuprofen to 400 mg, we will bump her up to Celebrex  and discontinue ibuprofen for now, we will do this for about 2 to 4 weeks and if persistent discomfort we will consider injection. At this point I think her pain is likely coming from her degenerative changes and meniscal tearing as opposed to the tibial plateau fracture.    ____________________________________________ Debby PARAS. Curtis, M.D., ABFM., CAQSM., AME. Primary Care and Sports Medicine [facility]  Adjunct Professor of [facility] Medicine  [employer]  Restaurant Manager, [employer]

## 2023-06-22 NOTE — Assessment & Plan Note (Signed)
 This is a exquisitely pleasant 68 year old female, she had a fall at the end of October, ultimately we diagnosed a tibial plateau fracture and aspirated hemarthrosis. MRI at the time showed medial meniscal tearing. We initially had her nonweightbearing in a knee immobilizer, PT, at the last visit approximately a month ago she was doing a lot better, weightbearing without pain but did have some tenderness still. We transitioned her into a reaction brace, she continued therapy and was doing a lot better, for the most part pain-free. She did get a recurrence of pain with leg press at 60 pounds, and would like to drop back down to 40. She is down to a 2/10 of pain. On exam she does have some tenderness at the medial and lateral femoral condyles, minimal swelling but no effusion. She is taking some ibuprofen to 400 mg, we will bump her up to Celebrex  and discontinue ibuprofen for now, we will do this for about 2 to 4 weeks and if persistent discomfort we will consider injection. At this point I think her pain is likely coming from her degenerative changes and meniscal tearing as opposed to the tibial plateau fracture.

## 2023-06-22 NOTE — Assessment & Plan Note (Signed)
 Ordering updated bone densitometry

## 2023-06-23 ENCOUNTER — Ambulatory Visit: Payer: Medicare HMO

## 2023-07-01 ENCOUNTER — Ambulatory Visit: Payer: Medicare HMO

## 2023-07-08 ENCOUNTER — Ambulatory Visit: Payer: Medicare HMO

## 2023-07-08 DIAGNOSIS — M810 Age-related osteoporosis without current pathological fracture: Secondary | ICD-10-CM | POA: Diagnosis not present

## 2023-07-08 DIAGNOSIS — Z780 Asymptomatic menopausal state: Secondary | ICD-10-CM | POA: Diagnosis not present

## 2023-07-08 DIAGNOSIS — M8588 Other specified disorders of bone density and structure, other site: Secondary | ICD-10-CM | POA: Diagnosis not present

## 2023-07-09 ENCOUNTER — Encounter: Payer: Self-pay | Admitting: Sports Medicine

## 2023-07-15 DIAGNOSIS — H2513 Age-related nuclear cataract, bilateral: Secondary | ICD-10-CM | POA: Diagnosis not present

## 2023-08-06 DIAGNOSIS — H269 Unspecified cataract: Secondary | ICD-10-CM | POA: Diagnosis not present

## 2023-08-06 DIAGNOSIS — H524 Presbyopia: Secondary | ICD-10-CM | POA: Diagnosis not present

## 2023-08-07 DIAGNOSIS — H5213 Myopia, bilateral: Secondary | ICD-10-CM | POA: Diagnosis not present

## 2023-09-25 ENCOUNTER — Encounter: Payer: Self-pay | Admitting: Family Medicine

## 2023-09-25 DIAGNOSIS — Z13220 Encounter for screening for lipoid disorders: Secondary | ICD-10-CM

## 2023-09-28 DIAGNOSIS — Z13220 Encounter for screening for lipoid disorders: Secondary | ICD-10-CM | POA: Diagnosis not present

## 2023-09-28 DIAGNOSIS — E7800 Pure hypercholesterolemia, unspecified: Secondary | ICD-10-CM | POA: Diagnosis not present

## 2023-09-29 ENCOUNTER — Encounter: Payer: Self-pay | Admitting: Family Medicine

## 2023-09-29 LAB — LIPID PANEL
Chol/HDL Ratio: 2.8 ratio (ref 0.0–4.4)
Cholesterol, Total: 246 mg/dL — ABNORMAL HIGH (ref 100–199)
HDL: 89 mg/dL (ref >39)
LDL Chol Calc (NIH): 149 mg/dL — ABNORMAL HIGH (ref 0–99)
Triglycerides: 51 mg/dL (ref 0–149)
VLDL Cholesterol Cal: 8 mg/dL (ref 5–40)

## 2023-09-29 NOTE — Progress Notes (Signed)
 Hi Diane Chavez, LDL is elevated at 149.  Still out of the range.  Goal is under 100.  Triglycerides look great though.  And you have wonderful HDL, which is the good kind of cholesterol.  Right now your cardiovascular risk score is still within an acceptable range so we do not need to start a cholesterol-lowering medication at this point in time.  Just recommend that we keep an eye on it and plan to recheck again in 1 year.  Continue to work on trying to shoot for about 20 minutes of moderate intensity exercise 5 days a week.  The 10-year ASCVD risk score (Arnett DK, et al., 2019) is: 6.3%   Values used to calculate the score:     Age: 68 years     Sex: Female     Is Non-Hispanic African American: No     Diabetic: No     Tobacco smoker: No     Systolic Blood Pressure: 126 mmHg     Is BP treated: No     HDL Cholesterol: 89 mg/dL     Total Cholesterol: 246 mg/dL

## 2023-09-29 NOTE — Progress Notes (Signed)
 Hi Diane Chavez, I know you are pretty healthy eater but there is some really really great data behind the Mediterranean diet.  There is a lot of good information on the [employer]nz website.  You may already be doing a lot of these things but it puts a lot of focus on more fish in beings and limiting t red meat and sweets to once a week.  It may be worth checking out just to make some tweaks to your diet.  We can still consider a statin to lower your numbers.  It is absolutely reasonable to do so if you would like to try that.  But I think just increasing the exercise and then may be taking a look at the Mediterranean diet could be helpful.

## 2024-01-26 DIAGNOSIS — H02834 Dermatochalasis of left upper eyelid: Secondary | ICD-10-CM | POA: Diagnosis not present

## 2024-01-26 DIAGNOSIS — H02831 Dermatochalasis of right upper eyelid: Secondary | ICD-10-CM | POA: Diagnosis not present

## 2024-01-29 DIAGNOSIS — H02831 Dermatochalasis of right upper eyelid: Secondary | ICD-10-CM | POA: Diagnosis not present

## 2024-01-29 DIAGNOSIS — H02832 Dermatochalasis of right lower eyelid: Secondary | ICD-10-CM | POA: Diagnosis not present

## 2024-02-03 ENCOUNTER — Encounter: Payer: Self-pay | Admitting: Family Medicine

## 2024-02-03 ENCOUNTER — Ambulatory Visit (INDEPENDENT_AMBULATORY_CARE_PROVIDER_SITE_OTHER): Admitting: Family Medicine

## 2024-02-03 VITALS — BP 143/78 | HR 71 | Ht 69.0 in | Wt 129.0 lb

## 2024-02-03 DIAGNOSIS — G8929 Other chronic pain: Secondary | ICD-10-CM | POA: Diagnosis not present

## 2024-02-03 DIAGNOSIS — R413 Other amnesia: Secondary | ICD-10-CM

## 2024-02-03 DIAGNOSIS — M810 Age-related osteoporosis without current pathological fracture: Secondary | ICD-10-CM

## 2024-02-03 DIAGNOSIS — M25562 Pain in left knee: Secondary | ICD-10-CM

## 2024-02-03 DIAGNOSIS — M25561 Pain in right knee: Secondary | ICD-10-CM | POA: Diagnosis not present

## 2024-02-03 NOTE — Progress Notes (Signed)
 Established Patient Office Visit  Subjective  Patient ID: Diane Chavez, female    DOB: 1955-06-21  Age: 68 y.o. MRN: [id number]  Chief Complaint  Patient presents with   Leg Pain    HPI  Discussed the use of AI scribe software for clinical note transcription with the patient, who gave verbal consent to proceed.  History of Present Illness Diane Chavez is a 68 year old female who presents with memory concerns and confusion.  Cognitive impairment and memory loss - Memory issues with word-finding difficulty, leading to pauses and confusion during conversations - Increased frequency of memory lapses causing distress, particularly in social settings such as church - Avoidance of speaking in groups due to fear of forgetting words - Short-term memory mostly intact, but occasional forgetfulness regarding recent activities (e.g., whether she fed the dogs) - Increased frustration with technology and difficulty managing passwords, contributing to stress - Family history of Alzheimer's disease (mother affected) - Previous brief memory test performed; MMSE from August 2022 with a score of 29 out of 30.  Disorientation and driving difficulties - Confusion while driving, even in familiar areas.   - Discomfort driving alone; husband frequently drives her - Increased attempts to drive independently since acquiring a new vehicle - Difficulty with directions and increased anxiety about [place] interstate since [place] in [place] 2024  Musculoskeletal pain and injury - History of meniscus tear in the knee, treated with physical therapy - Occasional knee pain, especially when descending stairs - Recent fall on a treadmill resulting in Right knee pain, not formally evaluated  Venous insufficiency and thromboembolic concerns - Concern about blood clots, particularly due to past vein issues  prior long plane ride in September 2024 - Wears compression stockings for venous  support  Imaging and diagnostic history - Previous brain MRI performed; IV placement was difficult  Support system - Daughter is an Nutritional therapist and provides support for health concerns      ROS    Objective:     BP (!) 143/78   Pulse 71   Ht 5' 9 (1.753 m)   Wt 129 lb (58.5 kg)   LMP 06/11/2000   SpO2 100%   BMI 19.05 kg/m    Physical Exam Vitals and nursing note reviewed.  Constitutional:      Appearance: Normal appearance.  HENT:     Head: Normocephalic and atraumatic.  Eyes:     Conjunctiva/sclera: Conjunctivae normal.  Cardiovascular:     Rate and Rhythm: Normal rate and regular rhythm.  Pulmonary:     Effort: Pulmonary effort is normal.     Breath sounds: Normal breath sounds.  Musculoskeletal:     Comments: Right knee does look a little larger than the left.  She has a little bit more atrophy in the right lower leg compared to the left leg.  Nontender calf squeeze.  Little discomfort laterally with dorsiflexion of the left foot.  No pain with dorsiflexion of the right foot.  Skin:    General: Skin is warm and dry.  Neurological:     Mental Status: She is alert and oriented to person, place, and time.  Psychiatric:        Mood and Affect: Mood normal.        Behavior: Behavior normal.      No results found for any visits on 02/03/24.    The 10-year ASCVD risk score (Arnett DK, et al., 2019) is: 7.9%    Assessment &  Plan:   Problem List Items Addressed This Visit       Musculoskeletal and Integument   Osteoporosis   Now on combo calcium and vit D.       Relevant Orders   TSH   CBC   CMP14+EGFR   B12 and Folate Panel   Vitamin B1   Vitamin B6     Other   Memory change   MOCA score of 27/30 today. Will refer for Neuropsych testing.  MIR 3/23 normal. Will get labs today.       Relevant Orders   TSH   CBC   CMP14+EGFR   B12 and Folate Panel   Vitamin B1   Vitamin B6   Ambulatory referral to Neuropsychology   Other Visit  Diagnoses       Chronic pain of both knees    -  Primary   Relevant Orders   TSH   CBC   CMP14+EGFR   B12 and Folate Panel   Vitamin B1   Vitamin B6      Assessment and Plan Assessment & Plan Cognitive impairment, unspecified Reports word finding difficulty, occasional driving confusion, and short-term memory lapses. Family history of Alzheimer's disease. Previous MRI ruled out tumors, fluid pressure, and old strokes. Differential includes vitamin deficiencies or metabolic causes. - Order blood work for vitamin B12 deficiency and other metabolic causes. - Refer for neurocognitive testing to assess cognitive changes and establish baseline.  Bilateral knee pain with history of meniscus tear and prior injury Reports bilateral knee pain with activity. No swelling or tenderness indicative of clots. History of treadmill accident exacerbating pain. - Recommend physical therapy exercises to strengthen knee muscles and improve stability. - Consider referral to physical therapy if home exercises are ineffective.   No follow-ups on file.    Dorothyann Byars, MD

## 2024-02-03 NOTE — Assessment & Plan Note (Signed)
 MOCA score of 27/30 today. Will refer for Neuropsych testing.  MIR 3/23 normal. Will get labs today.

## 2024-02-03 NOTE — Assessment & Plan Note (Signed)
 Now on combo calcium and vit D.

## 2024-02-08 ENCOUNTER — Ambulatory Visit: Payer: Self-pay | Admitting: Family Medicine

## 2024-02-08 NOTE — Progress Notes (Signed)
 Labs normal, vit B1 pending.

## 2024-02-11 LAB — CMP14+EGFR
ALT: 13 IU/L (ref 0–32)
AST: 22 IU/L (ref 0–40)
Albumin: 4.8 g/dL (ref 3.9–4.9)
Alkaline Phosphatase: 83 IU/L (ref 44–121)
BUN/Creatinine Ratio: 13 (ref 12–28)
BUN: 9 mg/dL (ref 8–27)
Bilirubin Total: 0.6 mg/dL (ref 0.0–1.2)
CO2: 24 mmol/L (ref 20–29)
Calcium: 10.1 mg/dL (ref 8.7–10.3)
Chloride: 101 mmol/L (ref 96–106)
Creatinine, Ser: 0.68 mg/dL (ref 0.57–1.00)
Globulin, Total: 2.4 g/dL (ref 1.5–4.5)
Glucose: 82 mg/dL (ref 70–99)
Potassium: 4.2 mmol/L (ref 3.5–5.2)
Sodium: 140 mmol/L (ref 134–144)
Total Protein: 7.2 g/dL (ref 6.0–8.5)
eGFR: 95 mL/min/1.73 (ref >59)

## 2024-02-11 LAB — CBC
Hematocrit: 40.8 % (ref 34.0–46.6)
Hemoglobin: 13.4 g/dL (ref 11.1–15.9)
MCH: 31.8 pg (ref 26.6–33.0)
MCHC: 32.8 g/dL (ref 31.5–35.7)
MCV: 97 fL (ref 79–97)
Platelets: 217 x10E3/uL (ref 150–450)
RBC: 4.22 x10E6/uL (ref 3.77–5.28)
RDW: 12.3 % (ref 11.7–15.4)
WBC: 6.1 x10E3/uL (ref 3.4–10.8)

## 2024-02-11 LAB — TSH: TSH: 0.511 u[IU]/mL (ref 0.450–4.500)

## 2024-02-11 LAB — B12 AND FOLATE PANEL
Folate: >20 ng/mL (ref >3.0)
Vitamin B-12: 644 pg/mL (ref 232–1245)

## 2024-02-11 LAB — VITAMIN B1: Thiamine: 124.9 nmol/L (ref 66.5–200.0)

## 2024-02-11 LAB — VITAMIN B6: Vitamin B6: 57.9 ug/L (ref 3.4–65.2)

## 2024-02-12 NOTE — Progress Notes (Signed)
 Vitamin B1 also known as thiamine looks good.  Like to get you scheduled for your Medicare wellness exam with Jon she can do a telephone call for that.

## 2024-02-16 ENCOUNTER — Encounter: Payer: Self-pay | Admitting: Sports Medicine

## 2024-02-24 ENCOUNTER — Ambulatory Visit (INDEPENDENT_AMBULATORY_CARE_PROVIDER_SITE_OTHER)

## 2024-02-24 VITALS — BP 128/70 | HR 62 | Ht 69.0 in | Wt 132.0 lb

## 2024-02-24 DIAGNOSIS — Z23 Encounter for immunization: Secondary | ICD-10-CM | POA: Diagnosis not present

## 2024-02-24 DIAGNOSIS — Z0000 Encounter for general adult medical examination without abnormal findings: Secondary | ICD-10-CM | POA: Diagnosis not present

## 2024-02-24 NOTE — Progress Notes (Signed)
 Subjective:   Diane Chavez is a 68 y.o. female who presents for Medicare Annual (Subsequent) preventive examination.  Visit Complete: In person  Patient Medicare AWV questionnaire was completed by the patient on 02/19/2024; I have confirmed that all information answered by patient is correct and no changes since this date.  Cardiac Risk Factors include: advanced age (>30men, >14 women);dyslipidemia     Objective:    Today's Vitals   02/24/24 0944  BP: 128/70  Pulse: 62  SpO2: 100%  Weight: 132 lb (59.9 kg)  Height: 5' 9 (1.753 m)   Body mass index is 19.49 kg/m.     02/24/2024   10:03 AM 05/12/2023    9:34 AM 07/21/2022    3:39 PM 03/24/2019    8:03 AM 12/15/2018    3:01 PM 06/01/2018    8:49 AM 07/31/2015    9:21 AM  Advanced Directives  Does Patient Have a Medical Advance Directive? Yes Yes Yes Yes Yes Yes  Yes   Type of Estate agent of [place];Living will Living will;Healthcare Power of [employer] Power of [place];Living will Healthcare Power of [place];Living will Healthcare Power of [place];Living will Healthcare Power of [place];Living will Healthcare Power of [place];Living will   Does patient want to make changes to medical advance directive? No - Patient declined  No - Patient declined    No - Patient declined   Copy of Healthcare Power of Attorney in Chart?   No - copy requested  No - copy requested  No - copy requested  No - copy requested      Data saved with a previous flowsheet row definition    Current Medications (verified) Outpatient Encounter Medications as of 02/24/2024  Medication Sig   Calcium Carbonate-Vit D-Min (CALCIUM 1200 PO) Take by mouth.     cholecalciferol (VITAMIN D ) 1000 units tablet Take 1,000 Units by mouth daily.   Omega-3 Fatty Acids (OMEGA-3 CF) 1000 MG CAPS Take by mouth.   dicyclomine (BENTYL) 10 MG capsule  (Patient not taking: Reported on 02/24/2024)   LUTEIN PO Take 1 tablet by mouth daily.  (Patient not taking: Reported on 02/24/2024)   No facility-administered encounter medications on file as of 02/24/2024.    Allergies (verified) Patient has no known allergies.   History: Past Medical History:  Diagnosis Date   Anxiety    Crohn disease (HCC)    Dr. Darra   Depression    on prozac previously   Hyperlipidemia    Miscarriage    Ulcerative colitis [facility])    Past Surgical History:  Procedure Laterality Date   CARDIAC SURGERY     as a child   EYE SURGERY  10/30/2009   repair of detached retina   TONSILLECTOMY  06/17/1963   TUBAL LIGATION  1986   Family History  Problem Relation Age of Onset   Alzheimer's disease Mother    Social History   Socioeconomic History   Marital status: Married    Spouse name: Arley   Number of children: 3   Years of education: Not on file   Highest education level: Some college, no degree  Occupational History   Occupation: Diplomatic Services operational officer    Comment: [employer]  Tobacco Use   Smoking status: Former    Current packs/day: 0.00    Types: Cigarettes    Quit date: 06/16/1973    Years since quitting: 50.7   Smokeless tobacco: Never   Tobacco comments:    I didn't inhale, just trying  to look cool for a little while.  Vaping Use   Vaping status: Never Used  Substance and Sexual Activity   Alcohol use: Yes    Alcohol/week: 7.0 standard drinks of alcohol    Types: 7 Glasses of wine per week    Comment: one beer or one glass of wine most evenings   Drug use: No   Sexual activity: Yes    Birth control/protection: Post-menopausal  Other Topics Concern   Not on file  Social History Narrative   Lives with husband.Regular exercise-no. She loves working in her yard. She reads and place word games.    Social Drivers of Corporate investment banker Strain: Low Risk  (02/24/2024)   Overall Financial Resource Strain (CARDIA)    Difficulty of Paying Living Expenses: Not hard at all  Food Insecurity: No Food Insecurity (02/24/2024)   Hunger  Vital Sign    Worried About Running Out of Food in the Last Year: Never true    Ran Out of Food in the Last Year: Never true  Transportation Needs: No Transportation Needs (02/24/2024)   PRAPARE - Administrator, Civil Service (Medical): No    Lack of Transportation (Non-Medical): No  Physical Activity: Insufficiently Active (02/24/2024)   Exercise Vital Sign    Days of Exercise per Week: 4 days    Minutes of Exercise per Session: 30 min  Stress: No Stress Concern Present (02/24/2024)   [employer] of Occupational Health - Occupational Stress Questionnaire    Feeling of Stress: Only a little  Social Connections: Socially Integrated (02/24/2024)   Social Connection and Isolation Panel    Frequency of Communication with Friends and Family: More than three times a week    Frequency of Social Gatherings with Friends and Family: Twice a week    Attends Religious Services: More than 4 times per year    Active Member of [employer] or Organizations: Yes    Attends Engineer, structural: More than 4 times per year    Marital Status: Married    Tobacco Counseling Counseling given: Not Answered Tobacco comments: I didn't inhale, just trying to look cool for a little while.   Clinical Intake:  Pre-visit preparation completed: Yes  Pain : No/denies pain     BMI - recorded: 19.49 Nutritional Status: BMI of 19-24  Normal Nutritional Risks: None  How often do you need to have someone help you when you read instructions, pamphlets, or other written materials from your doctor or pharmacy?: 1 - Never What is the last grade level you completed in school?: 13  Interpreter Needed?: No      Activities of Daily Living    02/24/2024    9:46 AM 02/20/2024   12:43 PM  In your present state of health, do you have any difficulty performing the following activities:  Hearing? 0 0  Vision? 0 0  Difficulty concentrating or making decisions? 1 1  Walking or climbing stairs? 0 0   Dressing or bathing? 0 0  Doing errands, shopping? 0 0  Preparing Food and eating ? N N  Using the Toilet? N N  In the past six months, have you accidently leaked urine? N N  Do you have problems with loss of bowel control? N N  Managing your Medications? N N  Managing your Finances? N N  Housekeeping or managing your Housekeeping? N N    Patient Care Team: Alvan Dorothyann BIRCH, MD as PCP - General Candi Bruckner  D, MD (Gastroenterology)  Indicate any recent Medical Services you may have received from other than [facility] providers in the past year (date may be approximate).     Assessment:   This is a routine wellness examination for [place].  Hearing/Vision screen No results found.   Goals Addressed             This Visit's Progress    Patient Stated       Patient states she would like to work on lowering cholesterol.        Depression Screen    02/24/2024    9:57 AM 02/03/2024   10:37 AM 04/08/2023    9:58 AM 03/24/2022    4:00 PM 11/21/2021    1:09 PM 08/19/2021   10:14 AM 04/23/2020    3:25 PM  PHQ 2/9 Scores  PHQ - 2 Score 0 0 0 0 0 0 0  PHQ- 9 Score  0  2       Fall Risk    02/24/2024   10:03 AM 02/20/2024   12:43 PM 02/03/2024   10:37 AM 07/21/2022    3:28 PM 03/24/2022    2:17 PM  Fall Risk   Falls in the past year? 1 1 1  0 0  Number falls in past yr: 1 1 0 0 0  Injury with Fall? 1 1 1  0 0  Risk for fall due to : History of fall(s)  History of fall(s) No Fall Risks No Fall Risks  Follow up Falls evaluation completed  Falls evaluation completed Falls evaluation completed     MEDICARE RISK AT HOME: Medicare Risk at Home Any stairs in or around the home?: Yes If so, are there any without handrails?: No Home free of loose throw rugs in walkways, pet beds, electrical cords, etc?: Yes Adequate lighting in your home to reduce risk of falls?: Yes Life alert?: No Use of a cane, walker or w/c?: No Grab bars in the bathroom?: Yes Shower chair or bench  in shower?: Yes Elevated toilet seat or a handicapped toilet?: Yes  TIMED UP AND GO:  Was the test performed?  Yes  Length of time to ambulate 10 feet: 7 sec Gait steady and fast without use of assistive device    Cognitive Function:    02/05/2021    1:55 PM  MMSE - Mini Mental State Exam  Orientation to time 5  Orientation to Place 5  Registration 3  Attention/ Calculation 5  Recall 2  Language- name 2 objects 2  Language- repeat 1  Language- follow 3 step command 3  Language- read & follow direction 1  Write a sentence 1  Copy design 1  Total score 29      02/03/2024   11:52 AM  Montreal Cognitive Assessment   Visuospatial/ Executive (0/5) 5  Naming (0/3) 3  Attention: Read list of digits (0/2) 2  Attention: Read list of letters (0/1) 1  Attention: Serial 7 subtraction starting at 100 (0/3) 2  Language: Repeat phrase (0/2) 2  Language : Fluency (0/1) 1  Abstraction (0/2) 2  Delayed Recall (0/5) 3  Orientation (0/6) 6  Total 27  Adjusted Score (based on education) 27      02/24/2024   10:08 AM 02/03/2024   10:38 AM 07/21/2022    3:27 PM  6CIT Screen  What Year? 0 points 0 points 0 points  What month? 0 points 0 points 0 points  What time? 0 points 0 points 0 points  Count back from 20 0 points 0 points 0 points  Months in reverse 2 points 2 points 0 points  Repeat phrase 0 points 0 points 0 points  Total Score 2 points 2 points 0 points    Immunizations Immunization History  Administered Date(s) Administered   Fluad Quad(high Dose 65+) 03/24/2022   Fluad Trivalent(High Dose 65+) 04/08/2023   INFLUENZA, HIGH DOSE SEASONAL PF 02/24/2024   Influenza,inj,Quad PF,6+ Mos 02/14/2017, 03/23/2019, 03/28/2020   Influenza,inj,quad, With Preservative 04/06/2018   Influenza-Unspecified 03/30/2013, 02/25/2014, 05/14/2015, 02/26/2016, 03/16/2021   PFIZER(Purple Top)SARS-COV-2 Vaccination 09/22/2019, 10/13/2019, 02/01/2021   PNEUMOCOCCAL CONJUGATE-20 02/05/2021    RSV,unspecified 06/04/2022   Td 06/16/2000, 10/31/2009   Td (Adult), 2 Lf Tetanus Toxid, Preservative Free 06/16/2000, 10/31/2009   Tdap 04/23/2020   Zoster Recombinant(Shingrix) 04/23/2020, 02/01/2021    TDAP status: Up to date  Flu Vaccine status: Completed at today's visit  Pneumococcal vaccine status: Up to date  Covid-19 vaccine status: Information provided on how to obtain vaccines.   Qualifies for Shingles Vaccine? Yes   Zostavax completed No   Shingrix Completed?: Yes  Screening Tests Health Maintenance  Topic Date Due   COVID-19 Vaccine (4 - 2025-26 season) 02/15/2024   MAMMOGRAM  03/31/2024   Medicare Annual Wellness (AWV)  02/23/2025   Colonoscopy  08/07/2029   DTaP/Tdap/Td (6 - Td or Tdap) 04/23/2030   Pneumococcal Vaccine: 50+ Years  Completed   Influenza Vaccine  Completed   DEXA SCAN  Completed   Hepatitis C Screening  Completed   Zoster Vaccines- Shingrix  Completed   HPV VACCINES  Aged Out   Meningococcal B Vaccine  Aged Out    Health Maintenance  Health Maintenance Due  Topic Date Due   COVID-19 Vaccine (4 - 2025-26 season) 02/15/2024    Colorectal cancer screening: Type of screening: Colonoscopy. Completed 08/08/2019. Repeat every 10 years  Mammogram status: Completed 04/01/2023. Repeat every year  Bone Density status: Completed 07/08/2023. Results reflect: Bone density results: OSTEOPENIA. Repeat every 0 years.  Lung Cancer Screening: (Low Dose CT Chest recommended if Age 74-80 years, 20 pack-year currently smoking OR have quit w/in 15years.) does not qualify.   Lung Cancer Screening Referral: n/a  Additional Screening:  Hepatitis C Screening: does qualify; Completed 08/22/2015  Vision Screening: Recommended annual ophthalmology exams for early detection of glaucoma and other disorders of the eye. Is the patient up to date with their annual eye exam?  Yes  Who is the provider or what is the name of the office in which the patient attends  annual eye exams? [facility] in [place] If pt is not established with a provider, would they like to be referred to a provider to establish care? N/a.   Dental Screening: Recommended annual dental exams for proper oral hygiene   Community Resource Referral / Chronic Care Management: CRR required this visit?  No   CCM required this visit?  No     Plan:     I have personally reviewed and noted the following in the patient's chart:   Medical and social history Use of alcohol, tobacco or illicit drugs  Current medications and supplements including opioid prescriptions. Patient is not currently taking opioid prescriptions. Functional ability and status Nutritional status Physical activity Advanced directives List of other physicians Hospitalizations # 0, surgeries # 0, and ER # 1 visits in previous 12 months Vitals Screenings to include cognitive, depression, and falls Referrals and appointments  In addition, I have reviewed and discussed with patient certain preventive  protocols, quality metrics, and best practice recommendations. A written personalized care plan for preventive services as well as general preventive health recommendations were provided to patient.     Bonny Jon Mayor, CMA   02/24/2024   After Visit Summary: (In Person-Printed) AVS printed and given to the patient  Nurse Notes:   Diane Chavez is a 68 y.o. female patient of Dorothyann Byars, MD who had a Medicare Annual Wellness Visit today. She reports that he is socially active and does interact with friends/family regularly. She is moderately physically active. She enjoys reading and working in her yard.

## 2024-02-24 NOTE — Patient Instructions (Signed)
  Diane Chavez , Thank you for taking time to come for your Medicare Wellness Visit. I appreciate your ongoing commitment to your health goals. Please review the following plan we discussed and let me know if I can assist you in the future.   These are the goals we discussed:  Goals      Exercise 150 min/wk Moderate Activity     Planning on going to the gym.       Patient Stated     Patient states she would like to work on lowering cholesterol.         This is a list of the screening recommended for you and due dates:  Health Maintenance  Topic Date Due   Flu Shot  01/15/2024   COVID-19 Vaccine (4 - 2025-26 season) 02/15/2024   Mammogram  03/31/2024   Medicare Annual Wellness Visit  02/23/2025   Colon Cancer Screening  08/07/2029   DTaP/Tdap/Td vaccine (6 - Td or Tdap) 04/23/2030   Pneumococcal Vaccine for age over 24  Completed   DEXA scan (bone density measurement)  Completed   Hepatitis C Screening  Completed   Zoster (Shingles) Vaccine  Completed   HPV Vaccine  Aged Out   Meningitis B Vaccine  Aged Out

## 2024-03-18 ENCOUNTER — Ambulatory Visit (HOSPITAL_BASED_OUTPATIENT_CLINIC_OR_DEPARTMENT_OTHER)
Admission: RE | Admit: 2024-03-18 | Discharge: 2024-03-18 | Disposition: A | Discharge status: Home / Self Care | Source: Ambulatory Visit | Attending: Sports Medicine | Admitting: Sports Medicine

## 2024-03-18 ENCOUNTER — Encounter: Payer: Self-pay | Admitting: Sports Medicine

## 2024-03-18 ENCOUNTER — Ambulatory Visit: Admitting: Sports Medicine

## 2024-03-18 VITALS — BP 100/66 | Ht 69.0 in | Wt 132.0 lb

## 2024-03-18 DIAGNOSIS — M25561 Pain in right knee: Secondary | ICD-10-CM | POA: Diagnosis not present

## 2024-03-18 DIAGNOSIS — S8991XA Unspecified injury of right lower leg, initial encounter: Secondary | ICD-10-CM | POA: Diagnosis not present

## 2024-03-18 DIAGNOSIS — M25562 Pain in left knee: Secondary | ICD-10-CM

## 2024-03-18 DIAGNOSIS — M170 Bilateral primary osteoarthritis of knee: Secondary | ICD-10-CM | POA: Diagnosis not present

## 2024-03-18 DIAGNOSIS — S82002A Unspecified fracture of left patella, initial encounter for closed fracture: Secondary | ICD-10-CM | POA: Diagnosis not present

## 2024-03-18 NOTE — Progress Notes (Signed)
   Subjective:    Patient ID: Diane Chavez, female    DOB: 1956/01/26, 68 y.o.   MRN: [id number]  HPI chief complaint: Bilateral knee pain  [name] is a very pleasant 68 year old female that presents today with bilateral knee pain.  She injured the left knee approximately 1 year ago.  She suffered a comminuted tibial plateau fracture and medial meniscal tear.  This seemed to heal well with conservative treatment.  No surgery was required.  She recently injured the right knee after falling off of the treadmill.  Pain in both of her knees is retropatellar.  It is worse with squatting and going up and down stairs.  She is able to walk without any pain.  She does endorse some intermittent swelling in the right knee.  No prior knee surgeries on either knee.  Past medical history reviewed Medications reviewed Allergies reviewed   Review of Systems As above    Objective:   Physical Exam  Well-developed, well-nourished.  No acute distress  Right knee: Full range of motion.  2+ patellofemoral crepitus.  No effusion.  Slight tenderness to palpation along the medial joint line.  No tenderness along the lateral joint line.  Knee is stable to valgus and varus stressing.  Negative anterior drawer, negative posterior drawer.  Negative Thessaly.  Neurovascularly intact distally.  Left knee: Full range of motion.  1+ patellofemoral crepitus.  No effusion.  Slight tenderness to palpation along the medial joint line.  No tenderness along the lateral joint line.  Knee is stable to valgus and varus stressing.  Negative anterior drawer, negative posterior drawer.  Negative Thessaly.  Neurovascularly intact distally.  X-rays of both knees including AP, lateral, sunrise, and oblique views are unremarkable other than some mild medial joint space loss in the left knee.  Previous tibial plateau fracture has healed.    Assessment & Plan:   Bilateral knee pain likely secondary to mild patellofemoral  DJD/chondromalacia patella  I think Jylian would benefit greatly from formal physical therapy.  We will refer her to renew here in [facility] and she will wean to a home exercise program per the therapist's discretion.  I recommended that she avoid any exercise that involves repetitive squatting or lunging.  Otherwise, no restrictions on activity and follow-up as needed.  This note was dictated using Dragon naturally speaking software and may contain errors in syntax, spelling, or content which have not been identified prior to signing this note.

## 2024-03-21 ENCOUNTER — Encounter: Payer: Self-pay | Admitting: Family Medicine

## 2024-03-28 DIAGNOSIS — M25569 Pain in unspecified knee: Secondary | ICD-10-CM | POA: Diagnosis not present

## 2024-03-30 DIAGNOSIS — M25569 Pain in unspecified knee: Secondary | ICD-10-CM | POA: Diagnosis not present

## 2024-04-04 DIAGNOSIS — M25569 Pain in unspecified knee: Secondary | ICD-10-CM | POA: Diagnosis not present

## 2024-04-06 DIAGNOSIS — H0231 Blepharochalasis right upper eyelid: Secondary | ICD-10-CM | POA: Diagnosis not present

## 2024-04-06 DIAGNOSIS — H02834 Dermatochalasis of left upper eyelid: Secondary | ICD-10-CM | POA: Diagnosis not present

## 2024-04-06 DIAGNOSIS — H02831 Dermatochalasis of right upper eyelid: Secondary | ICD-10-CM | POA: Diagnosis not present

## 2024-04-06 DIAGNOSIS — H0234 Blepharochalasis left upper eyelid: Secondary | ICD-10-CM | POA: Diagnosis not present

## 2024-04-11 ENCOUNTER — Encounter: Payer: Medicare HMO | Admitting: Family Medicine

## 2024-04-18 DIAGNOSIS — M25569 Pain in unspecified knee: Secondary | ICD-10-CM | POA: Diagnosis not present

## 2024-04-20 DIAGNOSIS — M25569 Pain in unspecified knee: Secondary | ICD-10-CM | POA: Diagnosis not present

## 2024-04-25 ENCOUNTER — Ambulatory Visit

## 2024-04-25 ENCOUNTER — Ambulatory Visit
Admission: RE | Admit: 2024-04-25 | Discharge: 2024-04-25 | Disposition: A | Discharge status: Home / Self Care | Attending: Family Medicine | Admitting: Family Medicine

## 2024-04-25 ENCOUNTER — Encounter: Payer: Self-pay | Admitting: Family Medicine

## 2024-04-25 VITALS — BP 147/94 | HR 71 | Temp 98.6°F | Resp 17

## 2024-04-25 DIAGNOSIS — S161XXA Strain of muscle, fascia and tendon at neck level, initial encounter: Secondary | ICD-10-CM

## 2024-04-25 DIAGNOSIS — M542 Cervicalgia: Secondary | ICD-10-CM

## 2024-04-25 DIAGNOSIS — M62838 Other muscle spasm: Secondary | ICD-10-CM

## 2024-04-25 DIAGNOSIS — M4183 Other forms of scoliosis, cervicothoracic region: Secondary | ICD-10-CM | POA: Diagnosis not present

## 2024-04-25 MED ORDER — METHOCARBAMOL 500 MG PO TABS: 500.0000 mg | ORAL_TABLET | Freq: Two times a day (BID) | ORAL | 0 refills | Status: DC | PRN

## 2024-04-25 MED ORDER — METHYLPREDNISOLONE SODIUM SUCC 125 MG IJ SOLR
125.0000 mg | Freq: Once | INTRAMUSCULAR | Status: AC
  Administered 2024-04-25: 125 mg via INTRAMUSCULAR

## 2024-04-25 MED ORDER — KETOROLAC TROMETHAMINE 60 MG/2ML IM SOLN
30.0000 mg | Freq: Once | INTRAMUSCULAR | Status: AC
  Administered 2024-04-25: 30 mg via INTRAMUSCULAR

## 2024-04-25 MED ORDER — OXYCODONE-ACETAMINOPHEN 5-325 MG PO TABS: 1.0000 | ORAL_TABLET | Freq: Three times a day (TID) | ORAL | 0 refills | Status: DC | PRN

## 2024-04-25 MED ORDER — PREDNISONE 10 MG (21) PO TBPK: ORAL_TABLET | Freq: Every day | ORAL | 0 refills | Status: DC

## 2024-04-25 NOTE — Discharge Instructions (Addendum)
 Advised patient husband of cervical spine x-ray results with hardcopy and image provided.  Advised patient take medications as directed with food to completion.  Advised may use Robaxin daily or as needed for company trapezius spasms/neck muscle spasms.  Advised may use Percocet for severe/acute neck pain.  Patient advised of sedative effects of this medication.  Encouraged to increase daily water intake to 64 ounces per day while taking these medications.  Advised if symptoms worsen and/or unresolved please follow-up with your PCP, [facility] or here for further evaluation.

## 2024-04-25 NOTE — Telephone Encounter (Signed)
 I would recommend maybe we schedule follow-up next week that way she can try the medications that he gave her this week and then we can see if she is improving and needs further workup

## 2024-04-25 NOTE — ED Triage Notes (Signed)
 Pt c/o neck pain x 2 days. Worsening since last night. Worse with movement. Heat/ice and ibuprofen prn with no relief. Hx of DDD.

## 2024-04-25 NOTE — ED Provider Notes (Signed)
 [facility]    CSN: [id number] Arrival date & time: 04/25/24  0806      History   Chief Complaint Chief Complaint  Patient presents with   Neck Pain    HPI Diane Chavez is a 68 y.o. female.   HPI Very pleasant 68 year old female presenting with worsening neck pain for 2 days.  PMH significant for anxiety, UC and HLD.  Patient is accompanied by her husband this morning.  Past Medical History:  Diagnosis Date   Anxiety    Crohn disease (HCC)    Dr. 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   DDD (degenerative disc disease), cervical 08/22/2021   Osteoarthritis of right acromioclavicular joint 07/26/2021   Memory change 02/05/2021   Lumbar degenerative disc disease 10/04/2018   Acute shoulder bursitis, left 05/26/2018   Tibial plateau fracture, left, meniscal tearing, osteoarthritis 09/30/2017   Status post cardiac surgery 09/05/2013   Epiretinal membrane 12/10/2012   Osteoporosis 10/10/2010   BENIGN POSITIONAL VERTIGO 04/05/2010   Regional enteritis (HCC) 07/30/2009   Headache 07/30/2009   Asymptomatic postmenopausal status 04/25/2009   Anxiety state 09/20/2007   Insomnia 09/20/2007    Past Surgical History:  Procedure Laterality Date   CARDIAC SURGERY     as a child   EYE SURGERY  10/30/2009   repair of detached retina   TONSILLECTOMY  06/17/1963   TUBAL LIGATION  1986    OB History   No obstetric history on file.      Home Medications    Prior to Admission medications   Medication Sig Start Date End Date Taking? Authorizing Provider  methocarbamol (ROBAXIN) 500 MG tablet Take 1 tablet (500 mg total) by mouth 2 (two) times daily as needed. 04/25/24  Yes Teddy Sharper, FNP  oxyCODONE-acetaminophen (PERCOCET/ROXICET) 5-325 MG tablet Take 1 tablet by mouth every 8 (eight) hours as  needed for severe pain (pain score 7-10). 04/25/24  Yes Teddy Sharper, FNP  predniSONE  (STERAPRED UNI-PAK 21 TAB) 10 MG (21) TBPK tablet Take by mouth daily. Take 6 tabs by mouth daily  for 2 days, then 5 tabs for 2 days, then 4 tabs for 2 days, then 3 tabs for 2 days, 2 tabs for 2 days, then 1 tab by mouth daily for 2 days 04/25/24  Yes Teddy Sharper, FNP  Calcium Carbonate-Vit D-Min (CALCIUM 1200 PO) Take by mouth.      [provider]  cholecalciferol (VITAMIN D ) 1000 units tablet Take 1,000 Units by mouth daily.    [provider]  dicyclomine (BENTYL) 10 MG capsule  12/18/21   [provider]  LUTEIN PO Take 1 tablet by mouth daily. Patient not taking: Reported on 02/24/2024    [provider]  Omega-3 Fatty Acids (OMEGA-3 CF) 1000 MG CAPS Take by mouth.    [provider]    Family History Family History  Problem Relation Age of Onset   Alzheimer's disease Mother     Social History Social History   Tobacco Use   Smoking status: Former    Current packs/day: 0.00    Types: Cigarettes    Quit date: 06/16/1973    Years since quitting: 50.8   Smokeless tobacco: Never   Tobacco comments:    I didn't inhale, just trying to  look cool for a little while.  Vaping Use   Vaping status: Never Used  Substance Use Topics   Alcohol use: Yes    Alcohol/week: 7.0 standard drinks of alcohol    Types: 7 Glasses of wine per week    Comment: one beer or one glass of wine most evenings   Drug use: No     Allergies   Patient has no known allergies.   Review of Systems Review of Systems  Musculoskeletal:  Positive for neck pain.  All other systems reviewed and are negative.    Physical Exam Triage Vital Signs ED Triage Vitals  Encounter Vitals Group     BP      Girls Systolic BP Percentile      Girls Diastolic BP Percentile      Boys Systolic BP Percentile      Boys Diastolic BP Percentile      Pulse      Resp      Temp      Temp  src      SpO2      Weight      Height      Head Circumference      Peak Flow      Pain Score      Pain Loc      Pain Education      Exclude from Growth Chart    No data found.  Updated Vital Signs BP (!) 147/94 (BP Location: Right Arm)   Pulse 71   Temp 98.6 F (37 C) (Oral)   Resp 17   LMP 06/11/2000   SpO2 98%    Physical Exam Vitals and nursing note reviewed.  Constitutional:      General: She is not in acute distress.    Appearance: Normal appearance. She is normal weight. She is not ill-appearing or diaphoretic.  HENT:     Head: Normocephalic and atraumatic.     Mouth/Throat:     Mouth: Mucous membranes are moist.     Pharynx: Oropharynx is clear.  Eyes:     Extraocular Movements: Extraocular movements intact.     Conjunctiva/sclera: Conjunctivae normal.     Pupils: Pupils are equal, round, and reactive to light.  Neck:     Comments: Posterior cervical spine: Patient reporting tenderness over C4/C5 area/paraspinous/splenius muscles, no deformity noted LROM with 4 planes of movement Cardiovascular:     Rate and Rhythm: Normal rate and regular rhythm.     Pulses: Normal pulses.     Heart sounds: Normal heart sounds. No murmur heard. Pulmonary:     Effort: Pulmonary effort is normal.     Breath sounds: Normal breath sounds. No wheezing, rhonchi or rales.  Musculoskeletal:        General: Normal range of motion.     Cervical back: Neck supple. Tenderness present. No rigidity.  Skin:    General: Skin is warm and dry.  Neurological:     General: No focal deficit present.     Mental Status: She is alert and oriented to person, place, and time. Mental status is at baseline.  Psychiatric:        Mood and Affect: Mood normal.        Behavior: Behavior normal.      UC Treatments / Results  Labs (all labs ordered are listed, but only abnormal results are displayed) Labs Reviewed - No data to display  EKG   Radiology DG Cervical Spine Complete Result  Date:  04/25/2024 EXAM: 6 VIEW(S) XRAY OF THE CERVICAL SPINE 04/25/2024 08:57:25 AM COMPARISON: Cervical spine radiographs 08/20/2021. CLINICAL HISTORY: 68 year old female. Neck pain x 2 days, history of cervical DDD. FINDINGS: BONES: No acute osseous abnormality. Chronic straightening of cervical lordosis and mild degenerative anterolisthesis at C4-C5, stable. Normally aligned posterior elements. Chronic dextroconvex cervical and levoconvex upper thoracic scoliosis is stable since 2023, mild to moderate. Associated widespread left side cervical facet hypertrophy. Maintained C1-C2 alignment. No aggressive appearing osseous lesion. DISCS AND DEGENERATIVE CHANGES: Chronic cervical disc and endplate degeneration maximal at C5-C6 appears stable. SOFT TISSUES: Prevertebral soft tissue contour remains normal. Stable asymmetric left apical lung scarring. IMPRESSION: 1. No acute osseous abnormality identified in the cervical spine. 2. Stable since 2023 radiographic appearance of chronic cervical spine degeneration superimposed on mild to moderate cervicothoracic scoliosis. Electronically signed by: Helayne Hurst MD 04/25/2024 09:15 AM EST RP Workstation: [id number]    Procedures Procedures (including critical care time)  Medications Ordered in UC Medications  ketorolac  (TORADOL ) injection 30 mg (30 mg Intramuscular Given 04/25/24 0846)  methylPREDNISolone  sodium succinate (SOLU-MEDROL ) 125 mg/2 mL injection 125 mg (125 mg Intramuscular Given 04/25/24 0847)    Initial Impression / Assessment and Plan / UC Course  I have reviewed the triage vital signs and the nursing notes.  Pertinent labs & imaging results that were available during my care of the patient were reviewed by me and considered in my medical decision making (see chart for details).     MDM: 1.  Neck pain-DG complete cervical spine revealed above, patient/husband advised, IM Toradol  30 mg given once in clinic, IM Solu-Medrol  125 mg given once in  clinic, Rx'd Percocet 5/325 mg tablet: Take 1 tablet every 8 hours for acute neck pain, advised patient/husband CT of neck most probable to evaluate further; 2.  Acute strain of neck muscle, initial encounter-Rx'd Sterapred Unipak (42 tab 10 mg taper) take as directed; 3.  Trapezius muscle spasm-Rx'd Robaxin 500 mg tablet: Take 2 tablets daily, as needed for accompanying muscle spasms of neck/trapezius. Advised patient husband of cervical spine x-ray results with hardcopy and image provided.  Advised patient take medications as directed with food to completion.  Advised may use Robaxin daily or as needed for company trapezius spasms/neck muscle spasms.  Advised may use Percocet for severe/acute neck pain.  Patient advised of sedative effects of this medication.  Encouraged to increase daily water intake to 64 ounces per day while taking these medications.  Advised if symptoms worsen and/or unresolved please follow-up with your PCP, [facility] or here for further evaluation. Final Clinical Impressions(s) / UC Diagnoses   Final diagnoses:  Neck pain  Acute strain of neck muscle, initial encounter  Trapezius muscle spasm     Discharge Instructions      Advised patient husband of cervical spine x-ray results with hardcopy and image provided.  Advised patient take medications as directed with food to completion.  Advised may use Robaxin daily or as needed for company trapezius spasms/neck muscle spasms.  Advised may use Percocet for severe/acute neck pain.  Patient advised of sedative effects of this medication.  Encouraged to increase daily water intake to 64 ounces per day while taking these medications.  Advised if symptoms worsen and/or unresolved please follow-up with your PCP, [facility] or here for further evaluation.     ED Prescriptions     Medication Sig Dispense Auth. Provider   predniSONE  (STERAPRED UNI-PAK 21 TAB) 10 MG (21) TBPK tablet Take by  mouth daily. Take  6 tabs by mouth daily  for 2 days, then 5 tabs for 2 days, then 4 tabs for 2 days, then 3 tabs for 2 days, 2 tabs for 2 days, then 1 tab by mouth daily for 2 days 42 tablet Teddy Sharper, FNP   methocarbamol (ROBAXIN) 500 MG tablet Take 1 tablet (500 mg total) by mouth 2 (two) times daily as needed. 30 tablet Euel Castile, FNP   oxyCODONE-acetaminophen (PERCOCET/ROXICET) 5-325 MG tablet Take 1 tablet by mouth every 8 (eight) hours as needed for severe pain (pain score 7-10). 15 tablet Topacio Cella, FNP      I have reviewed the PDMP during this encounter.   Teddy Sharper, FNP 04/25/24 [phone]

## 2024-04-30 DIAGNOSIS — M25569 Pain in unspecified knee: Secondary | ICD-10-CM | POA: Diagnosis not present

## 2024-05-02 ENCOUNTER — Ambulatory Visit: Admitting: Family Medicine

## 2024-05-02 ENCOUNTER — Encounter: Payer: Self-pay | Admitting: Family Medicine

## 2024-05-02 VITALS — BP 132/68 | HR 53 | Ht 69.0 in | Wt 128.0 lb

## 2024-05-02 DIAGNOSIS — S161XXA Strain of muscle, fascia and tendon at neck level, initial encounter: Secondary | ICD-10-CM

## 2024-05-02 NOTE — Progress Notes (Signed)
 Established Patient Office Visit  Patient ID: Diane Chavez, female    DOB: 07/20/1955  Age: 68 y.o. MRN: [id number] PCP: Alvan Dorothyann BIRCH, MD  Chief Complaint  Patient presents with   Follow-up    Pt was seen in UC on 11/7 for neck pain     Subjective:     HPI  Discussed the use of AI scribe software for clinical note transcription with the patient, who gave verbal consent to proceed.  History of Present Illness Diane Chavez is a 68 year old female with degenerative disc disease who presents with acute neck pain.  Acute cervical pain - Onset of severe neck pain began suddenly on Sunday night, approximately eight days ago - Pain is bilateral in the neck and radiates to the top of the head - Significant sleep disturbance due to discomfort when lying down - Difficulty moving her head secondary to pain - Pain is exacerbated by activities such as reading or using a cell phone - Pain is worsened when lying on a pillow; has experimented with different pillows to improve comfort - Recent physical therapy involving neck massage and pressure to knots may have exacerbated symptoms  Functional impact - Difficulty sleeping due to neck pain - Difficulty moving her head - Adjusted daily activities and habits to minimize discomfort, including limiting reading and cell phone use  Therapeutic interventions and response - Currently on a tapering dose of prednisone  with approximately 50% improvement in symptoms - Discontinued use of muscle relaxers and oxycodone - Using leftover hydrocodone from a previous procedure to aid sleep - Neck pain is now manageable but still present, especially when lying down  Imaging and prior evaluation - Evaluated at urgent care the morning after symptom onset - Cervical spine x-rays performed; process was painful due to inability to turn neck - Informed of pre-existing degenerative disc disease  ROS    Objective:     BP 132/68   Pulse (!)  53   Ht 5' 9 (1.753 m)   Wt 128 lb 0.6 oz (58.1 kg)   LMP 06/11/2000   SpO2 97%   BMI 18.91 kg/m     Physical Exam Vitals and nursing note reviewed.  Constitutional:      Appearance: Normal appearance.  HENT:     Head: Normocephalic and atraumatic.  Eyes:     Conjunctiva/sclera: Conjunctivae normal.  Pulmonary:     Effort: Pulmonary effort is normal.  Musculoskeletal:     Comments: Normal flexion, extension, dec rotation to left compared to right.  Come pain with extension and rotation.  Tender knots on the left trap.   Skin:    General: Skin is warm and dry.  Neurological:     Mental Status: She is alert.  Psychiatric:        Mood and Affect: Mood normal.      No results found for any visits on 05/02/24.    The 10-year ASCVD risk score (Arnett DK, et al., 2019) is: 6.8%    Assessment & Plan:   Problem List Items Addressed This Visit   None Visit Diagnoses       Acute strain of neck muscle, initial encounter    -  Primary       Assessment and Plan Assessment & Plan Neck pain with muscle spasm and cervical degenerative disc disease Acute neck pain exacerbation with muscle spasm, likely due to recent physical therapy massage. Pain improved with prednisone  taper but persists at baseline. Concerns  about physical therapy manipulation due to past adverse reactions. - Provided neck-specific exercises for home use. - Advised heating pad use for 5-10 minutes before stretches. - Recommended alternating heat and ice post-stretches based on comfort. - Instructed on shoulder circles and rolls to relieve muscle tension. - Advised ergonomic adjustments for reading and computer use. - Suggested experimenting with different pillow positions for neutral neck alignment during sleep.    Return if symptoms worsen or fail to improve.    Dorothyann Byars, MD [facility] & [facility] Medicine at [facility]

## 2024-05-23 ENCOUNTER — Encounter: Payer: Self-pay | Admitting: Family Medicine

## 2024-05-23 ENCOUNTER — Ambulatory Visit (INDEPENDENT_AMBULATORY_CARE_PROVIDER_SITE_OTHER): Admitting: Family Medicine

## 2024-05-23 ENCOUNTER — Other Ambulatory Visit (HOSPITAL_BASED_OUTPATIENT_CLINIC_OR_DEPARTMENT_OTHER): Payer: Self-pay | Admitting: Family Medicine

## 2024-05-23 VITALS — BP 130/64 | HR 63 | Ht 69.0 in | Wt 131.1 lb

## 2024-05-23 DIAGNOSIS — Z0000 Encounter for general adult medical examination without abnormal findings: Secondary | ICD-10-CM

## 2024-05-23 DIAGNOSIS — B079 Viral wart, unspecified: Secondary | ICD-10-CM | POA: Diagnosis not present

## 2024-05-23 DIAGNOSIS — Z1231 Encounter for screening mammogram for malignant neoplasm of breast: Secondary | ICD-10-CM

## 2024-05-23 NOTE — Progress Notes (Signed)
 Complete physical exam  Patient: Diane Chavez    DOB: 01-28-1956 68 y.o.   MRN: [id number]  Chief Complaint  Patient presents with   Annual Exam    Subjective:    Diane Chavez is a 68 y.o. female who presents today for a complete physical exam. She reports consuming a general diet. The patient does not participate in regular exercise at present. She generally feels well. She reports sleeping fairly well. She does have additional problems to discuss today.   Discussed the use of AI scribe software for clinical note transcription with the patient, who gave verbal consent to proceed.  She has 2 warts on her right hand on her fingers she would like frozen today  History of Present Illness    Most recent fall risk assessment:    05/23/2024    1:30 PM  Fall Risk   Falls in the past year? 1  Number falls in past yr: 0  Injury with Fall? 0  Risk for fall due to : No Fall Risks  Follow up Falls evaluation completed     Most recent depression screenings:    05/23/2024    1:30 PM 05/02/2024    8:49 AM  PHQ 2/9 Scores  PHQ - 2 Score 0 0        Patient Care Team: Alvan Dorothyann BIRCH, MD as PCP - General Candi Lonni BIRCH, MD (Gastroenterology)   ROS    Objective:    BP 130/64   Pulse 63   Ht 5' 9 (1.753 m)   Wt 131 lb 1.3 oz (59.5 kg)   LMP 06/11/2000   SpO2 97%   BMI 19.36 kg/m     Physical Exam Exam conducted with a chaperone present.  Constitutional:      Appearance: Normal appearance.  HENT:     Head: Normocephalic and atraumatic.     Right Ear: Tympanic membrane, ear canal and external ear normal.     Left Ear: Tympanic membrane, ear canal and external ear normal.     Nose: Nose normal.     Mouth/Throat:     Pharynx: Oropharynx is clear.  Eyes:     Extraocular Movements: Extraocular movements intact.     Conjunctiva/sclera: Conjunctivae normal.     Pupils: Pupils are equal, round, and reactive to light.  Neck:     Thyroid : No  thyromegaly.  Cardiovascular:     Rate and Rhythm: Normal rate and regular rhythm.  Pulmonary:     Effort: Pulmonary effort is normal.     Breath sounds: Normal breath sounds.  Chest:     Chest wall: No mass.  Breasts:    Right: Normal. No mass, nipple discharge or skin change.     Left: Normal. No mass, nipple discharge or skin change.  Abdominal:     General: Bowel sounds are normal.     Palpations: Abdomen is soft.     Tenderness: There is no abdominal tenderness.  Musculoskeletal:        General: No swelling.     Cervical back: Neck supple.  Lymphadenopathy:     Upper Body:     Right upper body: No supraclavicular, axillary or pectoral adenopathy.     Left upper body: No supraclavicular, axillary or pectoral adenopathy.  Skin:    General: Skin is warm and dry.  Neurological:     Mental Status: She is oriented to person, place, and time.  Psychiatric:  Mood and Affect: Mood normal.        Behavior: Behavior normal.       No results found for any visits on 05/23/24.       Assessment & Plan:    Routine Health Maintenance and Physical Exam Immunization History  Administered Date(s) Administered   Fluad Quad(high Dose 65+) 03/24/2022   Fluad Trivalent(High Dose 65+) 04/08/2023   INFLUENZA, HIGH DOSE SEASONAL PF 02/24/2024   Influenza,inj,Quad PF,6+ Mos 02/14/2017, 03/23/2019, 03/28/2020   Influenza,inj,quad, With Preservative 04/06/2018   Influenza-Unspecified 03/30/2013, 02/25/2014, 05/14/2015, 02/26/2016, 03/16/2021   PFIZER(Purple Top)SARS-COV-2 Vaccination 09/22/2019, 10/13/2019, 02/01/2021   PNEUMOCOCCAL CONJUGATE-20 02/05/2021   RSV,unspecified 06/04/2022   Td 06/16/2000, 10/31/2009   Td (Adult), 2 Lf Tetanus Toxid, Preservative Free 06/16/2000, 10/31/2009   Tdap 04/23/2020   Zoster Recombinant(Shingrix) 04/23/2020, 02/01/2021    Health Maintenance  Topic Date Due   COVID-19 Vaccine (4 - 2025-26 season) 02/15/2024   Mammogram  03/31/2024    Medicare Annual Wellness (AWV)  02/23/2025   Colonoscopy  08/07/2029   DTaP/Tdap/Td (6 - Td or Tdap) 04/23/2030   Pneumococcal Vaccine: 50+ Years  Completed   Influenza Vaccine  Completed   Bone Density Scan  Completed   Hepatitis C Screening  Completed   Zoster Vaccines- Shingrix  Completed   Meningococcal B Vaccine  Aged Out    Discussed health benefits of physical activity, and encouraged her to engage in regular exercise appropriate for her age and condition.  Problem List Items Addressed This Visit   None Visit Diagnoses       Wellness examination    -  Primary     Viral warts, unspecified type          Mammogram schedule Immunizations are up to date   Assessment and Plan Assessment & Plan   Cryotherapy Procedure Note  Pre-operative Diagnosis: 2 warts on Right hand   Post-operative Diagnosis: same   Locations: index and pinkie finger   Indications: irritaiton   Anesthesia: not required    Procedure Details  Patient informed of risks (permanent scarring, infection, light or dark discoloration, bleeding, infection, weakness, numbness and recurrence of the lesion) and benefits of the procedure and verbal informed consent obtained.  The areas are treated with liquid nitrogen therapy, frozen until ice ball extended 1-2 mm beyond lesion, allowed to thaw, and treated again. The patient tolerated procedure well.  The patient was instructed on post-op care, warned that there may be blister formation, redness and pain. Recommend OTC analgesia as needed for pain.  Condition: Stable  Complications: none.  Plan: 1. Instructed to keep the area dry and covered for 24-48h and clean thereafter. 2. Warning signs of infection were reviewed.   3. Recommended that the patient use OTC acetaminophen  as needed for pain.  4. Return PRN.    No follow-ups on file.    Dorothyann Byars, MD [facility] Primary [facility] & Sports Medicine at [facility]

## 2024-05-24 ENCOUNTER — Encounter (HOSPITAL_BASED_OUTPATIENT_CLINIC_OR_DEPARTMENT_OTHER): Payer: Self-pay

## 2024-05-24 ENCOUNTER — Ambulatory Visit (HOSPITAL_BASED_OUTPATIENT_CLINIC_OR_DEPARTMENT_OTHER)
Admission: RE | Admit: 2024-05-24 | Discharge: 2024-05-24 | Disposition: A | Source: Ambulatory Visit | Attending: Family Medicine | Admitting: Family Medicine

## 2024-05-24 ENCOUNTER — Inpatient Hospital Stay (HOSPITAL_BASED_OUTPATIENT_CLINIC_OR_DEPARTMENT_OTHER): Admission: RE | Admit: 2024-05-24

## 2024-05-24 DIAGNOSIS — Z1231 Encounter for screening mammogram for malignant neoplasm of breast: Secondary | ICD-10-CM

## 2024-05-30 ENCOUNTER — Ambulatory Visit: Payer: Self-pay | Admitting: Family Medicine

## 2024-05-30 NOTE — Progress Notes (Signed)
 Please call patient. Normal mammogram.  Repeat in 1 year.

## 2024-07-15 ENCOUNTER — Ambulatory Visit
Admission: RE | Admit: 2024-07-15 | Discharge: 2024-07-15 | Disposition: A | Discharge status: Home / Self Care | Attending: Family Medicine | Admitting: Family Medicine

## 2024-07-15 VITALS — BP 119/77 | HR 70 | Temp 99.1°F | Resp 17

## 2024-07-15 DIAGNOSIS — J209 Acute bronchitis, unspecified: Secondary | ICD-10-CM

## 2024-07-15 DIAGNOSIS — J069 Acute upper respiratory infection, unspecified: Secondary | ICD-10-CM | POA: Diagnosis not present

## 2024-07-15 MED ORDER — AZITHROMYCIN 250 MG PO TABS: ORAL_TABLET | ORAL | 0 refills | Status: AC

## 2024-07-15 NOTE — ED Triage Notes (Signed)
 Pt reports that she has been having a cough, sinus drainage, body aches, and fatigue x 5 days.  Pt has been taking ibuprofen at home with little relief, last dose was Wednesday.

## 2024-07-15 NOTE — Discharge Instructions (Signed)
 Take plain guaifenesin (1200mg  extended release tabs such as Mucinex) twice daily, with plenty of water, for cough and congestion.  Get adequate rest.  Check temperature daily. May take Delsym Cough Suppressant (12 Hour Cough Relief) at bedtime for nighttime cough.  Stop all antihistamines for now, and other non-prescription cough/cold preparations.  If symptoms become significantly worse during the night or over the weekend, proceed to the local emergency room.
